# Patient Record
Sex: Male | Born: 1935
Health system: Southern US, Community
[De-identification: ages and names within clinical notes are randomized; demographics above are authoritative.]

## PROBLEM LIST (undated history)

## (undated) DIAGNOSIS — R001 Bradycardia, unspecified: Secondary | ICD-10-CM

## (undated) DIAGNOSIS — Z720 Tobacco use: Secondary | ICD-10-CM

## (undated) DIAGNOSIS — E785 Hyperlipidemia, unspecified: Secondary | ICD-10-CM

## (undated) DIAGNOSIS — N289 Disorder of kidney and ureter, unspecified: Secondary | ICD-10-CM

## (undated) DIAGNOSIS — I2510 Atherosclerotic heart disease of native coronary artery without angina pectoris: Secondary | ICD-10-CM

## (undated) DIAGNOSIS — I4819 Other persistent atrial fibrillation: Secondary | ICD-10-CM

## (undated) DIAGNOSIS — I447 Left bundle-branch block, unspecified: Secondary | ICD-10-CM

## (undated) DIAGNOSIS — I219 Acute myocardial infarction, unspecified: Secondary | ICD-10-CM

## (undated) DIAGNOSIS — I255 Ischemic cardiomyopathy: Secondary | ICD-10-CM

## (undated) DIAGNOSIS — I10 Essential (primary) hypertension: Secondary | ICD-10-CM

## (undated) DIAGNOSIS — M1990 Unspecified osteoarthritis, unspecified site: Secondary | ICD-10-CM

## (undated) DIAGNOSIS — R0600 Dyspnea, unspecified: Secondary | ICD-10-CM

## (undated) DIAGNOSIS — N183 Chronic kidney disease, stage 3 unspecified: Secondary | ICD-10-CM

## (undated) DIAGNOSIS — I5020 Unspecified systolic (congestive) heart failure: Secondary | ICD-10-CM

## (undated) HISTORY — DX: Hyperlipidemia, unspecified: E78.5

## (undated) HISTORY — PX: JOINT REPLACEMENT: SHX530

## (undated) HISTORY — DX: Disorder of kidney and ureter, unspecified: N28.9

## (undated) HISTORY — PX: TOTAL HIP ARTHROPLASTY: SHX124

## (undated) HISTORY — DX: Ischemic cardiomyopathy: I25.5

## (undated) HISTORY — DX: Unspecified systolic (congestive) heart failure: I50.20

## (undated) HISTORY — DX: Atherosclerotic heart disease of native coronary artery without angina pectoris: I25.10

## (undated) HISTORY — DX: Essential (primary) hypertension: I10

## (undated) HISTORY — DX: Chronic kidney disease, stage 3 unspecified: N18.30

## (undated) HISTORY — DX: Tobacco use: Z72.0

## (undated) HISTORY — DX: Other persistent atrial fibrillation: I48.19

## (undated) HISTORY — DX: Left bundle-branch block, unspecified: I44.7

## (undated) HISTORY — PX: TRANSURETHRAL RESECTION OF PROSTATE: SHX73

## (undated) HISTORY — DX: Bradycardia, unspecified: R00.1

---

## 2004-12-26 HISTORY — PX: CARDIAC CATHETERIZATION: SHX172

## 2005-11-19 ENCOUNTER — Inpatient Hospital Stay (HOSPITAL_COMMUNITY): Admission: EM | Admit: 2005-11-19 | Discharge: 2005-11-24 | Payer: Self-pay | Admitting: Emergency Medicine

## 2005-11-21 ENCOUNTER — Ambulatory Visit: Payer: Self-pay | Admitting: Cardiology

## 2005-12-05 ENCOUNTER — Ambulatory Visit: Payer: Self-pay | Admitting: Cardiology

## 2005-12-15 ENCOUNTER — Ambulatory Visit: Payer: Self-pay | Admitting: Cardiology

## 2006-02-09 ENCOUNTER — Ambulatory Visit: Payer: Self-pay | Admitting: Cardiology

## 2006-02-16 ENCOUNTER — Ambulatory Visit: Payer: Self-pay | Admitting: Cardiology

## 2006-04-27 ENCOUNTER — Ambulatory Visit: Payer: Self-pay | Admitting: Cardiology

## 2006-06-06 ENCOUNTER — Ambulatory Visit: Payer: Self-pay | Admitting: Cardiology

## 2006-06-06 ENCOUNTER — Ambulatory Visit: Payer: Self-pay

## 2006-11-28 ENCOUNTER — Ambulatory Visit: Payer: Self-pay | Admitting: Cardiology

## 2006-12-26 HISTORY — PX: CARDIAC CATHETERIZATION: SHX172

## 2007-03-28 ENCOUNTER — Other Ambulatory Visit: Payer: Self-pay

## 2007-03-28 ENCOUNTER — Ambulatory Visit: Payer: Self-pay | Admitting: Unknown Physician Specialty

## 2007-04-03 ENCOUNTER — Inpatient Hospital Stay: Payer: Self-pay | Admitting: Unknown Physician Specialty

## 2007-04-03 ENCOUNTER — Ambulatory Visit: Payer: Self-pay | Admitting: Cardiology

## 2007-04-04 ENCOUNTER — Other Ambulatory Visit: Payer: Self-pay

## 2007-04-05 ENCOUNTER — Other Ambulatory Visit: Payer: Self-pay

## 2007-04-08 ENCOUNTER — Other Ambulatory Visit: Payer: Self-pay

## 2007-04-10 ENCOUNTER — Encounter: Payer: Self-pay | Admitting: Internal Medicine

## 2007-04-23 ENCOUNTER — Ambulatory Visit: Payer: Self-pay | Admitting: Cardiology

## 2007-04-26 ENCOUNTER — Encounter: Payer: Self-pay | Admitting: Internal Medicine

## 2007-11-28 ENCOUNTER — Ambulatory Visit: Payer: Self-pay | Admitting: Unknown Physician Specialty

## 2007-11-28 ENCOUNTER — Other Ambulatory Visit: Payer: Self-pay

## 2007-12-06 ENCOUNTER — Inpatient Hospital Stay: Payer: Self-pay | Admitting: Unknown Physician Specialty

## 2007-12-12 ENCOUNTER — Ambulatory Visit: Payer: Self-pay | Admitting: Cardiovascular Disease

## 2007-12-12 ENCOUNTER — Ambulatory Visit: Payer: Self-pay | Admitting: Cardiology

## 2007-12-12 ENCOUNTER — Inpatient Hospital Stay (HOSPITAL_COMMUNITY): Admission: EM | Admit: 2007-12-12 | Discharge: 2007-12-16 | Payer: Self-pay | Admitting: Cardiovascular Disease

## 2007-12-12 ENCOUNTER — Encounter: Payer: Self-pay | Admitting: Cardiovascular Disease

## 2008-01-02 ENCOUNTER — Ambulatory Visit: Payer: Self-pay | Admitting: Cardiology

## 2008-01-28 ENCOUNTER — Ambulatory Visit: Payer: Self-pay | Admitting: Cardiology

## 2008-02-29 ENCOUNTER — Encounter: Payer: Self-pay | Admitting: Cardiology

## 2008-05-14 ENCOUNTER — Ambulatory Visit: Payer: Self-pay

## 2008-05-22 ENCOUNTER — Ambulatory Visit: Payer: Self-pay | Admitting: Cardiology

## 2008-06-12 ENCOUNTER — Ambulatory Visit: Payer: Self-pay | Admitting: Cardiology

## 2008-06-12 LAB — CONVERTED CEMR LAB
ALT: 11 units/L (ref 0–53)
AST: 13 units/L (ref 0–37)
Albumin: 4 g/dL (ref 3.5–5.2)
Alkaline Phosphatase: 55 units/L (ref 39–117)
BUN: 20 mg/dL (ref 6–23)
CO2: 22 meq/L (ref 19–32)
Calcium: 8.7 mg/dL (ref 8.4–10.5)
Chloride: 106 meq/L (ref 96–112)
Creatinine, Ser: 1.24 mg/dL (ref 0.40–1.50)
Glucose, Bld: 89 mg/dL (ref 70–99)
Potassium: 4.3 meq/L (ref 3.5–5.3)
Sodium: 141 meq/L (ref 135–145)
Total Bilirubin: 0.7 mg/dL (ref 0.3–1.2)
Total Protein: 6.8 g/dL (ref 6.0–8.3)

## 2008-08-27 ENCOUNTER — Ambulatory Visit: Payer: Self-pay | Admitting: Unknown Physician Specialty

## 2009-03-23 ENCOUNTER — Encounter: Payer: Self-pay | Admitting: Cardiology

## 2009-03-23 ENCOUNTER — Ambulatory Visit: Payer: Self-pay | Admitting: Cardiovascular Disease

## 2009-03-23 LAB — CONVERTED CEMR LAB
ALT: 14 units/L (ref 0–53)
AST: 14 units/L (ref 0–37)
Albumin: 4.1 g/dL (ref 3.5–5.2)
Alkaline Phosphatase: 54 units/L (ref 39–117)
Bilirubin, Direct: 0.1 mg/dL (ref 0.0–0.3)
Cholesterol: 98 mg/dL (ref 0–200)
HDL: 29 mg/dL — ABNORMAL LOW (ref >39)
Indirect Bilirubin: 0.5 mg/dL (ref 0.0–0.9)
LDL Cholesterol: 43 mg/dL (ref 0–99)
Total Bilirubin: 0.6 mg/dL (ref 0.3–1.2)
Total CHOL/HDL Ratio: 3.4
Total Protein: 6.9 g/dL (ref 6.0–8.3)
Triglycerides: 131 mg/dL (ref <150)
VLDL: 26 mg/dL (ref 0–40)

## 2009-04-07 ENCOUNTER — Ambulatory Visit: Payer: Self-pay | Admitting: Cardiology

## 2009-04-16 ENCOUNTER — Encounter: Payer: Self-pay | Admitting: Cardiology

## 2009-04-16 ENCOUNTER — Ambulatory Visit: Payer: Self-pay

## 2009-10-07 ENCOUNTER — Ambulatory Visit: Payer: Self-pay | Admitting: Cardiology

## 2009-10-07 DIAGNOSIS — I252 Old myocardial infarction: Secondary | ICD-10-CM

## 2009-10-07 DIAGNOSIS — I2510 Atherosclerotic heart disease of native coronary artery without angina pectoris: Secondary | ICD-10-CM

## 2009-10-07 DIAGNOSIS — I10 Essential (primary) hypertension: Secondary | ICD-10-CM | POA: Insufficient documentation

## 2009-10-07 DIAGNOSIS — E785 Hyperlipidemia, unspecified: Secondary | ICD-10-CM

## 2009-10-14 ENCOUNTER — Ambulatory Visit: Payer: Self-pay | Admitting: Cardiology

## 2009-10-14 ENCOUNTER — Encounter: Payer: Self-pay | Admitting: Cardiology

## 2009-10-16 LAB — CONVERTED CEMR LAB
ALT: 13 units/L (ref 0–53)
AST: 15 units/L (ref 0–37)
Albumin: 4 g/dL (ref 3.5–5.2)
Alkaline Phosphatase: 52 units/L (ref 39–117)
BUN: 26 mg/dL — ABNORMAL HIGH (ref 6–23)
CO2: 23 meq/L (ref 19–32)
Calcium: 9 mg/dL (ref 8.4–10.5)
Chloride: 106 meq/L (ref 96–112)
Cholesterol: 120 mg/dL (ref 0–200)
Creatinine, Ser: 1.58 mg/dL — ABNORMAL HIGH (ref 0.40–1.50)
Glucose, Bld: 92 mg/dL (ref 70–99)
HDL: 30 mg/dL — ABNORMAL LOW (ref >39)
LDL Cholesterol: 54 mg/dL (ref 0–99)
Potassium: 5 meq/L (ref 3.5–5.3)
Sodium: 141 meq/L (ref 135–145)
Total Bilirubin: 0.5 mg/dL (ref 0.3–1.2)
Total CHOL/HDL Ratio: 4
Total Protein: 6.5 g/dL (ref 6.0–8.3)
Triglycerides: 180 mg/dL — ABNORMAL HIGH (ref <150)
VLDL: 36 mg/dL (ref 0–40)

## 2009-11-10 ENCOUNTER — Encounter: Payer: Self-pay | Admitting: Cardiology

## 2010-06-15 ENCOUNTER — Encounter: Payer: Self-pay | Admitting: Cardiology

## 2010-06-22 ENCOUNTER — Encounter: Payer: Self-pay | Admitting: Cardiology

## 2011-01-25 NOTE — Letter (Signed)
Summary: [facility]   Imported By: Zenovia Jarred 08/09/2010 11:01:09  _____________________________________________________________________  External Attachment:    Type:   Image     Comment:   External Document

## 2011-05-10 NOTE — H&P (Signed)
[name] [name], MONNOT NO.:  [id number]   MEDICAL RECORD NO.:  [id number]          PATIENT TYPE:  INP   LOCATION:  2807                         FACILITY:  [facility]   PHYSICIAN:  Juanda Bond. Burt Knack, MD  DATE OF BIRTH:  1931/10/17   DATE OF ADMISSION:  12/12/2007  DATE OF DISCHARGE:                              HISTORY & PHYSICAL   PRIMARY CARDIOLOGIST:  Dr. Jenell Milliner.   PRIMARY CARE PHYSICIAN:  [name] Lab, MD.   ORTHOPEDIC SURGEON:  Dr. Jefm Bryant.   HISTORY OF PRESENT ILLNESS:  A 77 year old, Caucasian male with known  history of coronary artery disease, non-ST elevated MI in 2006 with a  Cypher stent to the mid LAD per catheterization in 2006 who presents to  [name] with acute anterolateral MI.  The patient began to  have left-sided chest pain while getting into the bathtub this a.m.  The  patient of note has had recent right hip replacement approximately 7  days ago and is unable to shower and was getting in and out of the  bathtub causing some discomfort in his chest.  The patient had been off  of Plavix and aspirin and been taking subcu Lovenox postoperatively.  He  had not taken his Lovenox this morning.  The patient admits to having  some twinges in the left side of his chest a couple of days ago that  were transient with no associated symptoms.  This morning, the left-  sided chest pain became very severe with radiation to the left arm with  diaphoresis and mild shortness of breath.  EMS was called.  EKG revealed  ST elevation in leads V2 through V5 with reciprocal changes in 1, 2, and  3.  The patient was given sublingual nitroglycerin, 325 mg of baby  aspirin, and morphine by EMS and brought emergently to [name] at [facility].  The patient was seen and examined by  myself and Dr. Sherren Mocha in the Cath [name].   REVIEW OF SYSTEMS:  Positive for diaphoresis, chest pain, shortness of  breath, and chronic right hip  pain.   PAST MEDICAL HISTORY:  1. Coronary artery disease.      a.     Stent of the mid LAD with Cypher stent to the mid section in       November 2006.      b.     Cardiac catheterization at that time revealed a 40% ostial       LAD, 95% mid LAD and second diagonal, 70% second diagonal branch,       40% posterolateral vessels of the circumflex, and 40% right       coronary artery mid and distal vessels.  2. Hypertension.  3. Hyperlipidemia.  4. Mild renal insufficiency with baseline creatinine of 1.5.  5. Asymptomatic sinus bradycardia.  6. BPH.   PAST SURGICAL HISTORY:  TURP and status post right hip replacement.   SOCIAL HISTORY:  He lives in [place] with his wife.  He is retired  from [employer].  He is married.  He stopped smoking  40 years ago.  No ETOH.   CURRENT MEDICATIONS:  1. Aspirin 325 once a day.  2. Plavix 75 mg once a day (currently on hold).  3. Norvasc 5 mg daily.  4. Toprol XL 25 mg daily.  5. Vytorin 10/20 once a day.  6. Lovenox subcutaneously once a day.   ALLERGIES:  No known drug allergies.   PHYSICAL EXAMINATION:  VITAL SIGNS:  Blood pressure 140/74, pulse 89,  respirations 12.  The patient weighed 190 pounds, height 5 feet 7.  GENERAL:  He is awake, alert, oriented, and complaining of severe chest  pain.  HEENT:  Head is normocephalic atraumatic.  Eyes PERRLA.  Mucous  membranes are now pink and moist.  Tongue is midline.  NECK:  Supple with no JVD.  No carotid bruits appreciated at present.  CARDIOVASCULAR:  Tachycardic with regular rhythm and rate without  murmurs, rubs, or gallops at present.  LUNGS:  Have some bibasilar crackles without wheezes or rhonchi.  ABDOMEN:  Soft and nontender.  There is a ventral hernia noted.  He has  2+ bowel sounds.  EXTREMITIES:  Without clubbing, cyanosis, or edema.  There is fresh  right hip incision noted status post right hip replacement.  NEURO:  Cranial nerves II-XII are grossly intact.   Labs are pending.   EKG revealing ST elevation V1 through V5 with  reciprocal changes in 1, 2, and 3.   IMPRESSION:  1. ST elevated myocardial infarction anterolateral.  2. Status post right hip replacement 1 week ago.  3. Known history of coronary artery disease with stent to the left      anterior descending (mid) with Cypher stent in November 2006 with      circumflex and right coronary artery disease nonobstructively at      that time.  4. History of hypertension.  5. Mild renal insufficiency with baseline creatinine of 1.5.  6. Asymptomatic sinus bradycardia.   PLAN:  The patient is being seen emergently at [name] for emergent cardiac catheterization and PCI to  correct culprit lesion occlusion.  More recommendations per Dr. Burt Knack.  Please see his thorough cardiac catheterization note for more details.      [name] [name]. [name], NP      Juanda Bond. Burt Knack, MD  Electronically Signed    KML/MEDQ  D:  12/12/2007  T:  12/12/2007  Job:  [id number]   cc:   [name] Kail, MD  [name] Lab, MD

## 2011-05-10 NOTE — Assessment & Plan Note (Signed)
[facility] OFFICE NOTE   NAME:[name], [name] PHIBBS                      MRN:          [id number]  DATE:01/28/2008                            DOB:          1931-03-12    [name] [name] returns today for close follow-up.  He had an acute anterior  wall infarct as outlined on my note January 02, 2008, December 06, 2007.  He had a bare metal stent placed secondary to stent thrombosis from a  previous drug-eluting stent.   He has done extremely well.  He is not having any symptoms of angina or  ischemia.   His biggest problem is orthopedic issues.  His hip surgery has healed up  well but he is now having significant left knee pain.   He remains on plastic Plavix and aspirin, will stay on those for a year.  He is also on Carvedilol 6.25 b.i.d., Norvasc 5 mg a day, Vytorin 10/20.   He is going to see Dr. Caryl Comes soon for blood work.  He will need a follow-  up CBC secondary to his postoperative anemia.  He is on iron  replacement.   Blood pressure is 124/70 his pulse 58 and regular.  Weight is 186.  HEENT:  Unchanged.  LUNGS:  Clear.  HEART:  Reveals a nondisplaced PMI.  Normal S1-S2.  No gallop.  ABDOMEN:  Soft, good bowel sounds.  EXTREMITIES:  No edema.  Pulses are present.  NEURO:  Exam is intact.   ASSESSMENT/PLAN:  [name] [name] is doing well.  He will need a follow-up  CBC with Dr. Caryl Comes and he can finish out his iron therapy.  He will stay  on Plavix and aspirin for a year as outlined above.  We will obtain  adenosine Myoview to follow-up on his stent and post MI in April.  I  will see him back shortly after that.     Thomas C. Verl Blalock, MD, [facility]  Electronically Signed    TCW/MedQ  DD: 01/28/2008  DT: 01/28/2008  Job #: [id number]   cc:   Dr. Ramonita Lab

## 2011-05-10 NOTE — Assessment & Plan Note (Signed)
[facility] OFFICE NOTE   NAME:[name] [name], [name] [name]                      MRN:          [id number]  DATE:04/07/2009                            DOB:          11/03/1931    [name] comes in today.  He was supposed to be here in December, but  he is a bit late.   He seems somewhat lackadaisical today, has actually gained about 14  pounds.  He is not depressed.  He has been getting out and playing some  golf.   He complains of some funny feelings in his left upper arm at rest.  He  has not had any exertional angina or ischemic equivalents.  He denies  orthopnea, PND, or peripheral edema.  He is not having any major dyspnea  on exertion.   He is over a year out from his acute anterior wall infarct at the time  of hip surgery when his Plavix was discontinued.  A bare-metal stent  placed at that time.  Followup stress Myoview showed an EF of 38% with  no ischemia, but a fairly large anterior apical scar.  His EF had been  about 40-45%, but on the stress Myoview was 38%.   MEDICATIONS:  1. Plavix 75 mg per day.  2. Norvasc 7.5 mg per day.  3. Carvedilol 6.25 b.i.d.  4. Aspirin 325 mg daily.  5. Lisinopril 10 mg per day.  6. Simvastatin 40 mg at bedtime.  7. Nitroglycerin, which he has not refilled in over a year.   PHYSICAL EXAMINATION:  VITAL SIGNS:  Blood pressure is 114/60, his pulse  57 and regular.  His electrocardiogram shows sinus brady with a left  anterior fascicular block and sort of a nonspecific block in general.  There has been no significant change.  His STs have more or less  normalized in the anterior septal leads except for some biphasic Ts.  SKIN:  Warm and dry.  GENERAL:  He is alert and oriented x3.  HEENT:  Ruddy complexion, otherwise normal.  NECK:  Supple.  There is no JVD.  Carotid upstrokes were equal  bilaterally without bruits.  Thyroid is not enlarged.  Trachea is  midline.  LUNGS:   Clear to auscultation and percussion.  HEART:  Poorly-appreciated PMI.  Soft S1 and S2.  No gallop.  ABDOMEN:  Soft, good bowel sounds.  No midline bruit.  No hepatomegaly.  EXTREMITIES:  No cyanosis, clubbing, or edema.  Pulses are intact.  NEURO:  Intact.  MUSCULOSKELETAL:  Chronic arthritic changes.   [name] is stable, though I am concerned about his weight gain as  well as his lackadaisical attitude about his health care.  The fact that  he has not had his nitroglycerin renewed and the fact that he is late  for showing back up for his annual visit is disturbing.  I do not think  his discomfort is angina or ischemic equivalent.   PLAN:  1. We renewed his Carvedilol today at 6.25 b.i.d.  2. We renewed his nitroglycerin.  3. A 2-D echocardiogram to  follow up on his LV function and also      evaluate for any mitral valve disease.   Assuming he is stable, I will see him back in 6 months.     Thomas C. Verl Blalock, MD, [facility]  Electronically Signed    TCW/MedQ  DD: 04/07/2009  DT: 04/08/2009  Job #: [id number]   cc:   Ramonita Lab, III, MD

## 2011-05-10 NOTE — Assessment & Plan Note (Signed)
[facility] OFFICE NOTE   NAME:[name], [name] BUKHARI                      MRN:          [id number]  DATE:05/22/2008                            DOB:          02-Nov-1931    [name] [name] returns today for followup.   He is now 5 months out from his acute anterior wall infarct, status post  bare-metal stent.  His previous notes have outlined this is in the  setting of discontinuation of Plavix for hip surgery.  He had a previous  drug-eluting stent in that vessel.   I did a stress Myoview, May 14, 2008.  His ejection fraction was 38%  with anterior apical and inferior apical hypokinesia.  He had a fixed  defect in that area as well as a small inferior wall scar.  There was no  ischemia.  His previous ejection fraction was about 40% to 45%.  This  was probably consistent.   He is having no symptoms of congestive heart failure at present.  He  specifically denies any orthopnea, PND or peripheral edema.  His weight  is up and he is trying to lose.  He is up from 186 to 193.   His blood pressure is excellent today at 128/75.  His pulse is 50 and  regular.  He denies any syncope or presyncope.  NECK:  Shows no JVD.  LUNGS:  Clear.  HEART:  Reveals a regular rate and rhythm, no gallop.  ABDOMINAL EXAM:  Slightly protuberant, good bowel sounds.  There is no  organomegaly.  Bowel sounds are present.  EXTREMITIES:  Reveal no cyanosis, clubbing, or edema.  Pulses are  present.  NEURO EXAM:  Intact.  SKIN:  Unremarkable.  MUSCULOSKELETAL:  Chronic arthritic changes.   I have had a nice chat with [name] [name] today.  We will add lisinopril  10 mg a day for reducing progressive left ventricular systolic  dysfunction and congestive heart failure.  He will remain on his  carvedilol 6.25 b.i.d.  In addition, he will continue aspirin and  Plavix.  I will see him back in December 2009.  We can make a final  decision about continuing  Plavix at that time, but both of [place] are  leaning towards continuing to do so with his past history.     Thomas C. Verl Blalock, MD, [facility]  Electronically Signed    TCW/MedQ  DD: 05/22/2008  DT: 05/22/2008  Job #: [id number]   cc:   Tama High, III, MD

## 2011-05-10 NOTE — Assessment & Plan Note (Signed)
[facility] OFFICE NOTE   NAME:[name] [name]                      MRN:          [id number]  DATE:01/02/2008                            DOB:          1931/07/31    [name] [name] returns today after being discharged from the hospital on  December 16, 2007, status post hip replacement, acute anterior  myocardial infarction.  Of note, his Plavix had been stopped  appropriately for his hip replacement on December 06, 2007.  It had been  almost 2 years since his previous stent.   His catheterization showed a stent thrombosis in the LAD, nonobstructive  disease in the circumflex in the right coronary artery.  He had a bare-  metal stent placed.  His EF was about 45% with anterior apical wall  motion abnormalities.  There was a question on echo of a possible apical  thrombus.   We planned to put him on Coumadin, but he had guaiac positive stools.  Guaiac cards have been returned since discharge, and they are negative.  He is on iron replacement, and a proton pump inhibitor.   We also were not too enthralled with the idea of having him on Coumadin  with Plavix and aspirin.   He required transfusion in the hospital.  Since discharge, he has done  well, except for some sharp pain in his left back yesterday.  He did  some unusual movement replacing a computer modem, and probably pulled a  muscle.  It is better today.  He denies any GU symptomatology.  He does  have a history of kidney stones, however.   CURRENT MEDICATIONS:  1. Carvedilol 6.25 mg p.o. b.i.d.  2. Protonix 40 mg a day.  3. Aspirin 325 mg a day.  4. Iron 325 mg a day.  5. Colace 100 mg 1 or 2 a day.  6. Plavix 75 mg a day.  7. Norvasc 5 mg a day.  8. Vytorin 10/20 daily.  9. Nitroglycerin p.r.n.   He looks remarkably well today.  Skin color is good.  He is alert and oriented x3.  His blood pressure is 116/68.  His pulse is 66.  He is in sinus  rhythm.  EKG:  Shows evolutionary changes of an anterior apical infarct.  His weight is 191, down 4.  HEENT:  Unchanged.  Carotid upstrokes were equal bilaterally without bruits.  There was no  JVD.  Thyroid is not enlarged.  Trachea is midline.  LUNGS:  Clear to auscultation.  HEART:  Reveals a nondisplaced, but poorly appreciated PMI.  There is no  S3 gallop.  S2 splits physiologically.  ABDOMINAL EXAM:  Protuberant with good bowel sounds.  There is no  tenderness.  Catheterization sites are healed.  EXTREMITIES:  Reveal 1+ on the right, trace on the left.  Pulses are  intact.  NEUROLOGIC EXAM:  Intact.   ASSESSMENT:  [name] [name] is doing well.  I have made no changes in his  medical program.  We will plan on seeing him back in 4 weeks.  He has  been advised  to stay on the Plavix and the aspirin for a year together,  then aspirin thereafter.  In addition, he needs to be on the iron for a  couple of months.  He has been educated on the warning signs of melena  as opposed to just dark stools from iron.     [name] C. Verl Blalock, MD, [facility]  Electronically Signed    TCW/MedQ  DD: 01/02/2008  DT: 01/02/2008  Job #: [id number]   cc:   [name] [name], M.D.

## 2011-05-10 NOTE — Discharge Summary (Signed)
[name] [name], [name] [name]               ACCOUNT NO.:  [id number]   MEDICAL RECORD NO.:  [id number]          PATIENT TYPE:  INP   LOCATION:  [id number]                         FACILITY:  [place]   PHYSICIAN:  Marijo Conception. Wall, MD, FACCDATE OF BIRTH:  1931-04-20   DATE OF ADMISSION:  12/12/2007  DATE OF DISCHARGE:  12/16/2007                               DISCHARGE SUMMARY   PROCEDURES:  1. Cardiac catheterization.  2. Coronary arteriogram.  3. Left ventriculogram.  4. Percutaneous transluminal coronary angioplasty intravascular      ultrasound and stenting of the left anterior descending.  5. 2-D echocardiogram.   TIME AT DISCHARGE:  48 minutes.   HOSPITAL COURSE:  [name] [name] is a 77 year old male with a history of  coronary artery disease.  He was last seen by cardiology in April 2008  and was doing well.  He had a hip replacement on December 06, 2007.  He  was partial weightbearing and was getting into the bathtub on the day of  admission when he developed chest pain.  He had been off Plavix and  aspirin, and on subcutaneous Lovenox, but had not taken his Lovenox that  day.  A left-sided chest pain became severe, and EMS was called.  He had  ST elevation in leads V3 through V5, and he was brought urgently to the  hospital and taken to the cath lab.   The cardiac catheterization showed an LAD with stent thrombosis and mild  nonobstructive disease in the circumflex and RCA.  He had intravascular  ultrasound as well as percutaneous intervention to the LAD with a bare-  metal stent, reducing the stenosis to 0.  His EF was 40% to 45% by cath.  An echocardiogram was ordered to further assess this.   The echocardiogram showed an EF of 40% to 45% with periapical and septal  wall motion abnormalities.  There was also a possible apical thrombus.  His PAS was 53.   Initially, the plan was for heparin and Coumadin, but [name] [name] had  guaiac-positive stool and his hemoglobin was remaining very low.   At  discharge, he was 9.6 with a hematocrit of 28.  After the heart  catheterization, when his hemoglobin initially dropped, he was  transfused 1 unit.  His hemoglobin at this time is holding steady at  approximately 9.5.  She was discharged with stool guaiac cards, and is  to use these, and turn them into his family physician.  Of note, because  of hyperglycemia, a hemoglobin A1c was checked, but was within normal  limits.  The hyperglycemia resolved.   A lipid profile was checked and showed a total cholesterol of 100,  triglycerides 97, HDL 22, LDL 59.  Initially, he was placed on Lipitor  80 mg a day, but it was felt that he could continue on the Vytorin 10/20  q. day, and discuss with Dr. Verl Blalock as an outpatient if therapy needs to  be changed to try and increase his HDL.  Additionally, he had an iron  level drawn which was low at 39.  Ferritin was within normal limits  at  120 and folate was within normal limits at 9.90.  He was started on oral  iron, and is to follow up as an outpatient.   [name] [name] was seen by cardiac rehab and is to have home PT and OT as  well as a home health RN.  He will follow up cardiac rehab once his  activity level improves somewhat.   On December 16, 2007, [name] [name] had been seen by cardiac rehab and was  having no chest pain or shortness of breath with ambulation.  His beta  blocker had been changed from metoprolol to Coreg, and he was initially  to be started on an ACE inhibitor, but his systolic blood pressure at  times ran less than 100.  It was decided not to add an ACE inhibitor at  this time, but follow him up as an outpatient, and considered it at that  time.  On December 16, 2007, [name] [name] was evaluated by Dr. Verl Blalock and  considered stable for discharge.   DISCHARGE INSTRUCTIONS:  His activity level is to be increased slowly.  He is to stick to a low-sodium, heart-healthy diet.  He is to call our  office for any problems with the incision  of the cath site.  He is to  follow up with Dr. Verl Blalock after the first of the year, and with Dr. Caryl Comes  as well as Dr. Jefm Bryant.   DISCHARGE MEDICATIONS:  1. Carvedilol 6.25 mg b.i.d.  2. Protonix 40 mg a day.  3. Aspirin 325 mg q. day.  4. Iron 325 mg q. day.  5. Colace 100 mg 1 or 2 tabs daily.  6. Plavix 75 mg q. day.  7. Norvasc 5 mg daily.  8. Vytorin 10/22 daily.  9. Nitroglycerin sublingual p.r.n.   He is to use and turn in his stool guaiac cards.  He is to hold Toprol  XL or metoprolol.      Rosaria Ferries, PA-C      Marijo Conception. Verl Blalock, MD, [facility]  Electronically Signed    RB/MEDQ  D:  12/16/2007  T:  12/17/2007  Job:  [id number]   cc:   Dr. Klein/[facility]  Percell [place]

## 2011-05-10 NOTE — Cardiovascular Report (Signed)
NAMETREVEYON, REPLOGLE NO.:  [id number]   MEDICAL RECORD NO.:  [id number]          PATIENT TYPE:  INP   LOCATION:  2903                         FACILITY:  [facility]   PHYSICIAN:  Juanda Bond. Burt Knack, MD  DATE OF BIRTH:  03/16/1931   DATE OF PROCEDURE:  12/12/2007  DATE OF DISCHARGE:                            CARDIAC CATHETERIZATION   PROCEDURE:  Left heart catheterization, selective coronary angiography,  left ventricular angiography, PTCA, IVUS and stenting of the LAD.   INDICATIONS:  Mr. Gell is a 77 year old gentleman who has coronary  artery disease.  He had a severe LAD stenosis treated with a Cypher drug-  eluting stent back in 2006.  He has done well in the interim.  He has  been on aspirin and Plavix since that time.  He underwent a right total  hip replacement approximately 6 days ago.  He has been off of aspirin  and Plavix for his hip surgery.  On the morning of the procedure, he  developed severe substernal chest pain.  EMS was called and his EKG  showed anterior ST elevation. A code STEMI was called and he was brought  directly to cardiac cath lab.  Upon arrival, he had ongoing severe chest  pain with marked injury current across his anterior leads. Emergency  cardiac catheterization was performed.  The risks and indications of the  procedure were explained to the patient.  Written consent was not  obtained as this was done as an emergency procedure.  The left groin was  prepped, draped, anesthetized with 1% lidocaine. Using the modified  Seldinger technique, a 6-French sheath was placed in the left femoral  artery. A JR-4 catheter was initially advanced into the central aorta,  but I was unable to cannulate the right coronary artery. The artery was  visualized well enough to see that it was patent.  A 6-French XB 3.5 cm  LAD guide catheter was inserted into the left coronary artery.  This  demonstrated total occlusion of the LAD just after the first  septal  perforator.  Heparin and Integrilin were used for anticoagulation.  A  cougar guidewire was passed beyond the area of occlusion into the distal  LAD and the LAD was dilated with a 2.5 x 20-mm Maverick balloon up to 10  atmospheres on two inflations.  This restored flow in the vessel. There  was a large residual thrombus burden throughout the stented segment and  also just proximal to the stented segment in the LAD.  At that point, I  decided to use intravascular ultrasound to evaluate the stent and make  sure that it was well opposed throughout.  The stent appeared to be  opposed to vessel wall throughout but there was significant thrombus  throughout the stented segment as well as proximal to this.  The stent  appeared to be approximately 3 mm in diameter.  I elected to dilate the  stent with a 3.0 x 20-mm Quantum Maverick which was taken to 20  atmospheres.  Following dilatation with the Quantum Maverick, there was  a residual filling defect just proximal  to the previously placed stent.  There is a large diagonal from this that arises from the stented segment  that has tight stenosis at the ostium but there was TIMI III flow  present.  I repeated an IVUS that confirmed significant thrombus  proximal to the stent.  I elected to treat this with a 4 x 12-mm  Liberte' stent.  This was deployed at 12 atmospheres.  The distal end of  the stent was placed just proximal to the diagonal branch and overlapped  the previously placed stent.  Following stenting, there was an excellent  angiographic result.  There was no residual thrombus seen by  angiography.  The diagonal remained patent.  A final IVUS run  demonstrated a widely patent well apposed stent with no significant  residual thrombus. Final angiography then demonstrated TIMI III flow in  the LAD and an excellent angiographic result.  The diagonal branch did  have a tight ostial narrowing but had TIMI III flow and I elected not to   dilate that area.  The patient tolerated the procedure well and was  nearly chest pain free at the conclusion.  At that point, an AR-1  catheter was selectively engaged into the right coronary artery and  right coronary angiography was performed. A pigtail catheter was placed  in the left ventricle and left ventriculography was performed. At the  completion of procedure, the patient was transferred to the holding  area.  He will be admitted to the CCU and observed carefully and treated  with post MI therapy.  He tolerated the procedure well and had no  immediate complications.   FINDINGS:  Aortic pressure 127/63 with a mean of 92, left ventricular  pressure 125/32.   CORONARY ANGIOGRAPHY:  The left main stem has mild stenosis with  approximate 30% ostial stenosis.  The left main bifurcates into the LAD  and left circumflex.   The LAD has mild disease in its ostium.  The LAD is totally occluded  just beyond the first diagonal branch at the proximal aspect of the  stent.  There is no antegrade flow in the LAD.  There is no collateral  filling of the mid and distal LAD.   The left circumflex is patent. It is a large vessel that courses down  and supplies two large left posterolateral branches.  There are only two  small OM branches.  There is no significant angiographic stenosis in the  left circumflex.   The right coronary artery has a heavy rim of calcium at its ostium in  the right coronary cusp.  There is mild nonobstructive plaque in the mid  portion of the right coronary artery.  At the junction of the mid and  distal vessel, there is a 40% stenosis distally. The artery bifurcates  into a PDA branch and posterolateral branch.  There is no significant  stenosis of those branch vessels.   Left ventricular function was assessed by ventriculography.  The apical  portion of the LAD was poorly visualized. The basal portions of the LAD  were hyperdynamic.  The anteroapex appeared  hypokinetic.  The LVEF is  estimated at 40%.   ASSESSMENT:  1. Anterior myocardial infarction secondary to very late stent      thrombosis.  2. Mild nonobstructive left circumflex and RCA stenosis.  3. Successful PCI of the LAD with PTCA and bare metal stenting under      IVUS guidance.  4. Moderate LV dysfunction.   PLAN:  Mr. Benzon will require aspirin and Plavix indefinitely.  He  will be monitored closely in the CCU.  His initial hemoglobin was 8.5 at  the beginning of the procedure.  Will have to watch his hemoglobin  closely and make a decision whether he will need to be transfused.  His  anemia is likely related to his hip surgery last week.  I will check a 2-  D echo to reassess LV function.      Juanda Bond. Burt Knack, MD  Electronically Signed     MDC/MEDQ  D:  12/13/2007  T:  12/13/2007  Job:  [id number]   cc:   Thomas C. Verl Blalock, MD, [facility]  Dr. Jefm Bryant, orthopedist

## 2011-05-13 NOTE — Cardiovascular Report (Signed)
NAMEPINCHUS, DISCHLER NO.:  [id number]   MEDICAL RECORD NO.:  [id number]          PATIENT TYPE:  INP   LOCATION:  2907                         FACILITY:  [facility]   PHYSICIAN:  Eustace Quail, M.D. [facility] DATE OF BIRTH:  [birth year]   DATE OF PROCEDURE:  11/21/2005  DATE OF DISCHARGE:                              CARDIAC CATHETERIZATION   HISTORY OF PRESENT ILLNESS:  [name] [name] is 77 years old and has no prior  history of known heart disease.  He was admitted to the hospital for  prolonged chest pain and had positive enzymes consistent with non-ST-  elevation infarction.  He also had ECG changes and anterolateral wall  ischemia.  He is scheduled for evaluation with angiography today and had  been started on heparin and Integrilin drip and also been loaded with  Plavix.   DESCRIPTION OF PROCEDURE:  The patient procedure was performed of the right  femoral artery using an arterial sheath and 6-French preformed coronary  catheters.  A front wall arterial puncture was performed and Omnipaque  contrast was used.  After completion of the diagnostic study, we made a  decision to proceed with intervention on the lesion in the left anterior  descending artery.   The patient was on an Integrilin drip and was given additional heparin  following an ACT greater than 200 seconds.  We used a Q4 6-French guiding  catheter with side holes and a Prowater wire.  We crossed the lesion in the  mid LAD with a wire without difficulty.  We predilated with 2.5 x 15 mm  Maverick balloon performing two inflations up to 8 atmospheres for 30  seconds.  This resulted in chest pain and transient TIMI-2 flow.  We gave  intracoronary verapamil and nitroglycerin.  His pain continued although he  remained otherwise stable.  We then deployed a 2.75 x 23 mm Cypher stent in  the mid LAD crossing the moderate size diagonal branch.  We deployed this at  one inflation of 15 atmospheres for 30 seconds.  This  reestablished TIMI-3  flow both in the distal LAD and the diagonal branch which was crossed by the  stent.  We then post dilated with a 3.0 x 20 mm Quantum Maverick performing  two inflations up to 16 atmospheres for 30 seconds.  Final diagnostic  studies then performed through the guiding catheter.   The patient had chest pain after the initial balloon inflation which  persisted until the end of the procedure and was graded about 6 on a scale  of 10.  We saw no microscopic embolization although we did see transient  TIMI-2 flow.  The final flow was TIMI-3 flow in the both the LAD and the  diagonal branch.   RESULTS:  LEFT MAIN CORONARY ARTERY:  The left main coronary artery is free  of significant disease.   LEFT ANTERIOR DESCENDING ARTERY:  The left anterior descending artery gave  rise to two septal perforators and two diagonal branches.  There was 40%  ostial stenosis in the LAD.  There was 95% stenosis in the mid LAD  just  after the second diagonal branch.  There was 70% stenosis in the second  diagonal branch.   CIRCUMFLEX:  The circumflex artery gave rise to a first marginal branch and  two posterolateral branches.  There was 40% narrowing in one of the two  posterolateral branches.   RIGHT CORONARY:  The right coronary artery was a moderate size vessel and  gave rise to a conus branch, right ventricular branch, posterior descending  branch and three posterolateral branches.  There were some irregularities in  the coronary artery and there was 40% narrowing in the mid to distal  vessels.   LEFT VENTRICULOGRAM:  The left ventriculogram performed in the RAO  projection showed good wall motion with no areas of hypokinesis.  The  estimated ejection fraction was 55%.   Following stenting of the lesion in the mid LAD, the stenosis improved from  95% to less than 10%.  Flow was TIMI-3 before and after intervention. The  ostium of the diagonal branch was pinched from 20% to about  80% but the flow  remained TIMI-3 throughout and we elected not to intervene on this lesion.   The aortic pressure was 134/55, mean of 74, left ventricular pressure was  134/23.   CONCLUSION:  1.  Coronary artery disease with 40% ostial and 95% mid stenosis in the left      anterior descending artery with 70% stenosis in the second diagonal      branch, 40% narrowing in the first posterolateral branch of the      circumflex artery, 40% narrowing in the mid to distal right coronary      artery and normal LV function.  2.  Successful PCI of the lesion in the mid left anterior descending artery      using a Cypher drug-eluting stent with improvement in narrowing from 95%      to less than 10%.   DISPOSITION:  The patient returned to his room for further observation.  The  patient remained stable.  Will plan discharge tomorrow.  The fact that he  had chest pain after initial balloon inflation and transient TIMI-2 flow  suggests microscopic distal embolization and it is possible that his CK's  may bump.           ______________________________  Eustace Quail, M.D. [facility]     BB/MEDQ  D:  11/21/2005  T:  11/21/2005  Job:  [id number]   cc:   Dr. Caryl Comes at  [facility]. Wall, M.D.  [address] [place]  [place] [address]

## 2011-05-13 NOTE — Cardiovascular Report (Signed)
[name] [name], [name] [name] NO.:  [id number]   MEDICAL RECORD NO.:  [id number]          PATIENT TYPE:  INP   LOCATION:  2907                         FACILITY:  [facility]   PHYSICIAN:  Satira Sark, M.D. LHCDATE OF BIRTH:  Dec 15, 1931   DATE OF PROCEDURE:  DATE OF DISCHARGE:  11/24/2005                              CARDIAC CATHETERIZATION   PRIMARY CARDIOLOGIST:  Dr. Jenell Milliner.   INDICATIONS:  [name] [name] is a 77 year old male who presented to the  hospital back on November 19, 2005 with evidence of a non-ST elevation  myocardial infarction.  He ultimately underwent cardiac catheterization  revealing a 95% mid left anterior descending stenosis which was treated with  a drug-eluting stent by Dr. Olevia Perches on November 21, 2005.  He has done well  in general, although has had some recurrent episodes of chest and left arm  discomfort since that time.  He is referred now for relook angiography to  assess the stent patency and stability of residual anatomy.  Risks and  benefits were explained to the patient and informed consent was obtained  prior to proceeding.  Of note, he does have renal insufficiency with  creatinine of 1.4.   PROCEDURES PERFORMED:  Selective coronary angiography.   ACCESS AND EQUIPMENT:  The area about the left femoral artery was  anesthetized with 1% lidocaine. A 6-French sheath was placed in the left  femoral artery via modified Seldinger technique.  A Wholey wire was used  initially to advance catheters, however ultimately all exchanges were made  over a J-wire.  Standard preformed 6-French JL-5 and JR-4 catheters were  used for selective coronary angiography.  A total of 40 mL of contrast was  used.  The patient tolerated the procedure well without immediate  complications.   HEMODYNAMICS:  Aorta 126/68 mmHg.   ANGIOGRAPHIC FINDINGS:  1.  The left main coronary artery is free of significant flow-limiting      coronary atherosclerosis and gives  rise to left anterior descending and      circumflex coronary arteries.  2.  The left anterior descending is medium in caliber and provides three      diagonal branches, the second of which is the largest and bifurcates.  A      stent is visualized in the mid portion of the left anterior descending      crossing the ostium of the second diagonal branch and this stent is      widely patent.  The diagonal branch itself is diseased at approximately      50-60% the ostium, as well as 60% in the mid vessel segment.  This does      not look to be significantly different compared to previous angiography.  3.  The circumflex coronary artery is a medium caliber vessel with four      obtuse marginal branches. The second obtuse marginal branch is fairly      small and has TIMI2 flow with perhaps ostial disease, although this does      not look to be significantly different compared to the most recent  angiography films, and this is a very small vessel.  The third obtuse      marginal branch has a 40% stenosis which is stable.  4.  The right coronary artery is a medium caliber vessel with two right      ventricular marginal branches and posterior descending branch.  There is      20% proximal stenosis and 40% distal stenosis.   DIAGNOSIS:  Patent mid left anterior descending stent with otherwise stable  coronary anatomy compared to the most recent angiography films.   DISCUSSION:  I reviewed the results with the patient and his available  family.  The plan at this point will be to observe overnight, hydrate, and  follow-up BMET in the morning anticipating discharge soon.  He would  otherwise be treated medically for his residual coronary artery disease, and  the possibility of a long-acting nitrate could be considered.           ______________________________  Satira Sark, M.D. [facility]     SGM/MEDQ  D:  11/23/2005  T:  11/24/2005  Job:  [id number]   cc:   Thomas C. Wall, M.D.  [address] [place]  [place] [address]

## 2011-05-13 NOTE — H&P (Signed)
NAMEUTSAV, KAEHLER               ACCOUNT NO.:  [id number]   MEDICAL RECORD NO.:  [id number]          PATIENT TYPE:  INP   LOCATION:  1826                         FACILITY:  [facility]   PHYSICIAN:  Marijo Conception. Wall, M.D.   DATE OF BIRTH:  January 26, 1931   DATE OF ADMISSION:  11/19/2005  DATE OF DISCHARGE:                                HISTORY & PHYSICAL   CHIEF COMPLAINT:  Chest discomfort with arm aching while playing golf today.   HISTORY OF PRESENT ILLNESS:  [name] is a pleasant 77 year old  white male with no previous cardiac history.  He had the onset of chest  discomfort with neck and left arm discomfort while playing golf today around  11 o'clock a.m.  He came home, took a couple of aspirin but had no relief.  He had his family bring him to the [facility] via private  vehicle.  He arrived here around 12:47.   His initial blood pressure was 171/90.  His pulse was 76 and regular.  Respirations were listed as 12.  He was afebrile.  O2 saturation was 97%.  EKG shows a new intraventricular conduction delay with left axis deviation,  ST segment depression in the lateral leads and some ST segment minor  nondiagnostic ST elevation inferiorly.   He was treated with IV nitroglycerin and aspirin.  His symptoms continued to  hurt in his left arm.   Dr. Lajean Saver in the emergency department called me around 3 o'clock.  At  that time, his EKG was unchanged.  His blood pressure, however, was still  high at 167/80-90, and his heart rate was in the 90s, sinus rhythm.  We have  given him intravenous metoprolol 5 mg x 2 with his blood pressure and heart  rate normalizing.  His symptoms totally resolved.  He says he feels back to  normal.   He also has been given intravenous heparin 5000 IV with a drip per pharmacy.  IV Integrilin is being given at this present time.  He continues on IV  nitroglycerin.   His point of care markers x2, one at 1404, one at 1550, are  negative.  EKG  again was repeated at 1606 without any further changes from admission EKG.  Previous EKG compared to was in 2000.   ALLERGIES:  He has no known drug allergies.   MEDICATIONS:  1.  He takes Norvasc 5 mg a day.  2.  Diclofenac 75 mg b.i.d.   FAMILY HISTORY:  He has a family history of coronary disease, his father  having a myocardial infarction at age 37.  He has several uncles who have  had MIs in their 17s as well.   PAST SURGICAL HISTORY:  He has had a TURP.  He has BPH.   SOCIAL HISTORY:  He lives in [place] with his wife.  She is here today.  He quit smoking at age 71.  He smoked for about 15 years.  He does not drink  alcohol.  He enjoys playing golf.  He is retired from [employer].   FAMILY HISTORY:  As above.   REVIEW OF SYSTEMS:  No history of bleeding diathesis, no melena,  hematochezia or hemoptysis.   PHYSICAL EXAMINATION:  GENERAL:  He is currently symptom free.  SKIN:  His skin is warm and dry.  He is in no acute distress.  VITAL SIGNS:  Blood pressure is 120/80.  His heart rate has come down to 72  and normal sinus rhythm.  His respiratory rate is 12.  He is afebrile.  His  saturations are 99% on O2.  HEENT:  Sclerae clear.  He is normocephalic, atraumatic.  Extraocular  movements are intact.  PERRLA.  NECK:  Supple.  There are good carotid upstrokes.  There are no bruits  noted.  Thyroid is not enlarged.  Trachea is midline.  Lungs reveals  crackles in both bases.  CARDIOVASCULAR:  Exam is essentially normal with normal S1 and S2 without  gallop.  There is no murmur.  ABDOMEN:  Exam is soft.  Good bowel sounds.  No midline bruit.  There is no  hepatomegaly.  There is no tenderness.  EXTREMITIES:  No cyanosis, clubbing or edema.  Pulses were brisk.  NEUROLOGICAL:  Exam is intact.   LABORATORY DATA:  Chest x-ray shows no acute cardiopulmonary disease.   Laboratory data shows a creatinine of 1.6, BUN of 24, hemoglobin of 17.  Platelets are  169,000.  Point care of marker is negative x2.   ASSESSMENT:  Non-ST segment elevation myocardial infarction versus an acute  coronary syndrome with unstable angina, point of care markers being negative  x2 is encouraging.  However, I am concerned that he has had an infarct.  He  is now back to baseline and is symptom free.   PLAN:  1.  Continue current medications with beta blockade to a resting heart rate      of 60, aspirin, IV nitroglycerin, IV heparin, Integrilin.  2.  Check serial enzymes.  3.  TSH.  4.  Hydrate.  We are checking BUN and creatinine tomorrow.  5.  Cardiac catheterization urgently if symptoms recur and/or Monday if      symptoms continue to be stabilized.   I discussed this with the wife and the patient and they understand and agree  to proceed.      Thomas C. Wall, M.D.  Electronically Signed     TCW/MEDQ  D:  11/19/2005  T:  11/19/2005  Job:  [id number]   cc:   Deboraha Sprang, M.D.  [address] [place]  [place] [address]

## 2011-05-13 NOTE — Assessment & Plan Note (Signed)
[facility] OFFICE NOTE   DEADRICK, VERDUGO                        MRN:          [id number]  DATE:11/28/2006                            DOB:          [birth year]    Mr. Whisler returns today for management of the following issues:  1. Coronary artery disease. Non ST segment elevation infarct, status      post stenting of the mid LAD November 21, 2005, with a Cipher      stent. Has residual 60% ostial and mid diagonal disease. He has      normal left ventricular function. Stress Myoview June 06, 2006: EF      52%, with a defect in the inferior wall with no ischemia. He is      having no angina.  2. History of hypertension, currently well-controlled by Dr. Caryl Comes.  3. Hyperlipidemia. He continues to have a lot of myalgias and      arthralgias. It was not better off of Vytorin. He is only taking      Vytorin unfortunately p.r.n. We reviewed this extensively today and      have encouraged him to take it daily. We can always try pravastatin      or Crestor in the future if he continues to have problems.  4. Remote tobacco.  5. Mild renal insufficiency.  6. Asymptomatic sinus brady.   MEDICATIONS:  Are aspirin 325 a day, Plavix 75 mg a day, Norvasc 5 mg a  day, Toprol XL 25 mg a day and Vytorin 10/20 on occasion.   PHYSICAL EXAMINATION:  His blood pressure is 128/70. His pulse is 64 and  regular. His weight is 204.  HEENT: He has a ruddy complexion. Skin is warm and dry.  PERRLA. Extra-  ocular movements intact. Sclerae are clear. Facial symmetry is normal.  Dentition is satisfactory. Carotid upstrokes are equal bilaterally  without bruits. There is no JVD.  Thyroid is not enlarged. Trachea is  midline.  LUNGS:  Are clear.  HEART: Reveals soft S1, S2.  ABDOMEN: Is protuberant with good bowel sounds.  EXTREMITIES: Reveals no cyanosis, clubbing or edema. Pulses are intact.   Electrocardiogram demonstrates sinus  bradycardia with left axis  deviation and a right bundle branch pattern. The right bundle is new.   ASSESSMENT/PLAN:  I have had a long talk with Mr. Dasari today. I spent  most of our time talking about the need to take a statin. I do not think  his Vytorin is causing his problems and have asked him to take it on a  daily basis. If he cannot take it or decides to not take it again, he  promises to let me know. We can always try pravastatin or Crestor.   He will continue his other medications including Plavix. I will plan on  seeing back again June/2008.     Thomas C. Verl Blalock, MD, [facility]  Electronically Signed    TCW/MedQ  DD: 11/28/2006  DT: 11/28/2006  Job #: [id number]   cc:   Ramonita Lab, MD

## 2011-05-13 NOTE — Assessment & Plan Note (Signed)
[facility] OFFICE NOTE   ZAEDYN, KOCOUREK                        MRN:          [id number]  DATE:04/23/2007                            DOB:          [birth year]    Mr. Ma returns today after being discharged from [facility] for hip replacement by Dr. Jefm Bryant.  He had severe  degenerative arthritis to the left hip.  He underwent surgery on April 03, 2007.   He had some tingling in his arms apparently after surgery.  Cardiac  enzymes were ordered and showed a peak troponin of 1.1, but at the same  time his CPK increased to 737 with an MB of 10.7.   His EKG and other parameters stayed stable.   A 2D echocardiogram on April 10 showed normal left ventricular function  with no wall motion abnormalities.  He did have moderate concentric LVH.  The rest of his study was normal.  He has done well since surgery.  He  has in-home rehab.  He has lost about 9 pounds.   He is on:  1. Plavix 75 mg a day.  2. Norvasc 5 mg a day.  3. Toprol XL 25 mg a day.  4. Vytorin 10/20 daily.  5. Vitamin C 500 mg a day.  6. Aspirin 81 mg a day.   EXAMINATION:  His blood pressure 126/72, his pulse is 66 and regular.  He is in no acute distress.  SKIN:  Warm and dry.  HEENT:  Normocephalic/atraumatic, PERRLA, extraocular movements intact,  sclerae are clear, facial symmetry is normal.  Carotids are equal  bilaterally without bruit, there is no JVD, thyroid is not enlarged,  trachea is midline.  LUNGS:  Clear.  HEART:  Reveals a nondisplaced PMI with normal S1, S2; no gallop.  ABDOMINAL:  Slightly protuberant, good bowel sounds.  EXTREMITIES:  Reveal no edema, pulses are present.  NEURO:  Exam is intact.   Electrocardiogram shows sinus rhythm with an occasional PVC.  He has  nonspecific intraventricular conduction delay with left axis deviation.  This is unchanged from his previous ECG.   I think Mr.  [name] is doing well.  I do not think he had a cardiac  event in the hospital.  I will see him back in 6 months.  He will be due  lipids and LFTs in June.     Thomas C. Verl Blalock, MD, [facility]  Electronically Signed    TCW/MedQ  DD: 04/23/2007  DT: 04/23/2007  Job #: [id number]   cc:   Leanor Kail, MD  Ramonita Lab, MD

## 2011-05-13 NOTE — Discharge Summary (Signed)
NAMESOFIAN, [name] NO.:  [id number]   MEDICAL RECORD NO.:  [id number]          PATIENT TYPE:  INP   LOCATION:  2907                         FACILITY:  [facility]   PHYSICIAN:  Marijo Conception. Wall, M.D.   DATE OF BIRTH:  06-26-1931   DATE OF ADMISSION:  11/19/2005  DATE OF DISCHARGE:  11/24/2005                                 DISCHARGE SUMMARY   PRIMARY DIAGNOSIS:  Non-ST segment elevation myocardial infarction.   SECONDARY DIAGNOSES:  1.  Hyperlipidemia.  2.  Hypertension.  3.  Renal insufficiency with BUN of 24 and creatinine of 1.6 at admission,      18 and 1.5 at discharge.  4.  Pulmonary venous hypertension on chest x-ray.  5.  Family history of coronary artery disease.  6.  History of benign prostatic hypertrophy status post Transurethral      resection of the prostate.  7.  Osteoarthritis.   ALLERGIES:  NO KNOWN DRUG ALLERGIES.   PRIMARY CARE PHYSICIAN:  Deboraha Sprang, M.D., [facility]   PRIMARY CARDIOLOGIST:  New and will be Dr. Verl Blalock.   PROCEDURES:  1.  Cardiac catheterization.  2.  Coronary arteriogram.  3.  Left ventriculogram.  4.  PTCA and CYPHER stent to the LAD.  5.  Relook catheterization showing a patent stent and otherwise stable      anatomy.   HOSPITAL COURSE:  [name] is a 77 year old male with no known history of  coronary artery disease.  He had onset of left chest pain that radiated up  into his neck and down his left arm while he was playing golf.  He took baby  aspirin x4 with no change.  His sister brought him to the hospital and at  that time he was complaining of 7 to 8/10 chest pain as well as slight  diaphoresis, dizziness and shortness of breath.  He was started on IV  nitroglycerin which helped his pain and he was admitted for further  evaluation and treatment.   He was continued on IV nitroglycerin and started on heparin and Integrilin.  His cardiac enzymes elevated indicating a non-ST segment elevation MI.  Cardiac catheterization was performed to further define his anatomy.   The cardiac catheterization showed a 95% mid LAD treated with PTCA and a  CYPHER stent reducing the stenosis to less than 10%.  He had a small  diagonal vessel with a 70 to 80% stenosis and approximately 40% disease in  the proximal LAD, OM and RCA.  His EF was 55%.   Post procedure, he still had some left arm aching and numbness that was some  better with medication.  His EKG had no new changes and his cardiac enzymes  showed only minimal elevation post procedure.   He was seen by cardiac rehab but continued to have arm pain and it was felt  that he needed a relook catheterization to make sure that his stent was  patent.   A relook catheterization was performed on November 23, 2005, and showed a  patent stent with otherwise stable anatomy.  There was TIMI-2  flow in an OM  vessel that had been noted previously but this was a small vessel and not  amenable to percutaneous intervention.  Medical therapy was planned.   On November 24, 2005, he was evaluated by Dr. Verl Blalock.  He had a slight rash on  his back that was felt secondary to plastic and heat.  He was continued on  Norvasc which he had been taking prior to admission.  He is also to continue  taking aspirin, Toprol and Plavix.  The Diclofenac is on hold.  He was  started on Vytorin for dyslipidemia with a total cholesterol of 165,  triglycerides 232, HDL 23, LDL 96.  He will need follow-up lipid and liver  profile in six to 12 weeks.  Mr. Geyman was evaluated by Dr. Verl Blalock and  considered stable for discharge on November 24, 2005, with outpatient follow-  up arranged.   DISCHARGE INSTRUCTIONS:  1.  He is to keep the cath site clean and dry.  2.  He is to stick to a low fat diet.  3.  He is to follow up with Dr. Verl Blalock on December 05, 2005, at 11 a.m. and      with Dr. Caryl Comes at the [facility] as needed.   DISCHARGE MEDICATIONS:  1.  Aspirin 325 mg a day.   2.  Plavix 75 mg daily.  3.  Norvasc 5 mg daily.  4.  Toprol XL 25 mg daily.  5.  Sublingual nitroglycerin p.r.n.  6.  Vytorin 10/40 q.p.m.  7.  Benadryl 25 mg q.6h. p.r.n.  8.  He is not to take Diclofenac for now.      Rosaria Ferries, P.A. [place]. Wall, M.D.  Electronically Signed    RB/MEDQ  D:  11/24/2005  T:  11/25/2005  Job:  [id number]   cc:   Deboraha Sprang, M.D.  [address] [place]  [place] [address]

## 2011-06-03 ENCOUNTER — Encounter: Payer: Self-pay | Admitting: Cardiology

## 2011-09-30 LAB — CBC
HCT: 24.9 — ABNORMAL LOW
HCT: 27.7 — ABNORMAL LOW
HCT: 27.7 — ABNORMAL LOW
HCT: 28.6 — ABNORMAL LOW
Hemoglobin: 10 — ABNORMAL LOW
Hemoglobin: 8.5 — ABNORMAL LOW
Hemoglobin: 9.6 — ABNORMAL LOW
Hemoglobin: 9.6 — ABNORMAL LOW
MCHC: 34.3
MCHC: 34.7
MCHC: 34.9
MCV: 95.4
MCV: 95.6
MCV: 98.3
Platelets: 277
Platelets: 292
Platelets: 299
Platelets: 302
Platelets: 315
RBC: 2.53 — ABNORMAL LOW
RBC: 2.91 — ABNORMAL LOW
RBC: 3 — ABNORMAL LOW
RDW: 14.4
RDW: 14.9
RDW: 15.1
RDW: 15.2
RDW: 15.6 — ABNORMAL HIGH
WBC: 10.3
WBC: 8.4
WBC: 9.2

## 2011-09-30 LAB — COMPREHENSIVE METABOLIC PANEL
ALT: 57 — ABNORMAL HIGH
ALT: 62 — ABNORMAL HIGH
AST: 201 — ABNORMAL HIGH
AST: 266 — ABNORMAL HIGH
Albumin: 2.4 — ABNORMAL LOW
Albumin: 2.5 — ABNORMAL LOW
Alkaline Phosphatase: 41
Alkaline Phosphatase: 42
BUN: 15
BUN: 16
CO2: 26
CO2: 28
Calcium: 8.1 — ABNORMAL LOW
Calcium: 8.2 — ABNORMAL LOW
Chloride: 101
Chloride: 102
Creatinine, Ser: 1.24
Creatinine, Ser: 1.29
GFR calc Af Amer: >60
GFR calc Af Amer: >60
GFR calc non Af Amer: 54 — ABNORMAL LOW
GFR calc non Af Amer: 57 — ABNORMAL LOW
Glucose, Bld: 134 — ABNORMAL HIGH
Glucose, Bld: 135 — ABNORMAL HIGH
Potassium: 4.4
Potassium: 4.5
Sodium: 134 — ABNORMAL LOW
Sodium: 136
Total Bilirubin: 1
Total Bilirubin: 1
Total Protein: 5.3 — ABNORMAL LOW
Total Protein: 5.4 — ABNORMAL LOW

## 2011-09-30 LAB — BASIC METABOLIC PANEL
BUN: 16
BUN: 18
CO2: 28
Calcium: 8.2 — ABNORMAL LOW
Calcium: 8.5
Chloride: 98
Creatinine, Ser: 1.35
GFR calc Af Amer: >60
GFR calc non Af Amer: 52 — ABNORMAL LOW
GFR calc non Af Amer: 54 — ABNORMAL LOW
Glucose, Bld: 104 — ABNORMAL HIGH
Glucose, Bld: 110 — ABNORMAL HIGH
Potassium: 4.1
Potassium: 4.2
Sodium: 133 — ABNORMAL LOW

## 2011-09-30 LAB — IRON: Iron: 39 — ABNORMAL LOW

## 2011-09-30 LAB — CROSSMATCH
ABO/RH(D): A POS
Antibody Screen: NEGATIVE

## 2011-09-30 LAB — I-STAT EC8
BUN: 23
Bicarbonate: 22.3
Chloride: 102
Glucose, Bld: 192 — ABNORMAL HIGH
HCT: 25 — ABNORMAL LOW
Hemoglobin: 8.5 — ABNORMAL LOW
Operator id: 284701
Potassium: 4.1
Sodium: 133 — ABNORMAL LOW
TCO2: 23
pCO2 arterial: 25.6 — ABNORMAL LOW
pH, Arterial: 7.548 — ABNORMAL HIGH

## 2011-09-30 LAB — CARDIAC PANEL(CRET KIN+CKTOT+MB+TROPI)
CK, MB: 139.1 — ABNORMAL HIGH
CK, MB: 276.8 — ABNORMAL HIGH
CK, MB: 71.9 — ABNORMAL HIGH
Relative Index: 11 — ABNORMAL HIGH
Relative Index: 11.6 — ABNORMAL HIGH
Relative Index: 9.3 — ABNORMAL HIGH
Total CK: 1266 — ABNORMAL HIGH
Total CK: 2378 — ABNORMAL HIGH
Total CK: 769 — ABNORMAL HIGH
Troponin I: 75.53
Troponin I: >100
Troponin I: >100

## 2011-09-30 LAB — APTT: aPTT: 32

## 2011-09-30 LAB — LIPID PANEL
Cholesterol: 100
HDL: 22 — ABNORMAL LOW
LDL Cholesterol: 59
Total CHOL/HDL Ratio: 4.5
Triglycerides: 97
VLDL: 19

## 2011-09-30 LAB — ABO/RH: ABO/RH(D): A POS

## 2011-09-30 LAB — HEMOGLOBIN A1C
Hgb A1c MFr Bld: 5.2
Mean Plasma Glucose: 108

## 2011-09-30 LAB — PROTIME-INR
INR: 1.2
Prothrombin Time: 15

## 2011-09-30 LAB — TSH: TSH: 1.757

## 2011-09-30 LAB — POCT I-STAT CREATININE
Creatinine, Ser: 1.2
Operator id: 284701

## 2011-09-30 LAB — FERRITIN: Ferritin: 120 (ref 22–322)

## 2011-10-20 ENCOUNTER — Emergency Department (HOSPITAL_COMMUNITY): Payer: Medicare Other

## 2011-10-20 ENCOUNTER — Inpatient Hospital Stay (HOSPITAL_COMMUNITY)
Admission: EM | Admit: 2011-10-20 | Discharge: 2011-10-22 | DRG: 247 | Disposition: A | Discharge status: Home / Self Care | Payer: Medicare Other | Attending: Internal Medicine | Admitting: Internal Medicine

## 2011-10-20 DIAGNOSIS — Z23 Encounter for immunization: Secondary | ICD-10-CM

## 2011-10-20 DIAGNOSIS — Z87891 Personal history of nicotine dependence: Secondary | ICD-10-CM

## 2011-10-20 DIAGNOSIS — N183 Chronic kidney disease, stage 3 unspecified: Secondary | ICD-10-CM | POA: Diagnosis present

## 2011-10-20 DIAGNOSIS — I200 Unstable angina: Secondary | ICD-10-CM

## 2011-10-20 DIAGNOSIS — E669 Obesity, unspecified: Secondary | ICD-10-CM | POA: Diagnosis present

## 2011-10-20 DIAGNOSIS — Z7982 Long term (current) use of aspirin: Secondary | ICD-10-CM

## 2011-10-20 DIAGNOSIS — Z9861 Coronary angioplasty status: Secondary | ICD-10-CM

## 2011-10-20 DIAGNOSIS — I129 Hypertensive chronic kidney disease with stage 1 through stage 4 chronic kidney disease, or unspecified chronic kidney disease: Secondary | ICD-10-CM | POA: Diagnosis present

## 2011-10-20 DIAGNOSIS — Z7902 Long term (current) use of antithrombotics/antiplatelets: Secondary | ICD-10-CM

## 2011-10-20 DIAGNOSIS — E785 Hyperlipidemia, unspecified: Secondary | ICD-10-CM | POA: Diagnosis present

## 2011-10-20 DIAGNOSIS — I2510 Atherosclerotic heart disease of native coronary artery without angina pectoris: Principal | ICD-10-CM | POA: Diagnosis present

## 2011-10-20 DIAGNOSIS — I2589 Other forms of chronic ischemic heart disease: Secondary | ICD-10-CM | POA: Diagnosis present

## 2011-10-20 DIAGNOSIS — D696 Thrombocytopenia, unspecified: Secondary | ICD-10-CM | POA: Diagnosis present

## 2011-10-20 DIAGNOSIS — Z96649 Presence of unspecified artificial hip joint: Secondary | ICD-10-CM

## 2011-10-20 DIAGNOSIS — Z79899 Other long term (current) drug therapy: Secondary | ICD-10-CM

## 2011-10-20 LAB — CBC
HCT: 46.5 % (ref 39.0–52.0)
Hemoglobin: 16.1 g/dL (ref 13.0–17.0)
RBC: 4.78 MIL/uL (ref 4.22–5.81)
WBC: 6 10*3/uL (ref 4.0–10.5)

## 2011-10-20 LAB — TROPONIN I: Troponin I: <0.3 ng/mL (ref <0.30)

## 2011-10-20 LAB — PROTIME-INR: Prothrombin Time: 16.1 seconds — ABNORMAL HIGH (ref 11.6–15.2)

## 2011-10-20 LAB — BASIC METABOLIC PANEL
BUN: 28 mg/dL — ABNORMAL HIGH (ref 6–23)
Calcium: 9.5 mg/dL (ref 8.4–10.5)
Creatinine, Ser: 1.59 mg/dL — ABNORMAL HIGH (ref 0.50–1.35)
GFR calc Af Amer: 46 mL/min — ABNORMAL LOW (ref >90)
GFR calc non Af Amer: 40 mL/min — ABNORMAL LOW (ref >90)
Glucose, Bld: 105 mg/dL — ABNORMAL HIGH (ref 70–99)

## 2011-10-20 LAB — DIFFERENTIAL
Basophils Absolute: 0.1 10*3/uL (ref 0.0–0.1)
Lymphocytes Relative: 35 % (ref 12–46)
Monocytes Absolute: 0.5 10*3/uL (ref 0.1–1.0)
Neutro Abs: 3.1 10*3/uL (ref 1.7–7.7)

## 2011-10-20 LAB — PRO B NATRIURETIC PEPTIDE: Pro B Natriuretic peptide (BNP): 315.2 pg/mL (ref 0–450)

## 2011-10-20 LAB — CK TOTAL AND CKMB (NOT AT ARMC)
CK, MB: 3.1 ng/mL (ref 0.3–4.0)
Relative Index: INVALID (ref 0.0–2.5)
Total CK: 78 U/L (ref 7–232)

## 2011-10-20 LAB — HEMOGLOBIN A1C: Mean Plasma Glucose: 120 mg/dL — ABNORMAL HIGH (ref <117)

## 2011-10-21 DIAGNOSIS — I2510 Atherosclerotic heart disease of native coronary artery without angina pectoris: Secondary | ICD-10-CM

## 2011-10-21 DIAGNOSIS — R072 Precordial pain: Secondary | ICD-10-CM

## 2011-10-21 HISTORY — PX: CARDIAC CATHETERIZATION: SHX172

## 2011-10-21 HISTORY — DX: Atherosclerotic heart disease of native coronary artery without angina pectoris: I25.10

## 2011-10-21 LAB — POCT I-STAT 3, ART BLOOD GAS (G3+)
Acid-base deficit: 5 mmol/L — ABNORMAL HIGH (ref 0.0–2.0)
Bicarbonate: 19.9 mEq/L — ABNORMAL LOW (ref 20.0–24.0)
pH, Arterial: 7.34 — ABNORMAL LOW (ref 7.350–7.450)
pO2, Arterial: 108 mmHg — ABNORMAL HIGH (ref 80.0–100.0)

## 2011-10-21 LAB — CBC
HCT: 43.2 % (ref 39.0–52.0)
Hemoglobin: 14.8 g/dL (ref 13.0–17.0)
MCH: 33.2 pg (ref 26.0–34.0)
MCV: 96.9 fL (ref 78.0–100.0)
RBC: 4.46 MIL/uL (ref 4.22–5.81)
WBC: 6.6 10*3/uL (ref 4.0–10.5)

## 2011-10-21 LAB — COMPREHENSIVE METABOLIC PANEL
ALT: 17 U/L (ref 0–53)
AST: 17 U/L (ref 0–37)
Albumin: 3.6 g/dL (ref 3.5–5.2)
CO2: 22 mEq/L (ref 19–32)
Chloride: 108 mEq/L (ref 96–112)
GFR calc non Af Amer: 45 mL/min — ABNORMAL LOW (ref >90)
Potassium: 4.6 mEq/L (ref 3.5–5.1)
Sodium: 140 mEq/L (ref 135–145)
Total Bilirubin: 0.5 mg/dL (ref 0.3–1.2)

## 2011-10-21 LAB — LIPID PANEL
LDL Cholesterol: 95 mg/dL (ref 0–99)
Triglycerides: 172 mg/dL — ABNORMAL HIGH (ref <150)
VLDL: 34 mg/dL (ref 0–40)

## 2011-10-21 LAB — POCT ACTIVATED CLOTTING TIME
Activated Clotting Time: 171 seconds
Activated Clotting Time: 523 seconds

## 2011-10-21 LAB — POCT I-STAT 3, VENOUS BLOOD GAS (G3P V)
Acid-base deficit: 6 mmol/L — ABNORMAL HIGH (ref 0.0–2.0)
Bicarbonate: 20.4 mEq/L (ref 20.0–24.0)
pCO2, Ven: 43 mmHg — ABNORMAL LOW (ref 45.0–50.0)
pO2, Ven: 40 mmHg (ref 30.0–45.0)

## 2011-10-21 LAB — CARDIAC PANEL(CRET KIN+CKTOT+MB+TROPI)
CK, MB: 3.1 ng/mL (ref 0.3–4.0)
Relative Index: INVALID (ref 0.0–2.5)
Relative Index: INVALID (ref 0.0–2.5)
Troponin I: <0.3 ng/mL (ref <0.30)
Troponin I: <0.3 ng/mL (ref <0.30)

## 2011-10-21 LAB — DIFFERENTIAL
Eosinophils Absolute: 0.4 10*3/uL (ref 0.0–0.7)
Lymphocytes Relative: 33 % (ref 12–46)
Lymphs Abs: 2.2 10*3/uL (ref 0.7–4.0)
Monocytes Relative: 11 % (ref 3–12)
Neutro Abs: 3.3 10*3/uL (ref 1.7–7.7)
Neutrophils Relative %: 51 % (ref 43–77)

## 2011-10-22 DIAGNOSIS — R079 Chest pain, unspecified: Secondary | ICD-10-CM

## 2011-10-22 LAB — BASIC METABOLIC PANEL
BUN: 22 mg/dL (ref 6–23)
CO2: 23 mEq/L (ref 19–32)
Chloride: 107 mEq/L (ref 96–112)
Glucose, Bld: 110 mg/dL — ABNORMAL HIGH (ref 70–99)
Potassium: 4.4 mEq/L (ref 3.5–5.1)
Sodium: 138 mEq/L (ref 135–145)

## 2011-10-22 LAB — CBC
HCT: 42.8 % (ref 39.0–52.0)
Hemoglobin: 14.3 g/dL (ref 13.0–17.0)
RBC: 4.41 MIL/uL (ref 4.22–5.81)

## 2011-10-24 NOTE — Cardiovascular Report (Signed)
[name] [name], [name] [name] NO.:  [id number]  MEDICAL RECORD NO.:  [id number]  LOCATION:  [facility]                         FACILITY:  [facility]  PHYSICIAN:  Lauree Chandler, MDDATE OF BIRTH:  03/04/1931  DATE OF PROCEDURE:  10/21/2011 DATE OF DISCHARGE:                           CARDIAC CATHETERIZATION   PRIMARY CARDIOLOGIST:  Marcello Moores C. Wall, MD, [facility]  PROCEDURES PERFORMED: 1. Right heart catheterization. 2. Left heart catheterization. 3. Selective coronary angiography. 4. Percutaneous transluminal coronary angioplasty with placement of a     drug-eluting stent in the second obtuse marginal branch of the     circumflex artery. 5. Placement of an Angio-Seal femoral artery closure device.  OPERATOR:  Lauree Chandler, MD  INDICATION:  This is a 77 year old Caucasian male with a history of coronary artery disease, status post prior stenting of the mid-LAD, ischemic cardiomyopathy, hypertension, hyperlipidemia, and chronic kidney disease who was admitted to the hospital with complaints of shortness of breath with minimal exertion and weakness.  Diagnostic catheterizations was planned for today.  The patient's cardiac enzymes were negative overnight.  DETAILS OF PROCEDURE:  The patient was brought to the Main Cardiac Catheterization Laboratory after signing informed consent for the procedure.  The right groin was prepped and draped in a sterile fashion. Lidocaine 1% was used for local anesthesia.  A 5-French sheath was inserted into the right femoral artery without difficulty.  A 7-French sheath was inserted into the right femoral vein without difficulty.  A he balloon-tipped catheter was used to perform a right heart catheterization.  Standard diagnostic catheters were initially used to perform the diagnostic catheterization.  A JL4 catheter was used to perform selective angiography of the left coronary system.  I was unable to engage the right coronary artery  with a JR4 guiding catheter. Ultimately, we used the [facility] right catheter to selectively engage this vessel.  A pigtail catheter was used to perform a left ventricular angiogram.  At this point, we elected to proceed intervention of the severe stenosis in the obtuse marginal branch of the circumflex artery. The sheath was upsized to a 6-French sheath.  The patient was given a bolus of Angiomax and the drip was started.  The patient had been taking Plavix on a daily basis.  When the ACT was greater than 200, we selectively engaged the left main artery with an XB 3.0 guiding catheter.  A Cougar intracoronary wire was passed down the circumflex artery into the obtuse marginal branch.  A 2.5 x 10-mm balloon was used to predilate the lesion.  A 2.5 x 16-mm Promus Element drug-eluting stent was carefully positioned in the proximal portion of the second obtuse marginal branch of the circumflex artery and deployed without difficulty.  A 2.5 x 12-mm noncompliant balloon was inflated once inside the stented segment.  There was excellent angiographic result.  The stenosis was taken from 95% to 0%.  There was excellent flow down the vessel.  There were no immediate complications.  The sheath was removed from the artery in the Cath Lab and an Angio-Seal was placed.  There was good hemostasis achieved.  The patient has taken to the recovery area.  HEMODYNAMIC FINDINGS:  Central aortic  pressure 141/60.  Left ventricular pressure 141/15/26.  Right atrial pressure 10.  Right ventricular pressure 40/8/16.  Pulmonary artery pressure 32/16 with a mean of 24. Pulmonary capillary wedge pressure 22.  Cardiac output 4.5 liters/minute.  Cardiac index 2.2 liters/minute per meter squared. Central aortic saturation 98%.  Pulmonary artery saturation 68%.  ANGIOGRAPHIC FINDINGS: 1. The left main coronary artery had no evidence of disease. 2. The left anterior descending was a large vessel that coursed to the      apex.  The proximal vessel had a 30% stenosis.  There were 2     overlapping stents in the mid-vessel that were both patent with no     restenosis.  The distal vessel had mild plaque disease.  There was     a moderate-sized diagonal branch that was jailed by the stents.     This appears unchanged from the catheterization 4 years ago.  There     was 95% ostial stenosis in this diagonal branch.  There was good     flow down the branch. 3. The circumflex artery gives off a very small-caliber first obtuse     marginal branch and a moderate-sized second obtuse marginal branch.     There was a 95% stenosis in the proximal portion of the first     obtuse marginal branch. 4. The right coronary is a large dominant vessel with mild plaque in     the proximal mid and distal segments. Left ventricular angiogram shows moderate left ventricular systolic dysfunction with hypokinesis of the anterolateral wall, apex and inferoapical wall.  Ejection fraction was 30-35%.  IMPRESSION: 1. Double-vessel coronary artery disease. 2. Moderate left ventricular systolic dysfunction. 3. Percutaneous transluminal coronary angioplasty with placement of a     drug-eluting stent in the second obtuse marginal branch of the     circumflex artery.  RECOMMENDATIONS:  We will continue aspirin, Plavix, a beta-blocker, and a statin.  We will restart the patient's ACE inhibitor tomorrow.  He may need some diuresis following his cath.     Lauree Chandler, MD     CM/MEDQ  D:  10/21/2011  T:  10/21/2011  Job:  [id number]  cc:   Thomas C. Verl Blalock, MD, [facility]  Electronically Signed by Lauree Chandler MD on 10/24/2011 08:30:49 AM

## 2011-10-30 NOTE — H&P (Signed)
NAMEMUSE, BRINCEFIELD NO.:  [id number]  MEDICAL RECORD NO.:  [id number]  LOCATION:  2038                         FACILITY:  [facility]  PHYSICIAN:  Champ Mungo. Lovena Le, MD    DATE OF BIRTH:  15-Nov-1931  DATE OF ADMISSION:  10/20/2011 DATE OF DISCHARGE:                             HISTORY & PHYSICAL   PRIMARY CARDIOLOGIST:  Marcello Moores C. Verl Blalock, MD, [facility]  PRIMARY CARE PROVIDER:  Ramonita Lab, MD.  PATIENT PROFILE:  A 77 year old male with prior history of CAD status post LAD stenting who presents with a several week history of progressive dyspnea and chest discomfort.  PROBLEMS: 1. Unstable angina/CAD.     a.     Status post non ST elevation MI in November 2006,      catheterization revealing significant LAD stenosis that was      treated with a 2.75 x 23 mm Cypher drug-eluting stent.     b.     December 12, 2007, anterior ST elevation MI in the setting      of having been off Plavix and aspirin for hip surgery.      Catheterization at that time showed left main 30%, LAD 100% mid      with in-stent restenosis.  Left circumflex normal.  Right coronary      artery was calcified ostially with 40% distal stenosis.  EF was      40%.  The LAD was initially balloon angioplasty and subsequently      stented with a 4.0 x 12 mm Liberte bare metal stent proximal to      prior stent. 2. Stage III chronic kidney disease. 3. Ischemic cardiomyopathy.     a.     A 2D echocardiogram April 16, 2009, EF 35-40% with      anteroseptal anterior apical akinesis. 4. Hypertension. 5. Hyperlipidemia. 6. Obesity. 7. BPH status post TURP. 8. Status post right hip replacement in 2008. 9. Remote tobacco abuse. 10.History of asymptomatic sinus bradycardia.  ALLERGIES:  No known drug allergies.  HISTORY OF PRESENT ILLNESS:  A 77 year old male with the above problem list who was in his usual state of health until several weeks ago when he began to experience progressive exertional dyspnea.  Over  the past week or more, the exertional dyspnea has been associated with a numb type discomfort and sensation in his upper chest and bilateral arms. Symptoms generally resolved with rest and few minutes.  Last night, the patient was watching TV and had sudden onset of dyspnea with numb sensation in his chest and arms.  He is quite uncomfortable and took some aspirin and nitroglycerin with eventual relief in about 30 minutes. He notes that his prior angina was a more severe type of chest discomfort.  He slept well last night and called [place] office this morning and when he could not be seen, he decided to come into the ED for evaluation.  Currently he is pain free and his ECG is nonacute.  His first troponin is negative.  HOME MEDICATIONS: 1. Plavix 75 mg daily. 2. Norvasc 5 mg daily. 3. Aspirin 325 mg daily. 4. Lisinopril 10 mg daily. 5. Carvedilol 6.25 mg b.i.d.  FAMILY HISTORY:  Mother died at about 19 with Parkinson's.  Father died at 14 with emphysema and coronary disease.  He has a brother and 3sisters whose health is unknown.  SOCIAL HISTORY:  The patient lives in [place] with his wife.  He is retired from [employer].  He previously smoked and quit in 1969.  Denies alcohol or drug use.  He plays golf regularly but does not walk the course and instead uses a cart.  Does not pay attention to any sort of diet.  REVIEW OF SYSTEMS:  Notable for exertional dyspnea and chest discomfort. He has had mild lower extremity edema, occasional exertional palpitations, and presyncope.  He has history of BPH status post TURP. No nocturia or urinary hesitancy.  He is a full code.  Otherwise all systems reviewed and negative.  PHYSICAL EXAMINATION:  VITAL SIGNS:  Temperature 97.9, heart rate 61, respirations 24, blood pressure 126/70, pulse ox 97% on room air. GENERAL:  Pleasant white male in no acute distress.  Awake and alert x3. He has normal affect.  HEENT:  Normal.  Nares grossly  intact.  Nonfocal. SKIN:  Warm and dry with multiple plaques upon his back and upper chest. NECK:  Supple without bruits or JVD. LUNGS:  Respirations are unlabored and clear to auscultation. CARDIAC:  Regular S1, S2.  No S3, S4, or murmurs. ABDOMEN:  Round, soft, nontender, nondistended.  Bowel sounds present x4. EXTREMITIES:  Warm, dry, and pink.  No clubbing, cyanosis, or edema. Distal pulses are 1+ and equal bilaterally.  He has normal Allen's test. No femoral bruits noted.  Chest x-ray shows cardiac size upper limit of normal.  No acute cardiopulmonary abnormality.  Chronic T12-L1 fracture.  EKG shows sinus bradycardia rate of 57.  He has got IVCD with left axis deviation, poor R-wave progression suggestive of prior inferior MI.  No acute ST-T changes. Hemoglobin 16.1 hematocrit 46.5, WBC 6.0, platelets 134, sodium 140, potassium 4.8, chloride 108, CO2 23, BUN 20, creatinine 1.59, glucose 105, troponin 0.01, calcium 9.5.  ASSESSMENT/PLAN: 1. Unstable angina/coronary artery disease.  The patient presents with     2-3 week history of progressive dyspnea, now associated with chest     discomfort and also resting symptoms relieved with nitrates and     aspirin.  Plan to admit and cycle enzymes.  We will add IV heparin     and gently hydrate in preparation for catheterization tomorrow.     The patient's renal function is mildly elevated.  We will hold his     ACE inhibitor tonight and re-evaluate in the morning. 2. Hypertension.  Pressure is currently elevated in the ED.  Resume     his home medications and watch and adjust. 3. Ischemic cardiomyopathy.  The patient's last documented ejection     fraction was 35-40%.  This was in 2010 and therefore we will repeat     2D echocardiogram to re-evaluate left ventricular function as he is     not a great candidate for left ventriculography given his renal     insufficiency.  We will continue his beta-blocker.  For the time     being, we  are going to hold his ACE inhibitor just to get him     through cath.  We will plan to resume post     catheterization. 4. Hyperlipidemia.  It is not clear if the patient is on a statin at     home.  At one point, he  was on simvastatin though he is not     currently on his list.  We will resume and check lipids and LFTs.     Murray Hodgkins, ANP   ______________________________ Champ Mungo. Lovena Le, MD    CB/MEDQ  D:  10/20/2011  T:  10/20/2011  Job:  [id number]  Electronically Signed by Murray Hodgkins ANP on 10/29/2011 01:58:47 PM Electronically Signed by Cristopher Peru MD on 10/29/2011 11:58:16 PM

## 2011-11-05 NOTE — Discharge Summary (Signed)
NAMEANDREJ, CROFTS NO.:  [id number]  MEDICAL RECORD NO.:  [id number]  LOCATION:  2508                         FACILITY:  [facility]  PHYSICIAN:  Carlena Bjornstad, MD, FACCDATE OF BIRTH:  11-26-1931  DATE OF ADMISSION:  10/20/2011 DATE OF DISCHARGE:  10/22/2011                              DISCHARGE SUMMARY   PRIMARY CARDIOLOGIST:  Dr. Ida Rogue (previously followed by Dr. Verl Blalock in [place]).  REASON FOR ADMISSION:  Dyspnea on exertion and chest pain.  DISCHARGE DIAGNOSES: 1. Unstable angina pectoris.     a.     Status post drug-eluting stent placement to the obtuse      marginal this admission. 2. Coronary artery disease.     a.     Status post non-ST-elevation myocardial infarction, November      2006 - treated with Cypher drug-eluting stent to the left anterior      descending.     b.     Anterior ST-elevation myocardial infarction, December 2008 -      secondary to stent thrombosis in the setting of coming off Plavix,      treated with balloon angioplasty and bare metal stenting. 3. Ischemic cardiomyopathy with an ejection fraction of 45% by     echocardiogram this admission. 4. Chronic kidney disease, stage III. 5. Hypertension. 6. Hyperlipidemia. 7. Benign prostatic hypertrophy. 8. History of asymptomatic sinus bradycardia. 9. Obesity. 10.History of right hip replacement, 2008. 11.Thrombocytopenia.  ALLERGIES:  No known drug allergies.  PROCEDURES PERFORMED THIS ADMISSION: Cardiac catheterizations, October 21, 2011 by Dr. Angelena Form:  Proximal LAD 30%, mid LAD stent patent, moderate-sized ostial diagonal  95% (jailed by previous stent in LAD) - unchanged for 4 years; obtuse marginal 95%; mild plaque in the RCA; LVEF 30-35%.  Percutaneous coronary intervention description by Dr. Lauree Chandler, October 21, 2011: Promus Element drug-eluting stent to the obtuse marginal.  ADMISSION HISTORY:  Mr. Knipper is a 77 year old male  patient, previously followed by Dr. Verl Blalock in [place] who presented to the emergency room with complaints of exertional dyspnea and a strange chest discomfort described as numbness.  His symptoms progressed to occurring at rest.  Initial cardiac markers and ECG were unremarkable.  He was admitted for further evaluation and treatment.  HOSPITAL COURSE:  The patient was ruled out for myocardial infarction by enzymes.  He was hydrated and his ACE inhibitor was held in preparation for cardiac catheterization given his chronic kidney disease.  He underwent cardiac catheterization, October 21, 2011.  As noted above, he had a jailed diagonal that was stable and a critical 95% lesion, obtuse marginal.  Obtuse marginal was treated with a Promus Element drug- eluting stent.  His EF was 30-35%.  Followup echocardiogram prior to discharge demonstrated apical hypokinesis and an EF in the 45% range, mild LVH, trivial AI, mild RAE.  On the morning of discharge, he was evaluated by Dr. Ron Parker.  He was felt stable enough for discharge to home.His creatinine was stable.  His ACE inhibitor was restarted at discharge.  LABORATORY DATA:  Hemoglobin 14.3, platelet count 126,000.  At discharge, sodium 138, potassium 4.4, BUN 22, creatinine 1.43, ALT is 17.  Hemoglobin A1c 5.8.  Cardiac markers negative x3.  Total cholesterol 156, triglycerides 172, HDL 27, LDL 95.  Chest x-ray upon admission, no acute abnormality.  DISCHARGE MEDICATIONS: 1. Nitroglycerin p.r.n. chest pain. 2. Crestor 10 mg daily. 3. Amlodipine 5 mg daily. 4. Aspirin 325 mg daily. 5. Carvedilol 6.25 mg twice daily. 6. Lisinopril 10 mg daily. 7. Plavix 75 mg daily.  ALLERGIES:  No known drug allergies.  ACTIVITIES:  Increase activity slowly.  He may shower.  No lifting or sexual activity for 1 week.  No driving for 2 days.  DIET:  Low-fat, low-sodium diet.  WOUND CARE:  He should call our office in [facility] for any  swelling, bleeding, bruising, or fever.  FOLLOWUP: 1. He will now follow up with Dr. Rockey Situ in [address].  He will need     a 2 week followup and the office will contact with an appointment. 2. The patient should undergo basic metabolic panel and CBC at the     time of follow up in 2 weeks with Dr. Rockey Situ, to follow up on     thrombocytopenia and his chronic kidney disease     postcatheterization.  TIME SPENT:  Total physician PA time greater than 30 minutes since discharge.     Richardson Dopp, PA-C   ______________________________ Carlena Bjornstad, MD, [facility]    SW/MEDQ  D:  10/22/2011  T:  10/22/2011  Job:  [id number]  Electronically Signed by Richardson Dopp PA-C on 10/29/2011 [id number] PM Electronically Signed by Dola Argyle MD [place] on 11/05/2011 06:32:46 AM

## 2011-11-07 ENCOUNTER — Encounter: Payer: Self-pay | Admitting: Cardiovascular Disease

## 2011-11-08 ENCOUNTER — Encounter: Payer: Self-pay | Admitting: Cardiovascular Disease

## 2011-11-08 ENCOUNTER — Ambulatory Visit (INDEPENDENT_AMBULATORY_CARE_PROVIDER_SITE_OTHER): Payer: Medicare Other | Admitting: Cardiovascular Disease

## 2011-11-08 VITALS — BP 144/62 | HR 57 | Ht 66.0 in | Wt 211.8 lb

## 2011-11-08 DIAGNOSIS — I10 Essential (primary) hypertension: Secondary | ICD-10-CM

## 2011-11-08 DIAGNOSIS — E785 Hyperlipidemia, unspecified: Secondary | ICD-10-CM

## 2011-11-08 DIAGNOSIS — I2510 Atherosclerotic heart disease of native coronary artery without angina pectoris: Secondary | ICD-10-CM

## 2011-11-08 NOTE — Assessment & Plan Note (Signed)
Recent stent placement to his OM 2. No subsequent chest pain. We'll continue aspirin and Plavix indefinitely. We'll decrease the aspirin dose to 81 mg x2 after 2-3 months.

## 2011-11-08 NOTE — Assessment & Plan Note (Signed)
We have stressed to him the importance of medication compliance in taking his statin daily. He has not started Crestor. He was not taking simvastatin daily. We'll keep him on simvastatin as his numbers previously looked excellent. We have suggested he take this every day and no matter what time of the day.

## 2011-11-08 NOTE — Assessment & Plan Note (Signed)
Blood pressure is well controlled on today's visit. No changes made to the medications. 

## 2011-11-08 NOTE — Progress Notes (Signed)
Patient ID: [name] [name], male    DOB: 17-Nov-1931, 77 y.o.   MRN: [id number]  HPI Comments: [name] [name] is a pleasant 77 year old gentleman with a history of coronary artery disease, prior stenting to his mid LAD, ischemic heart mildly, hypertension, hyperlipidemia, chronic kidney disease who was admitted to the hospital October 21, 2011 with chest pain, shortness of breath, weakness appeared cardiac catheterization was performed with PCI of the OM 2. Notes also indicate chronic kidney disease, stage III, asymptomatic bradycardia, obesity, right hip replacement 2008, thrombocytopenia, anterior ST elevation MI December 2008 secondary to stent thrombosis in the setting of stopping Plavix treated with bare-metal stenting. Prior DES stent to the LAD in 2006  Since the cardiac catheterization 09/2011, he has felt well. He has started golfing. his shortness of breath has improved. Overall he has no complaints.   Details of the cardiac catheterization showed left main shows no disease, LAD has 30% proximal disease, 2 overlapping stents in the mid region, 95% ostial diagonal disease jailed by the stents, 95% proximal OM 2 disease, mild plaque in the RCA, hypokinesis of the anterolateral wall, apical region and inferoapical wall with ejection fraction 30-35%.  EKG shows normal sinus rhythm with rate 57 beats per minute with interventricular conduction delay, T-wave abnormality in V4 through V6, one and aVL  Echocardiogram October 20, 2011 shows ejection fraction 45%, apical hypokinesis, mild LVH, mildly dilated left atrium   Outpatient Encounter Prescriptions as of 11/08/2011  Medication Sig Dispense Refill  . amLODipine (NORVASC) 5 MG tablet Take 5 mg by mouth daily.        Marland Kitchen aspirin 325 MG tablet Take 325 mg by mouth daily.        . carvedilol (COREG) 6.25 MG tablet Take 6.25 mg by mouth 2 (two) times daily with a meal.        . clopidogrel (PLAVIX) 75 MG tablet Take 75 mg by mouth daily.        Marland Kitchen  lisinopril (PRINIVIL,ZESTRIL) 10 MG tablet Take 10 mg by mouth daily.        . simvastatin (ZOCOR) 40 MG tablet Take 1 tablet (40 mg total) by mouth every evening.  90 tablet  4     Review of Systems  Constitutional: Negative.   HENT: Negative.   Eyes: Negative.   Respiratory: Negative.   Cardiovascular: Negative.   Gastrointestinal: Negative.   Musculoskeletal: Negative.   Skin: Negative.   Neurological: Negative.   Hematological: Negative.   Psychiatric/Behavioral: Negative.   All other systems reviewed and are negative.    BP 144/62  Pulse 57  Ht 5\' 6"  (1.676 m)  Wt 211 lb 12 oz (96.049 kg)  BMI 34.18 kg/m2  Physical Exam  Nursing note and vitals reviewed. Constitutional: He is oriented to person, place, and time. He appears well-developed and well-nourished.  HENT:  Head: Normocephalic.  Nose: Nose normal.  Mouth/Throat: Oropharynx is clear and moist.  Eyes: Conjunctivae are normal. Pupils are equal, round, and reactive to light.  Neck: Normal range of motion. Neck supple. No JVD present.  Cardiovascular: Normal rate, regular rhythm, S1 normal, S2 normal, normal heart sounds and intact distal pulses.  Exam reveals no gallop and no friction rub.   No murmur heard. Pulmonary/Chest: Effort normal and breath sounds normal. No respiratory distress. He has no wheezes. He has no rales. He exhibits no tenderness.  Abdominal: Soft. Bowel sounds are normal. He exhibits no distension. There is no tenderness.  Musculoskeletal: Normal range  of motion. He exhibits no edema and no tenderness.  Lymphadenopathy:    He has no cervical adenopathy.  Neurological: He is alert and oriented to person, place, and time. Coordination normal.  Skin: Skin is warm and dry. No rash noted. No erythema.  Psychiatric: He has a normal mood and affect. His behavior is normal. Judgment and thought content normal.           Assessment and Plan

## 2011-11-08 NOTE — Patient Instructions (Addendum)
You are doing well.  Please restart simvastatin 40 mg daily Take daily (even if you need to take in the Am).  In three months, decrease aspirin to 81 mg x 2 (instead of 325 mg)  Please call us if you have new issues that need to be addressed before your next appt.  The office will contact you for a follow up Appt. In 12 months

## 2012-12-31 ENCOUNTER — Ambulatory Visit (INDEPENDENT_AMBULATORY_CARE_PROVIDER_SITE_OTHER): Payer: Medicare Other | Admitting: Cardiovascular Disease

## 2012-12-31 ENCOUNTER — Encounter: Payer: Self-pay | Admitting: Cardiovascular Disease

## 2012-12-31 VITALS — BP 130/72 | HR 57 | Ht 66.0 in | Wt 213.5 lb

## 2012-12-31 DIAGNOSIS — E785 Hyperlipidemia, unspecified: Secondary | ICD-10-CM

## 2012-12-31 DIAGNOSIS — R079 Chest pain, unspecified: Secondary | ICD-10-CM

## 2012-12-31 DIAGNOSIS — I10 Essential (primary) hypertension: Secondary | ICD-10-CM

## 2012-12-31 DIAGNOSIS — R0602 Shortness of breath: Secondary | ICD-10-CM

## 2012-12-31 NOTE — Assessment & Plan Note (Addendum)
We have recommended we push for goal total cholesterol less than 150, LDL less than 70

## 2012-12-31 NOTE — Assessment & Plan Note (Signed)
Blood pressure is well controlled on today's visit. No changes made to the medications. 

## 2012-12-31 NOTE — Patient Instructions (Addendum)
You are doing well. No medication changes were made.  We will schedule you for a cardiac cath based on lab results Return to office for lab results Friday 01/04/13  Please call us if you have new issues that need to be addressed before your next appt.  Your physician wants you to follow-up in: 6 months.  You will receive a reminder letter in the mail two months in advance. If you don't receive a letter, please call our office to schedule the follow-up appointment.

## 2012-12-31 NOTE — Progress Notes (Signed)
Patient ID: [name] [name], male    DOB: 08-May-1931, 79 y.o.   MRN: [id number]  HPI Comments: [name] [name] is a pleasant 79 year old gentleman with a history of coronary artery disease, prior stenting to his mid LAD, hypertension, hyperlipidemia, chronic kidney disease who was admitted to the hospital October 21, 2011 with chest pain, shortness of breath, weakness ,cardiac cardiac catheterization was performed with PCI of the OM 2.  Notes also indicate chronic kidney disease, stage III, asymptomatic bradycardia, obesity, right hip replacement 2008, thrombocytopenia, anterior ST elevation MI December 2008 secondary to stent thrombosis in the setting of stopping Plavix treated with bare-metal stenting. Prior DES stent to the LAD in 2006  He presents today and reports that he does not feel well. He and his wife report that he has increasing shortness of breath and chest tightness on the left. He was given nitroglycerin recently by Dr. Ramonita Lab. He also has other twinges in his chest and he is concerned about his previous stents. He denies any significant lower, edema Most recent creatinine 1.7.  Prior cardiac catheterization October 2012 showed left main shows no disease, LAD has 30% proximal disease, 2 overlapping stents in the mid region, 95% ostial diagonal disease jailed by the stents, 95% proximal OM 2 disease, mild plaque in the RCA, hypokinesis of the anterolateral wall, apical region and inferoapical wall with ejection fraction 30-35%.  EKG shows normal sinus rhythm with rate 57 beats per minute with interventricular conduction delay, T-wave abnormality in V4 through V6, one and aVL, interventricular conduction delay, left axis deviation  Echocardiogram October 20, 2011 shows ejection fraction 45%, apical hypokinesis, mild LVH, mildly dilated left atrium   Outpatient Encounter Prescriptions as of 12/31/2012  Medication Sig Dispense Refill  . amLODipine (NORVASC) 5 MG tablet Take 5 mg by  mouth daily.        Marland Kitchen aspirin 325 MG tablet Take 325 mg by mouth daily.        . carvedilol (COREG) 6.25 MG tablet Take 6.25 mg by mouth 2 (two) times daily with a meal.        . clopidogrel (PLAVIX) 75 MG tablet Take 75 mg by mouth daily.        Marland Kitchen lisinopril (PRINIVIL,ZESTRIL) 20 MG tablet Take 20 mg by mouth daily.      . simvastatin (ZOCOR) 40 MG tablet Take 1 tablet (40 mg total) by mouth every evening.  90 tablet  4  . [DISCONTINUED] lisinopril (PRINIVIL,ZESTRIL) 10 MG tablet Take 10 mg by mouth daily.          Review of Systems  Constitutional: Negative.   HENT: Negative.   Eyes: Negative.   Respiratory: Positive for shortness of breath.   Cardiovascular: Positive for chest pain.  Gastrointestinal: Negative.   Musculoskeletal: Negative.   Skin: Negative.   Neurological: Negative.   Hematological: Negative.   Psychiatric/Behavioral: Negative.   All other systems reviewed and are negative.    BP 130/72  Pulse 57  Ht 5\' 6"  (1.676 m)  Wt 213 lb 8 oz (96.843 kg)  BMI 34.46 kg/m2  Physical Exam  Nursing note and vitals reviewed. Constitutional: He is oriented to person, place, and time. He appears well-developed and well-nourished.  HENT:  Head: Normocephalic.  Nose: Nose normal.  Mouth/Throat: Oropharynx is clear and moist.  Eyes: Conjunctivae normal are normal. Pupils are equal, round, and reactive to light.  Neck: Normal range of motion. Neck supple. No JVD present.  Cardiovascular: Normal rate, regular  rhythm, S1 normal, S2 normal, normal heart sounds and intact distal pulses.  Exam reveals no gallop and no friction rub.   No murmur heard. Pulmonary/Chest: Effort normal and breath sounds normal. No respiratory distress. He has no wheezes. He has no rales. He exhibits no tenderness.  Abdominal: Soft. Bowel sounds are normal. He exhibits no distension. There is no tenderness.  Musculoskeletal: Normal range of motion. He exhibits no edema and no tenderness.    Lymphadenopathy:    He has no cervical adenopathy.  Neurological: He is alert and oriented to person, place, and time. Coordination normal.  Skin: Skin is warm and dry. No rash noted. No erythema.  Psychiatric: He has a normal mood and affect. His behavior is normal. Judgment and thought content normal.           Assessment and Plan

## 2012-12-31 NOTE — Assessment & Plan Note (Addendum)
Chest pain is concerning for angina. Recent creatinine 1.7. We did discuss the various treatment options. As his BUN is also elevated at 28, we have suggested he forced fluids for the next several days and we will recheck his creatinine once hydrated. If we can achieve creatinine 1.6 or better, we could consider cardiac catheterization which is what he would like. If creatinine continues to be elevated, we might recommend pharmacologic Myoview. We will try to decide this by the end of this week or early next week once lab work returns. He has not had to take nitroglycerin yet for chest pain.

## 2013-01-04 ENCOUNTER — Ambulatory Visit (INDEPENDENT_AMBULATORY_CARE_PROVIDER_SITE_OTHER): Payer: Medicare Other

## 2013-01-04 DIAGNOSIS — R0602 Shortness of breath: Secondary | ICD-10-CM

## 2013-01-04 DIAGNOSIS — R079 Chest pain, unspecified: Secondary | ICD-10-CM

## 2013-01-05 LAB — BASIC METABOLIC PANEL
BUN/Creatinine Ratio: 15 (ref 10–22)
BUN: 25 mg/dL (ref 8–27)
CO2: 22 mmol/L (ref 19–28)
Creatinine, Ser: 1.71 mg/dL — ABNORMAL HIGH (ref 0.76–1.27)
GFR calc Af Amer: 43 mL/min/{1.73_m2} — ABNORMAL LOW (ref >59)
GFR calc non Af Amer: 37 mL/min/{1.73_m2} — ABNORMAL LOW (ref >59)
Sodium: 139 mmol/L (ref 134–144)

## 2013-01-07 ENCOUNTER — Other Ambulatory Visit: Payer: Self-pay

## 2013-01-07 DIAGNOSIS — R079 Chest pain, unspecified: Secondary | ICD-10-CM

## 2013-01-10 ENCOUNTER — Ambulatory Visit: Payer: Self-pay | Admitting: Cardiovascular Disease

## 2013-01-10 DIAGNOSIS — R079 Chest pain, unspecified: Secondary | ICD-10-CM

## 2013-07-11 ENCOUNTER — Encounter: Payer: Self-pay | Admitting: *Deleted

## 2013-07-15 ENCOUNTER — Ambulatory Visit (INDEPENDENT_AMBULATORY_CARE_PROVIDER_SITE_OTHER): Payer: Medicare Other | Admitting: Cardiovascular Disease

## 2013-07-15 ENCOUNTER — Encounter: Payer: Self-pay | Admitting: Cardiovascular Disease

## 2013-07-15 VITALS — BP 130/68 | HR 47 | Ht 66.0 in | Wt 210.8 lb

## 2013-07-15 DIAGNOSIS — R0602 Shortness of breath: Secondary | ICD-10-CM

## 2013-07-15 DIAGNOSIS — I10 Essential (primary) hypertension: Secondary | ICD-10-CM

## 2013-07-15 DIAGNOSIS — I2510 Atherosclerotic heart disease of native coronary artery without angina pectoris: Secondary | ICD-10-CM

## 2013-07-15 DIAGNOSIS — E785 Hyperlipidemia, unspecified: Secondary | ICD-10-CM

## 2013-07-15 NOTE — Assessment & Plan Note (Signed)
Currently with no symptoms of angina. No further workup at this time. Continue current medication regimen. Negative stress test January 2014.

## 2013-07-15 NOTE — Assessment & Plan Note (Signed)
Cholesterol is at goal on the current lipid regimen. No changes to the medications were made.  

## 2013-07-15 NOTE — Progress Notes (Signed)
Patient ID: [name] [name], male    DOB: [birth year], 79 y.o.   MRN: [id number]  HPI Comments: [name] [name] is a pleasant 79 year old gentleman with a history of coronary artery disease, prior stenting to his mid LAD, hypertension, hyperlipidemia, chronic kidney disease who was admitted to the hospital October 21, 2011 with chest pain, shortness of breath, weakness ,cardiac cardiac catheterization was performed with PCI of the OM 2.  Notes also indicate chronic kidney disease, stage III, asymptomatic bradycardia, obesity, right hip replacement 2008, thrombocytopenia, anterior ST elevation MI December 2008 secondary to stent thrombosis in the setting of stopping Plavix treated with bare-metal stenting. Prior DES stent to the LAD in 2006  On his last clinic visit 6 months ago, he had chest discomfort and shortness of breath. Stress test was performed that showed no significant ischemia. Study was dated 01/10/2013. Old scar and the mid to distal anterior wall, apical region.   Since then he reports he is been doing well. He is active with no complaints. He does have some occasional fatigue. Otherwise he is active, does occasional exercise.  He has not been needing any nitroglycerin Most recent creatinine 1.9.  Prior cardiac catheterization October 2012 showed left main shows no disease, LAD has 30% proximal disease, 2 overlapping stents in the mid region, 95% ostial diagonal disease jailed by the stents, 95% proximal OM 2 disease, mild plaque in the RCA, hypokinesis of the anterolateral wall, apical region and inferoapical wall with ejection fraction 30-35%.  EKG shows normal sinus rhythm with rate 47 beats per minute with left bundle branch block   Lab work from June 2014 shows total cholesterol 94, LDL 42, HDL 28  Echocardiogram October 20, 2011 shows ejection fraction 45%, apical hypokinesis, mild LVH, mildly dilated left atrium   Outpatient Encounter Prescriptions as of 07/15/2013  Medication  Sig Dispense Refill  . amLODipine (NORVASC) 5 MG tablet Take 5 mg by mouth daily.        Marland Kitchen aspirin 325 MG tablet Take 325 mg by mouth daily.        . carvedilol (COREG) 6.25 MG tablet Take 6.25 mg by mouth 2 (two) times daily with a meal.        . clopidogrel (PLAVIX) 75 MG tablet Take 75 mg by mouth daily.        Marland Kitchen lisinopril (PRINIVIL,ZESTRIL) 10 MG tablet Take 20 mg by mouth daily.      . simvastatin (ZOCOR) 40 MG tablet Take 1 tablet (40 mg total) by mouth every evening.  90 tablet  4  . [DISCONTINUED] lisinopril (PRINIVIL,ZESTRIL) 20 MG tablet Take 20 mg by mouth daily.       No facility-administered encounter medications on file as of 07/15/2013.    Review of Systems  Constitutional: Negative.   HENT: Negative.   Eyes: Negative.   Gastrointestinal: Negative.   Musculoskeletal: Negative.   Skin: Negative.   Neurological: Negative.   Psychiatric/Behavioral: Negative.   All other systems reviewed and are negative.    BP 130/68  Pulse 47  Ht 5\' 6"  (1.676 m)  Wt 210 lb 12 oz (95.596 kg)  BMI 34.03 kg/m2  Physical Exam  Nursing note and vitals reviewed. Constitutional: He is oriented to person, place, and time. He appears well-developed and well-nourished.  HENT:  Head: Normocephalic.  Nose: Nose normal.  Mouth/Throat: Oropharynx is clear and moist.  Eyes: Conjunctivae are normal. Pupils are equal, round, and reactive to light.  Neck: Normal range of motion. Neck  supple. No JVD present.  Cardiovascular: Normal rate, regular rhythm, S1 normal, S2 normal, normal heart sounds and intact distal pulses.  Exam reveals no gallop and no friction rub.   No murmur heard. Pulmonary/Chest: Effort normal and breath sounds normal. No respiratory distress. He has no wheezes. He has no rales. He exhibits no tenderness.  Abdominal: Soft. Bowel sounds are normal. He exhibits no distension. There is no tenderness.  Musculoskeletal: Normal range of motion. He exhibits no edema and no  tenderness.  Lymphadenopathy:    He has no cervical adenopathy.  Neurological: He is alert and oriented to person, place, and time. Coordination normal.  Skin: Skin is warm and dry. No rash noted. No erythema.  Psychiatric: He has a normal mood and affect. His behavior is normal. Judgment and thought content normal.      Assessment and Plan

## 2013-07-15 NOTE — Assessment & Plan Note (Signed)
Blood pressure is well controlled on today's visit. No changes made to the medications. 

## 2013-07-15 NOTE — Patient Instructions (Addendum)
You are doing well. Please decrease the coreg/carvedilolo to 3.125 mg twice a day (cut the pill in half) Monitor your heart rate If it continues to run low, call the office  Please call us if you have new issues that need to be addressed before your next appt.  Your physician wants you to follow-up in: 6 months.  You will receive a reminder letter in the mail two months in advance. If you don't receive a letter, please call our office to schedule the follow-up appointment.

## 2013-07-16 ENCOUNTER — Encounter: Payer: Self-pay | Admitting: *Deleted

## 2013-07-25 ENCOUNTER — Other Ambulatory Visit: Payer: Self-pay

## 2013-07-25 DIAGNOSIS — R079 Chest pain, unspecified: Secondary | ICD-10-CM

## 2014-01-13 ENCOUNTER — Ambulatory Visit: Payer: Medicare Other | Admitting: Cardiovascular Disease

## 2014-01-30 ENCOUNTER — Encounter: Payer: Self-pay | Admitting: Cardiovascular Disease

## 2014-01-30 ENCOUNTER — Ambulatory Visit (INDEPENDENT_AMBULATORY_CARE_PROVIDER_SITE_OTHER): Payer: Medicare Other | Admitting: Cardiovascular Disease

## 2014-01-30 VITALS — BP 130/66 | HR 48 | Ht 66.0 in | Wt 213.2 lb

## 2014-01-30 DIAGNOSIS — R0602 Shortness of breath: Secondary | ICD-10-CM

## 2014-01-30 DIAGNOSIS — R079 Chest pain, unspecified: Secondary | ICD-10-CM

## 2014-01-30 DIAGNOSIS — E785 Hyperlipidemia, unspecified: Secondary | ICD-10-CM

## 2014-01-30 DIAGNOSIS — I2510 Atherosclerotic heart disease of native coronary artery without angina pectoris: Secondary | ICD-10-CM

## 2014-01-30 DIAGNOSIS — I10 Essential (primary) hypertension: Secondary | ICD-10-CM

## 2014-01-30 MED ORDER — LISINOPRIL 20 MG PO TABS: 20.0000 mg | ORAL_TABLET | Freq: Every day | ORAL | Status: DC

## 2014-01-30 MED ORDER — CARVEDILOL 6.25 MG PO TABS: 6.2500 mg | ORAL_TABLET | Freq: Two times a day (BID) | ORAL | Status: DC

## 2014-01-30 NOTE — Assessment & Plan Note (Signed)
Cholesterol is at goal on the current lipid regimen. No changes to the medications were made.  

## 2014-01-30 NOTE — Assessment & Plan Note (Signed)
Blood pressure is well controlled on today's visit. No changes made to the medications. 

## 2014-01-30 NOTE — Assessment & Plan Note (Signed)
No further episodes of chest pain. No further workup needed

## 2014-01-30 NOTE — Assessment & Plan Note (Signed)
Currently with no symptoms of angina. No further workup at this time. Continue current medication regimen. 

## 2014-01-30 NOTE — Progress Notes (Signed)
Patient ID: [name] [name], male    DOB: Mar 16, 1931, 80 y.o.   MRN: [id number]  HPI Comments: [name] [name] is a pleasant 80 year old gentleman with a history of coronary artery disease, prior stenting to his mid LAD, hypertension, hyperlipidemia, chronic kidney disease who was admitted to the hospital October 21, 2011 with chest pain, shortness of breath, weakness ,cardiac cardiac catheterization was performed with PCI of the OM 2.   chronic kidney disease, stage III, asymptomatic bradycardia, obesity, right hip replacement 2008, thrombocytopenia, anterior ST elevation MI December 2008 secondary to stent thrombosis in the setting of stopping Plavix treated with bare-metal stenting. Prior DES stent to the LAD in 2006  Followup today, he reports that he is doing well. He denies any significant chest pain or shortness of breath. Prior stress test 01/10/2013 that showed Old scar and the mid to distal anterior wall, apical region.  Today he reports that he is active with no complaints. He does have some occasional fatigue. No regular exercise apart from golf He has not been needing any nitroglycerin Previous creatinine 1.9.  Prior cardiac catheterization October 2012 showed left main shows no disease, LAD has 30% proximal disease, 2 overlapping stents in the mid region, 95% ostial diagonal disease jailed by the stents, 95% proximal OM 2 disease, mild plaque in the RCA, hypokinesis of the anterolateral wall, apical region and inferoapical wall with ejection fraction 30-35%.  EKG shows normal sinus rhythm with rate 48 beats per minute with ST and T wave abnormality who V3 to V6, inferior leads consistent with ischemia  Lab work from June 2014 shows total cholesterol 94, LDL 42, HDL 28 Lab work December 2014 shows total cholesterol 120  Echocardiogram October 20, 2011 shows ejection fraction 45%, apical hypokinesis, mild LVH, mildly dilated left atrium   Outpatient Encounter Prescriptions as of  78/04/2014  Medication Sig  . amLODipine (NORVASC) 5 MG tablet Take 5 mg by mouth daily.    Marland Kitchen aspirin 325 MG tablet Take 325 mg by mouth daily.    . carvedilol (COREG) 6.25 MG tablet Take 0.5 mg by mouth 2 (two) times daily with a meal.   . clopidogrel (PLAVIX) 75 MG tablet Take 75 mg by mouth daily.    Marland Kitchen lisinopril (PRINIVIL,ZESTRIL) 10 MG tablet Take 20 mg by mouth daily.  . simvastatin (ZOCOR) 40 MG tablet Take 1 tablet (40 mg total) by mouth every evening.    Review of Systems  Constitutional: Negative.   HENT: Negative.   Eyes: Negative.   Respiratory: Negative.   Cardiovascular: Negative.   Gastrointestinal: Negative.   Endocrine: Negative.   Musculoskeletal: Negative.   Skin: Negative.   Allergic/Immunologic: Negative.   Neurological: Negative.   Hematological: Negative.   Psychiatric/Behavioral: Negative.   All other systems reviewed and are negative.    BP 130/66  Pulse 48  Ht 5\' 6"  (1.676 m)  Wt 213 lb 4 oz (96.73 kg)  BMI 34.44 kg/m2  Physical Exam  Nursing note and vitals reviewed. Constitutional: He is oriented to person, place, and time. He appears well-developed and well-nourished.  HENT:  Head: Normocephalic.  Nose: Nose normal.  Mouth/Throat: Oropharynx is clear and moist.  Eyes: Conjunctivae are normal. Pupils are equal, round, and reactive to light.  Neck: Normal range of motion. Neck supple. No JVD present.  Cardiovascular: Normal rate, regular rhythm, S1 normal, S2 normal, normal heart sounds and intact distal pulses.  Exam reveals no gallop and no friction rub.   No murmur heard.  Pulmonary/Chest: Effort normal and breath sounds normal. No respiratory distress. He has no wheezes. He has no rales. He exhibits no tenderness.  Abdominal: Soft. Bowel sounds are normal. He exhibits no distension. There is no tenderness.  Musculoskeletal: Normal range of motion. He exhibits no edema and no tenderness.  Lymphadenopathy:    He has no cervical adenopathy.   Neurological: He is alert and oriented to person, place, and time. Coordination normal.  Skin: Skin is warm and dry. No rash noted. No erythema.  Psychiatric: He has a normal mood and affect. His behavior is normal. Judgment and thought content normal.      Assessment and Plan

## 2014-01-30 NOTE — Patient Instructions (Signed)
You are doing well. No medication changes were made.  Please call us if you have new issues that need to be addressed before your next appt.  Your physician wants you to follow-up in: 6 months.  You will receive a reminder letter in the mail two months in advance. If you don't receive a letter, please call our office to schedule the follow-up appointment.   

## 2015-03-09 ENCOUNTER — Telehealth: Payer: Self-pay | Admitting: Cardiovascular Disease

## 2015-03-09 NOTE — Telephone Encounter (Signed)
Spoke w/ pt.  He states that he is "just getting older and having some SOB".  She states that he just called for an appt, that he did not need a call back unless there was a sooner appt.  Asked him to call back if he has further questions or concerns before his appt.

## 2015-03-09 NOTE — Telephone Encounter (Signed)
Patient called to schedule appt. OD 12 m fu. Patient complains of some SOB that has been happening for a while.  Pt c/o Shortness Of Breath: STAT if SOB developed within the last 24 hours or pt is noticeably SOB on the phone  1. Are you currently SOB (can you hear that pt is SOB on the phone)? no  2. How long have you been experiencing SOB? "oh, I don't know a while"  3. Are you SOB when sitting or when up moving around? Moving around gets better when resting  4. Are you currently experiencing any other symptoms? no

## 2015-03-25 ENCOUNTER — Encounter: Payer: Self-pay | Admitting: Cardiovascular Disease

## 2015-03-25 ENCOUNTER — Ambulatory Visit (INDEPENDENT_AMBULATORY_CARE_PROVIDER_SITE_OTHER): Payer: PPO | Admitting: Cardiovascular Disease

## 2015-03-25 VITALS — BP 135/70 | HR 55 | Ht 66.0 in | Wt 214.5 lb

## 2015-03-25 DIAGNOSIS — I25119 Atherosclerotic heart disease of native coronary artery with unspecified angina pectoris: Secondary | ICD-10-CM | POA: Diagnosis not present

## 2015-03-25 DIAGNOSIS — E785 Hyperlipidemia, unspecified: Secondary | ICD-10-CM

## 2015-03-25 DIAGNOSIS — R0602 Shortness of breath: Secondary | ICD-10-CM

## 2015-03-25 DIAGNOSIS — I10 Essential (primary) hypertension: Secondary | ICD-10-CM | POA: Diagnosis not present

## 2015-03-25 NOTE — Patient Instructions (Addendum)
You are doing well.  Please decrease the aspirin to 81 mg daily Stay on plavix  Please call us if you have new issues that need to be addressed before your next appt.  Your physician wants you to follow-up in: 6 months.  You will receive a reminder letter in the mail two months in advance. If you don't receive a letter, please call our office to schedule the follow-up appointment.

## 2015-03-25 NOTE — Progress Notes (Signed)
Patient ID: LAURICE KIMMONS, male    DOB: 05-23-1931, 81 y.o.   MRN: [id number]  HPI Comments: Mr. Kiser is a pleasant 81 year old gentleman with a history of coronary artery disease, prior stenting to his mid LAD, hypertension, hyperlipidemia, chronic kidney disease who was admitted to the hospital October 21, 2011 with chest pain, shortness of breath, weakness ,cardiac cardiac catheterization was performed with PCI of the OM 2. He presents today for follow-up of his coronary artery disease  In general he reports that he feels well. He does have chronic baseline shortness of breath. He tries to stay active. Recently working in his garden cutting up some branches. Significant bruising on his arms. He is taking Coreg 3.125 mill grams twice a day. Previously noted to have bradycardia. Denies any lightheadedness or dizziness, no chest pain symptoms Blood pressure sometimes elevated, decreases down after recheck. Same issue when seen by primary care recently  EKG on today's visit shows normal sinus rhythm with rate 55 bpm, left bundle branch block, no significant changes  Other past medical history  chronic kidney disease, stage III, asymptomatic bradycardia, obesity, right hip replacement 2008, thrombocytopenia, anterior ST elevation MI December 2008 secondary to stent thrombosis in the setting of stopping Plavix treated with bare-metal stenting. Prior DES stent to the LAD in 2006  Prior stress test 01/10/2013 that showed Old scar and the mid to distal anterior wall, apical region.  Previous creatinine 1.9.  Prior cardiac catheterization October 2012 showed left main shows no disease, LAD has 30% proximal disease, 2 overlapping stents in the mid region, 95% ostial diagonal disease jailed by the stents, 95% proximal OM 2 disease, mild plaque in the RCA, hypokinesis of the anterolateral wall, apical region and inferoapical wall with ejection fraction 30-35%.  EKG shows normal sinus rhythm with  rate 48 beats per minute with ST and T wave abnormality who V3 to V6, inferior leads consistent with ischemia  Lab work from June 2014 shows total cholesterol 94, LDL 42, HDL 28 Lab work December 2014 shows total cholesterol 120  Echocardiogram October 20, 2011 shows ejection fraction 45%, apical hypokinesis, mild LVH, mildly dilated left atrium   No Known Allergies  Outpatient Encounter Prescriptions as of 03/25/2015  Medication Sig  . amLODipine (NORVASC) 5 MG tablet Take 5 mg by mouth daily.    Marland Kitchen aspirin 81 MG tablet Take 1 tablet (81 mg total) by mouth daily.  . carvedilol (COREG) 6.25 MG tablet Take 1 tablet (6.25 mg total) by mouth 2 (two) times daily with a meal.  . clopidogrel (PLAVIX) 75 MG tablet Take 75 mg by mouth daily.    Marland Kitchen lisinopril (PRINIVIL,ZESTRIL) 20 MG tablet Take 1 tablet (20 mg total) by mouth daily.  . simvastatin (ZOCOR) 40 MG tablet Take 1 tablet (40 mg total) by mouth every evening.  . [DISCONTINUED] aspirin 325 MG tablet Take 325 mg by mouth daily.      Past Medical History  Diagnosis Date  . Hypertension   . Hyperlipidemia   . Tobacco user     Remote  . Renal insufficiency, mild   . Sinus bradycardia     Assymptomatic  . CAD (coronary artery disease) 10/21/2011    s/p stent placement    Past Surgical History  Procedure Laterality Date  . Transurethral resection of prostate    . Total hip arthroplasty      Right  . Cardiac catheterization  10/21/2011    s/p stent placement  . Cardiac catheterization  2008    stent placement  . Cardiac catheterization  2006    stent placement    Social History  reports that he quit smoking about 46 years ago. His smoking use included Cigarettes. He has a 20 pack-year smoking history. He does not have any smokeless tobacco history on file. He reports that he does not drink alcohol or use illicit drugs.  Family History Family history is unknown by patient.  Review of Systems  Constitutional: Negative.    Respiratory: Negative.   Cardiovascular: Negative.   Gastrointestinal: Negative.   Musculoskeletal: Negative.   Skin: Negative.   Neurological: Negative.   Hematological: Negative.   Psychiatric/Behavioral: Negative.   All other systems reviewed and are negative.   BP 135/70 mmHg  Pulse 55  Ht 5\' 6"  (1.676 m)  Wt 214 lb 8 oz (97.297 kg)  BMI 34.64 kg/m2  Physical Exam  Constitutional: He is oriented to person, place, and time. He appears well-developed and well-nourished.  HENT:  Head: Normocephalic.  Nose: Nose normal.  Mouth/Throat: Oropharynx is clear and moist.  Eyes: Conjunctivae are normal. Pupils are equal, round, and reactive to light.  Neck: Normal range of motion. Neck supple. No JVD present.  Cardiovascular: Normal rate, regular rhythm, S1 normal, S2 normal, normal heart sounds and intact distal pulses.  Exam reveals no gallop and no friction rub.   No murmur heard. Pulmonary/Chest: Effort normal and breath sounds normal. No respiratory distress. He has no wheezes. He has no rales. He exhibits no tenderness.  Abdominal: Soft. Bowel sounds are normal. He exhibits no distension. There is no tenderness.  Musculoskeletal: Normal range of motion. He exhibits no edema or tenderness.  Lymphadenopathy:    He has no cervical adenopathy.  Neurological: He is alert and oriented to person, place, and time. Coordination normal.  Skin: Skin is warm and dry. No rash noted. No erythema.  Psychiatric: He has a normal mood and affect. His behavior is normal. Judgment and thought content normal.      Assessment and Plan   Nursing note and vitals reviewed.

## 2015-03-25 NOTE — Assessment & Plan Note (Signed)
Blood pressure is well controlled on today's visit. No changes made to the medications. 

## 2015-03-25 NOTE — Assessment & Plan Note (Signed)
Cholesterol is at goal on the current lipid regimen. No changes to the medications were made.  

## 2015-03-25 NOTE — Assessment & Plan Note (Signed)
Chronic shortness of breath, prior smoking history but stopped 40 years ago. Some component of deconditioning, obesity. We have recommended if symptoms get worse that he call our office for stress testing to rule out ischemia Last stress test Jerry 2014

## 2015-03-25 NOTE — Assessment & Plan Note (Signed)
Currently with no symptoms of angina. No further workup at this time. Continue current medication regimen. 

## 2015-08-03 ENCOUNTER — Encounter (HOSPITAL_COMMUNITY): Payer: Self-pay | Admitting: *Deleted

## 2015-08-03 ENCOUNTER — Observation Stay (HOSPITAL_COMMUNITY): Payer: PPO

## 2015-08-03 ENCOUNTER — Emergency Department (HOSPITAL_COMMUNITY): Payer: PPO

## 2015-08-03 ENCOUNTER — Observation Stay (HOSPITAL_COMMUNITY)
Admission: EM | Admit: 2015-08-03 | Discharge: 2015-08-04 | Disposition: A | Discharge status: Home / Self Care | Payer: PPO | Attending: Family Medicine | Admitting: Family Medicine

## 2015-08-03 DIAGNOSIS — Z7982 Long term (current) use of aspirin: Secondary | ICD-10-CM | POA: Insufficient documentation

## 2015-08-03 DIAGNOSIS — I77811 Abdominal aortic ectasia: Secondary | ICD-10-CM | POA: Insufficient documentation

## 2015-08-03 DIAGNOSIS — Z79899 Other long term (current) drug therapy: Secondary | ICD-10-CM | POA: Diagnosis not present

## 2015-08-03 DIAGNOSIS — R079 Chest pain, unspecified: Secondary | ICD-10-CM | POA: Diagnosis not present

## 2015-08-03 DIAGNOSIS — I129 Hypertensive chronic kidney disease with stage 1 through stage 4 chronic kidney disease, or unspecified chronic kidney disease: Secondary | ICD-10-CM | POA: Diagnosis not present

## 2015-08-03 DIAGNOSIS — R1031 Right lower quadrant pain: Secondary | ICD-10-CM | POA: Diagnosis not present

## 2015-08-03 DIAGNOSIS — I10 Essential (primary) hypertension: Secondary | ICD-10-CM | POA: Diagnosis not present

## 2015-08-03 DIAGNOSIS — D696 Thrombocytopenia, unspecified: Secondary | ICD-10-CM | POA: Diagnosis not present

## 2015-08-03 DIAGNOSIS — Z87891 Personal history of nicotine dependence: Secondary | ICD-10-CM | POA: Diagnosis not present

## 2015-08-03 DIAGNOSIS — R1032 Left lower quadrant pain: Secondary | ICD-10-CM | POA: Insufficient documentation

## 2015-08-03 DIAGNOSIS — E785 Hyperlipidemia, unspecified: Secondary | ICD-10-CM | POA: Diagnosis not present

## 2015-08-03 DIAGNOSIS — I2510 Atherosclerotic heart disease of native coronary artery without angina pectoris: Secondary | ICD-10-CM | POA: Diagnosis present

## 2015-08-03 DIAGNOSIS — R0602 Shortness of breath: Secondary | ICD-10-CM | POA: Insufficient documentation

## 2015-08-03 DIAGNOSIS — N183 Chronic kidney disease, stage 3 (moderate): Secondary | ICD-10-CM | POA: Diagnosis not present

## 2015-08-03 DIAGNOSIS — I252 Old myocardial infarction: Secondary | ICD-10-CM | POA: Diagnosis not present

## 2015-08-03 DIAGNOSIS — Z7902 Long term (current) use of antithrombotics/antiplatelets: Secondary | ICD-10-CM | POA: Diagnosis not present

## 2015-08-03 DIAGNOSIS — R0789 Other chest pain: Principal | ICD-10-CM | POA: Insufficient documentation

## 2015-08-03 DIAGNOSIS — Z955 Presence of coronary angioplasty implant and graft: Secondary | ICD-10-CM | POA: Diagnosis not present

## 2015-08-03 DIAGNOSIS — I25119 Atherosclerotic heart disease of native coronary artery with unspecified angina pectoris: Secondary | ICD-10-CM | POA: Diagnosis not present

## 2015-08-03 DIAGNOSIS — J42 Unspecified chronic bronchitis: Secondary | ICD-10-CM | POA: Diagnosis not present

## 2015-08-03 LAB — COMPREHENSIVE METABOLIC PANEL
ALBUMIN: 3.8 g/dL (ref 3.5–5.0)
ALT: 17 U/L (ref 17–63)
ANION GAP: 12 (ref 5–15)
AST: 20 U/L (ref 15–41)
Alkaline Phosphatase: 42 U/L (ref 38–126)
BILIRUBIN TOTAL: 0.8 mg/dL (ref 0.3–1.2)
BUN: 33 mg/dL — ABNORMAL HIGH (ref 6–20)
CALCIUM: 8.7 mg/dL — AB (ref 8.9–10.3)
CHLORIDE: 106 mmol/L (ref 101–111)
CO2: 19 mmol/L — ABNORMAL LOW (ref 22–32)
Creatinine, Ser: 1.82 mg/dL — ABNORMAL HIGH (ref 0.61–1.24)
GFR calc Af Amer: 38 mL/min — ABNORMAL LOW (ref >60)
GFR calc non Af Amer: 33 mL/min — ABNORMAL LOW (ref >60)
Glucose, Bld: 137 mg/dL — ABNORMAL HIGH (ref 65–99)
Potassium: 5.1 mmol/L (ref 3.5–5.1)
Sodium: 137 mmol/L (ref 135–145)
TOTAL PROTEIN: 6.7 g/dL (ref 6.5–8.1)

## 2015-08-03 LAB — CBC WITH DIFFERENTIAL/PLATELET
Basophils Absolute: 0 10*3/uL (ref 0.0–0.1)
Basophils Relative: 0 % (ref 0–1)
Eosinophils Absolute: 0.5 10*3/uL (ref 0.0–0.7)
Eosinophils Relative: 7 % — ABNORMAL HIGH (ref 0–5)
HCT: 46 % (ref 39.0–52.0)
Hemoglobin: 15.6 g/dL (ref 13.0–17.0)
LYMPHS PCT: 20 % (ref 12–46)
Lymphs Abs: 1.5 10*3/uL (ref 0.7–4.0)
MCH: 33.9 pg (ref 26.0–34.0)
MCHC: 33.9 g/dL (ref 30.0–36.0)
MCV: 100 fL (ref 78.0–100.0)
Monocytes Absolute: 0.3 10*3/uL (ref 0.1–1.0)
Monocytes Relative: 5 % (ref 3–12)
Neutro Abs: 5.1 10*3/uL (ref 1.7–7.7)
Neutrophils Relative %: 68 % (ref 43–77)
Platelets: 124 10*3/uL — ABNORMAL LOW (ref 150–400)
RBC: 4.6 MIL/uL (ref 4.22–5.81)
RDW: 13.6 % (ref 11.5–15.5)
WBC: 7.5 10*3/uL (ref 4.0–10.5)

## 2015-08-03 LAB — CREATININE, SERUM
Creatinine, Ser: 1.75 mg/dL — ABNORMAL HIGH (ref 0.61–1.24)
GFR calc Af Amer: 40 mL/min — ABNORMAL LOW (ref >60)
GFR calc non Af Amer: 35 mL/min — ABNORMAL LOW (ref >60)

## 2015-08-03 LAB — CBC
HCT: 45.8 % (ref 39.0–52.0)
HEMOGLOBIN: 15.6 g/dL (ref 13.0–17.0)
MCH: 33.5 pg (ref 26.0–34.0)
MCHC: 34.1 g/dL (ref 30.0–36.0)
MCV: 98.5 fL (ref 78.0–100.0)
Platelets: 140 10*3/uL — ABNORMAL LOW (ref 150–400)
RBC: 4.65 MIL/uL (ref 4.22–5.81)
RDW: 13.6 % (ref 11.5–15.5)
WBC: 8 10*3/uL (ref 4.0–10.5)

## 2015-08-03 LAB — URINALYSIS, ROUTINE W REFLEX MICROSCOPIC
Bilirubin Urine: NEGATIVE
Glucose, UA: NEGATIVE mg/dL
Hgb urine dipstick: NEGATIVE
Ketones, ur: NEGATIVE mg/dL
Leukocytes, UA: NEGATIVE
Nitrite: NEGATIVE
Protein, ur: NEGATIVE mg/dL
SPECIFIC GRAVITY, URINE: 1.018 (ref 1.005–1.030)
UROBILINOGEN UA: 0.2 mg/dL (ref 0.0–1.0)
pH: 5.5 (ref 5.0–8.0)

## 2015-08-03 LAB — I-STAT CG4 LACTIC ACID, ED: Lactic Acid, Venous: 0.85 mmol/L (ref 0.5–2.0)

## 2015-08-03 LAB — I-STAT TROPONIN, ED: Troponin i, poc: 0.02 ng/mL (ref 0.00–0.08)

## 2015-08-03 LAB — LIPASE, BLOOD: LIPASE: 41 U/L (ref 22–51)

## 2015-08-03 LAB — TROPONIN I: Troponin I: 0.03 ng/mL (ref <0.031)

## 2015-08-03 MED ORDER — SIMVASTATIN 40 MG PO TABS
40.0000 mg | ORAL_TABLET | Freq: Every evening | ORAL | Status: DC
  Administered 2015-08-03: 40 mg via ORAL
  Filled 2015-08-03 (×2): qty 1

## 2015-08-03 MED ORDER — GI COCKTAIL ~~LOC~~
30.0000 mL | Freq: Four times a day (QID) | ORAL | Status: DC | PRN
  Administered 2015-08-04: 30 mL via ORAL
  Filled 2015-08-03 (×2): qty 30

## 2015-08-03 MED ORDER — ACETAMINOPHEN 325 MG PO TABS: 650.0000 mg | ORAL_TABLET | ORAL | Status: DC | PRN

## 2015-08-03 MED ORDER — AMLODIPINE BESYLATE 5 MG PO TABS
5.0000 mg | ORAL_TABLET | Freq: Every day | ORAL | Status: DC
  Administered 2015-08-03 – 2015-08-04 (×2): 5 mg via ORAL
  Filled 2015-08-03 (×2): qty 1

## 2015-08-03 MED ORDER — CLOPIDOGREL BISULFATE 75 MG PO TABS
75.0000 mg | ORAL_TABLET | Freq: Every day | ORAL | Status: DC
  Administered 2015-08-03 – 2015-08-04 (×2): 75 mg via ORAL
  Filled 2015-08-03 (×2): qty 1

## 2015-08-03 MED ORDER — LISINOPRIL 20 MG PO TABS
20.0000 mg | ORAL_TABLET | Freq: Every day | ORAL | Status: DC
  Administered 2015-08-03 – 2015-08-04 (×2): 20 mg via ORAL
  Filled 2015-08-03 (×2): qty 1

## 2015-08-03 MED ORDER — DM-GUAIFENESIN ER 30-600 MG PO TB12
1.0000 | ORAL_TABLET | Freq: Two times a day (BID) | ORAL | Status: DC
  Administered 2015-08-03 – 2015-08-04 (×2): 1 via ORAL
  Filled 2015-08-03 (×3): qty 1

## 2015-08-03 MED ORDER — NITROGLYCERIN 0.4 MG SL SUBL: 0.4000 mg | SUBLINGUAL_TABLET | SUBLINGUAL | Status: DC | PRN

## 2015-08-03 MED ORDER — CARVEDILOL 6.25 MG PO TABS
6.2500 mg | ORAL_TABLET | Freq: Two times a day (BID) | ORAL | Status: DC
  Administered 2015-08-03 – 2015-08-04 (×3): 6.25 mg via ORAL
  Filled 2015-08-03 (×4): qty 1

## 2015-08-03 MED ORDER — HEPARIN SODIUM (PORCINE) 5000 UNIT/ML IJ SOLN
5000.0000 [IU] | Freq: Three times a day (TID) | INTRAMUSCULAR | Status: DC
Start: 2015-08-03 — End: 2015-08-04
  Administered 2015-08-03 – 2015-08-04 (×3): 5000 [IU] via SUBCUTANEOUS
  Filled 2015-08-03 (×5): qty 1

## 2015-08-03 MED ORDER — ONDANSETRON HCL 4 MG/2ML IJ SOLN: 4.0000 mg | Freq: Four times a day (QID) | INTRAMUSCULAR | Status: DC | PRN

## 2015-08-03 MED ORDER — ASPIRIN 325 MG PO TABS
325.0000 mg | ORAL_TABLET | Freq: Every day | ORAL | Status: DC
Start: 2015-08-03 — End: 2015-08-04
  Administered 2015-08-03: 325 mg via ORAL
  Filled 2015-08-03 (×2): qty 1

## 2015-08-03 MED ORDER — PANTOPRAZOLE SODIUM 20 MG PO TBEC
20.0000 mg | DELAYED_RELEASE_TABLET | Freq: Every day | ORAL | Status: DC
  Administered 2015-08-03 – 2015-08-04 (×2): 20 mg via ORAL
  Filled 2015-08-03 (×3): qty 1

## 2015-08-03 MED ORDER — GI COCKTAIL ~~LOC~~
30.0000 mL | Freq: Once | ORAL | Status: AC
  Administered 2015-08-03: 30 mL via ORAL
  Filled 2015-08-03: qty 30

## 2015-08-03 NOTE — ED Notes (Signed)
Up all night with chest and abdominal discomfort. Abdomen feels "weird." Nausea, dizziness and SOB associated. 2 Prior MIs which concerned him. No left arm pain.  Currently being treated with Amoxicillin for bronchitis, has taken x 3 days.

## 2015-08-03 NOTE — H&P (Signed)
[facility] Admission History and Physical Service Pager: [phone]  Patient name: [name] [name] Medical record number: [id number] Date of birth: 01-10-1931 Age: 81 y.o. Gender: male  Primary Care Provider: [name], MD Consultants: Cardiology Code Status: FULL  Chief Complaint: Chest and abdominal pain  Assessment and Plan: [name] [name] is a 81 y.o. male presenting with chest and abdominal pain. PMH is significant for HTN, HLD, h/o MI, CAD s/p stent, and CKD3.   #Chest Pain: H/o MI x2 and CAD s/p stent x2. Chest pain does not appear anginal at this time but cannot r/o. Chest pain possible due to peritoneal irritation from Gi tract. None reproducible on exam r/o MSK. HEART score 6. Patient follows with cardiology (Dr. Rockey Situ) as outpatient and  per their note last stress test in 2014. Trop neg in ED. EKG unchanged from baseline no new ST changes.  -Admit to telemetry under Dr. Mingo Amber -cycle troponin -repeat EKG in AM -Chest x-ray ordered -cardiology consulted for possible repeat stress test -nitroglycerin and tylenol prn for chest pain -continue home medications for cardio protection including: BB, ACEi, ASA, Plavix  #Abdominal Pain: Most likely acute gastritis. Possible source could be chronic ASA use vs H. Pylori. CT abdomen performed in ED showing thickened gastric antrum/pykoris. Appears to be inflammatory process. Of note, aortic aneurysm was identified and needs monitoring. - monitor symptoms - gi cocktail for prn use - started PPI - consider testing for H. Pylori  #Dyspnea: Chronic SOB at baseline. Prior smoking history but stopped over 40 years ago. Mostly with exertion. Some component of obesity also playing role.  Last echo October 2012 shows ejection fraction 45%, apical hypokinesis, mild LVH, mildly dilated left atrium. Crackles and edema appreciated on physical exam. -obtain chest x-ray -consider repeating echo -cardiology  consulted -fluid restrict -may need dose of Lasix  #HTN: elevated on admission but patient has not taken his BP medications.  -continue home medications -monitor BPs  #HLD: At goal -continue statin  #CKD3 with thrombocytopenia: - avoid nephrotoxic agents - monitor labs  FEN/GI: HH diet/no IVF Prophylaxis: Subq Hep  Disposition: Admit to [facility] for observation.   History of Present Illness: [name] [name] is a 81 y.o. male presenting with chest pain and abdominal pain 1 day. Patient states that the last 2-3 weeks he has had congestion with chronic cough and clear sputum. He states starting yesterday he started having abdominal and chest pain. With his history of 2 MIs patient felt the need to come into be evaluated for his chest pain. He states that it does not feel like his other heart attacks. He denies any radiating symptoms to arm or neck. Chest pain is located on the left side. He describes it as nagging and not sharp. The chest pain is constant. He states that he took 3 aspirin and 2 nitros within the last day that did not help relieve his chest pain. He describes his abdominal pain as just feeling full of gas. Denies any constipation, n/v, or diarrhea. Denies any fevers, chills, or blood in stool. Of note, patient also endorses shortness of breath. Shortness of breath is a chronic issue that occurs on exertion.  Patient follows with Dr. Caryl Comes in [facility] in [place].   Review Of Systems: Per HPI. Otherwise 12 point review of systems was performed and was unremarkable.  Patient Active Problem List   Diagnosis Date Noted  . Shortness of breath 03/25/2015  . Chest pain 12/31/2012  .  Hyperlipidemia 10/07/2009  . HYPERTENSION, BENIGN 10/07/2009  . OLD MYOCARDIAL INFARCTION 10/07/2009  . CAD, NATIVE VESSEL 10/07/2009   Past Medical History: Past Medical History  Diagnosis Date  . Hypertension   . Hyperlipidemia   . Tobacco user     Remote  . Renal insufficiency,  mild   . Sinus bradycardia     Assymptomatic  . CAD (coronary artery disease) 10/21/2011    s/p stent placement   Past Surgical History: Past Surgical History  Procedure Laterality Date  . Transurethral resection of prostate    . Total hip arthroplasty      Right  . Cardiac catheterization  10/21/2011    s/p stent placement  . Cardiac catheterization  2008    stent placement  . Cardiac catheterization  2006    stent placement   Social History: History  Substance Use Topics  . Smoking status: Former Smoker -- 1.00 packs/day for 20 years    Types: Cigarettes    Quit date: 12/06/1968  . Smokeless tobacco: Not on file  . Alcohol Use: No   Additional social history: Wife and patient Please also refer to relevant sections of EMR.  Family History: Family History  Problem Relation Age of Onset  . Family history unknown: Yes   Allergies and Medications: No Known Allergies No current facility-administered medications on file prior to encounter.   Current Outpatient Prescriptions on File Prior to Encounter  Medication Sig Dispense Refill  . amLODipine (NORVASC) 5 MG tablet Take 5 mg by mouth daily.      . carvedilol (COREG) 6.25 MG tablet Take 1 tablet (6.25 mg total) by mouth 2 (two) times daily with a meal. 180 tablet 3  . clopidogrel (PLAVIX) 75 MG tablet Take 75 mg by mouth daily.      [name] [name] Kitchen lisinopril (PRINIVIL,ZESTRIL) 20 MG tablet Take 1 tablet (20 mg total) by mouth daily. 90 tablet 3  . simvastatin (ZOCOR) 40 MG tablet Take 1 tablet (40 mg total) by mouth every evening. 90 tablet 4    Objective: BP 166/76 mmHg  Pulse 65  Temp(Src) 98.6 F (37 C) (Oral)  Resp 19  SpO2 96% Exam: General: alert, obese, well-developed, NAD, cooperative HEENT: NCAT, no injection and anicteric. EOMI. MMM. No JVD. Lungs: normal respiratory effort, bibasilar crackles, no wheezing.  Heart: RRR,distant heart sounds. No murmur appreciated Abdomen: Bowel sounds decreased; abdomen soft and  nontender. No distention.  Pulses: Diminished pulses Extremities: 2+ pitting edema in BLE Neurologic: No focal deficits, +5 strength globally, sensation grossly intact, gait normal, A&Ox3.  Skin: Intact. Warm and dry. Seborrheic keratosis diffusely. Psych: Mood and affect are normal; no evidence of anxiety or depression.   Labs and Imaging: Results for orders placed or performed during the hospital encounter of 08/03/15 (from the past 24 hour(s))  Comprehensive metabolic panel     Status: Abnormal   Collection Time: 08/03/15  7:35 AM  Result Value Ref Range   Sodium 137 135 - 145 mmol/L   Potassium 5.1 3.5 - 5.1 mmol/L   Chloride 106 101 - 111 mmol/L   CO2 19 (L) 22 - 32 mmol/L   Glucose, Bld 137 (H) 65 - 99 mg/dL   BUN 33 (H) 6 - 20 mg/dL   Creatinine, Ser 1.82 (H) 0.61 - 1.24 mg/dL   Calcium 8.7 (L) 8.9 - 10.3 mg/dL   Total Protein 6.7 6.5 - 8.1 g/dL   Albumin 3.8 3.5 - 5.0 g/dL   AST 20 15 - 41 U/L  ALT 17 17 - 63 U/L   Alkaline Phosphatase 42 38 - 126 U/L   Total Bilirubin 0.8 0.3 - 1.2 mg/dL   GFR calc non Af Amer 33 (L) >60 mL/min   GFR calc Af Amer 38 (L) >60 mL/min   Anion gap 12 5 - 15  CBC with Differential     Status: Abnormal   Collection Time: 08/03/15  7:35 AM  Result Value Ref Range   WBC 7.5 4.0 - 10.5 K/uL   RBC 4.60 4.22 - 5.81 MIL/uL   Hemoglobin 15.6 13.0 - 17.0 g/dL   HCT 46.0 39.0 - 52.0 %   MCV 100.0 78.0 - 100.0 fL   MCH 33.9 26.0 - 34.0 pg   MCHC 33.9 30.0 - 36.0 g/dL   RDW 13.6 11.5 - 15.5 %   Platelets 124 (L) 150 - 400 K/uL   Neutrophils Relative % 68 43 - 77 %   Neutro Abs 5.1 1.7 - 7.7 K/uL   Lymphocytes Relative 20 12 - 46 %   Lymphs Abs 1.5 0.7 - 4.0 K/uL   Monocytes Relative 5 3 - 12 %   Monocytes Absolute 0.3 0.1 - 1.0 K/uL   Eosinophils Relative 7 (H) 0 - 5 %   Eosinophils Absolute 0.5 0.0 - 0.7 K/uL   Basophils Relative 0 0 - 1 %   Basophils Absolute 0.0 0.0 - 0.1 K/uL  Lipase, blood     Status: None   Collection Time: 08/03/15   7:35 AM  Result Value Ref Range   Lipase 41 22 - 51 U/L  I-stat troponin, ED     Status: None   Collection Time: 08/03/15  7:43 AM  Result Value Ref Range   Troponin i, poc 0.02 0.00 - 0.08 ng/mL   Comment 3          I-Stat CG4 Lactic Acid, ED     Status: None   Collection Time: 08/03/15  7:46 AM  Result Value Ref Range   Lactic Acid, Venous 0.85 0.5 - 2.0 mmol/L  Urinalysis, Routine w reflex microscopic (not at [facility])     Status: None   Collection Time: 08/03/15  9:25 AM  Result Value Ref Range   Color, Urine YELLOW YELLOW   APPearance CLEAR CLEAR   Specific Gravity, Urine 1.018 1.005 - 1.030   pH 5.5 5.0 - 8.0   Glucose, UA NEGATIVE NEGATIVE mg/dL   Hgb urine dipstick NEGATIVE NEGATIVE   Bilirubin Urine NEGATIVE NEGATIVE   Ketones, ur NEGATIVE NEGATIVE mg/dL   Protein, ur NEGATIVE NEGATIVE mg/dL   Urobilinogen, UA 0.2 0.0 - 1.0 mg/dL   Nitrite NEGATIVE NEGATIVE   Leukocytes, UA NEGATIVE NEGATIVE   Ct Abdomen Pelvis Wo Contrast  08/03/2015   IMPRESSION: Gastric antrum/ pyloric circumferentially thickened more so than typically seen based on the lack of distention and therefore underlying mass or inflammatory process is a possibility.  Small hiatal hernia.  Heart size top-normal minimally enlarged. Coronary artery calcifications.  Ectatic abdominal aorta at risk for aneurysm development. Recommend followup by ultrasound in 5 years. This recommendation follows ACR consensus guidelines: White Paper of the ACR Incidental Findings Committee II on Vascular Findings. J Am Coll Radiol 2013; 10:789-794.  Bilateral renal lesions some of which are cysts. Partially calcified/ hyperdense renal lesions are incompletely assessed on noncontrast imaging.  Hyperplasia of the adrenal glands with minimal nodularity.  Remote L1 anterior wedge compression fracture with 85% loss height anteriorly. Fusion T12-L1 vertebra. Mild retropulsion of the  posterior superior aspect of the L1 vertebral body with mild  spinal stenosis.   Electronically Signed   By: Genia Del M.D.   On: 08/03/2015 10:36   EKG- Text Interpretation: Normal sinus rhythm Left ventricular hypertrophy with QRS widening and repolarization abnormality Cannot rule out Septal infarct , age undetermined Abnormal ECG Confirmed by Hazle Coca [phone]) on 08/03/2015 7:21:00 AM  Katheren Shams, DO 08/03/2015, 10:52 AM PGY-2, [place] [facility] pager: [phone], text pages welcome

## 2015-08-03 NOTE — ED Provider Notes (Signed)
CSN: [id number]     Arrival date & time 08/03/15  0654 History   First MD Initiated Contact with Patient 08/03/15 [phone]     Chief Complaint  Patient presents with  . Chest Pain  . Abdominal Pain     Patient is a 81 y.o. male presenting with chest pain and abdominal pain. The history is provided by the patient. No language interpreter was used.  Chest Pain Associated symptoms: abdominal pain   Abdominal Pain Associated symptoms: chest pain    [name] [name] is for evaluation of of chest pain and abdominal pain. Yesterday afternoon he developed diffuse abdominal discomfort. He has difficulty characterizing the pain but states it is constant and nonradiating. He has associated left-sided chest pain that is intermittent in nature with no clear alleviating or worsening factors. He does have associated nausea, dizziness, shortness of breath. The shortness of breath has been an ongoing issue. He denies any fevers, vomiting, diarrhea, constipation, dysuria. Currently on amoxicillin for bronchitis. He has a history of MI and takes daily Plavix. Pt took 3, 325 mg Aspirin this morning and two nitroglycerin PTA.  No change in sxs with NTG.    Past Medical History  Diagnosis Date  . Hypertension   . Hyperlipidemia   . Tobacco user     Remote  . Renal insufficiency, mild   . Sinus bradycardia     Assymptomatic  . CAD (coronary artery disease) 10/21/2011    s/p stent placement   Past Surgical History  Procedure Laterality Date  . Transurethral resection of prostate    . Total hip arthroplasty      Right  . Cardiac catheterization  10/21/2011    s/p stent placement  . Cardiac catheterization  2008    stent placement  . Cardiac catheterization  2006    stent placement   Family History  Problem Relation Age of Onset  . Family history unknown: Yes   History  Substance Use Topics  . Smoking status: Former Smoker -- 1.00 packs/day for 20 years    Types: Cigarettes    Quit date: 12/06/1968  .  Smokeless tobacco: Not on file  . Alcohol Use: No    Review of Systems  Cardiovascular: Positive for chest pain.  Gastrointestinal: Positive for abdominal pain.  All other systems reviewed and are negative.     Allergies  Review of patient's allergies indicates no known allergies.  Home Medications   Prior to Admission medications   Medication Sig Start Date End Date Taking? Authorizing Provider  amLODipine (NORVASC) 5 MG tablet Take 5 mg by mouth daily.      Historical Provider, MD  aspirin 81 MG tablet Take 1 tablet (81 mg total) by mouth daily. 03/25/15   Minna Merritts, MD  carvedilol (COREG) 6.25 MG tablet Take 1 tablet (6.25 mg total) by mouth 2 (two) times daily with a meal. 01/30/14   Minna Merritts, MD  clopidogrel (PLAVIX) 75 MG tablet Take 75 mg by mouth daily.      Historical Provider, MD  lisinopril (PRINIVIL,ZESTRIL) 20 MG tablet Take 1 tablet (20 mg total) by mouth daily. 01/30/14   Minna Merritts, MD  simvastatin (ZOCOR) 40 MG tablet Take 1 tablet (40 mg total) by mouth every evening. 11/08/11   Minna Merritts, MD   There were no vitals taken for this visit. Physical Exam  Constitutional: He is oriented to person, place, and time. He appears well-developed and well-nourished.  HENT:  Head: Normocephalic  and atraumatic.  Cardiovascular: Normal rate and regular rhythm.   No murmur heard. Pulmonary/Chest: Effort normal and breath sounds normal. No respiratory distress.  Abdominal: Soft. There is no rebound and no guarding.  Mild diffuse abdominal tenderness  Musculoskeletal: He exhibits no tenderness.  1+ pitting edema bilateral lower extremities  Neurological: He is alert and oriented to person, place, and time.  Skin: Skin is warm and dry.  Psychiatric: He has a normal mood and affect. His behavior is normal.  Nursing note and vitals reviewed.   ED Course  Procedures (including critical care time) Labs Review Labs Reviewed  COMPREHENSIVE METABOLIC  PANEL - Abnormal; Notable for the following:    CO2 19 (*)    Glucose, Bld 137 (*)    BUN 33 (*)    Creatinine, Ser 1.82 (*)    Calcium 8.7 (*)    GFR calc non Af Amer 33 (*)    GFR calc Af Amer 38 (*)    All other components within normal limits  CBC WITH DIFFERENTIAL/PLATELET - Abnormal; Notable for the following:    Platelets 124 (*)    Eosinophils Relative 7 (*)    All other components within normal limits  LIPASE, BLOOD  URINALYSIS, ROUTINE W REFLEX MICROSCOPIC (NOT AT [facility])  I-STAT TROPOININ, ED  I-STAT CG4 LACTIC ACID, ED    Imaging Review Ct Abdomen Pelvis Wo Contrast  08/03/2015   CLINICAL DATA:  81 year old male complaining bilateral lower abdominal pain with nausea. Hypertension, hyperlipidemia, renal insufficiency. Post TURP. Initial encounter.  EXAM: CT ABDOMEN AND PELVIS WITHOUT CONTRAST  TECHNIQUE: Multidetector CT imaging of the abdomen and pelvis was performed following the standard protocol without IV contrast.  COMPARISON:  None.  FINDINGS: Although there is no extra luminal bowel inflammatory process, free fluid or free air, the gastric antrum/ pyloric region appears circumferentially thickened more so than typically seen based on the lack of distention and therefore underlying mass or inflammatory process is a possibility.  Small hiatal hernia.  Scarring lung bases.  Heart size top-normal minimally enlarged. Coronary artery calcifications.  Atherosclerotic type changes of the abdominal aorta with ectasia with maximal transverse dimension 2.7 cm (lower L3 level) where just above this level, the aorta measures 2.5 cm. Mild ectasia left common iliac artery.  Bilateral renal lesions some of which are cysts. Partially calcified/ hyperdense renal lesions are incompletely assessed on noncontrast imaging. No hydronephrosis, renal or ureteral obstructing stone.  Unenhanced imaging of the liver limited by streak artifact from gas within adjacent bowel. No worrisome abnormality  identified. Two tiny low-density structures too small to adequately characterize. No calcified gallstones. No CT evidence of gallbladder inflammation. If this were of concern, ultrasound may then be considered.  No obvious splenic or pancreatic lesion detected on this unenhanced exam.  Hyperplasia of the adrenal glands with minimal nodularity.  No adenopathy.  Streak artifact from bilateral hip replacements limits evaluation of the pelvis.  Remote L1 anterior wedge compression fracture with 85% loss height anteriorly. Fusion T12-L1 vertebra. Mild retropulsion of the posterior superior aspect of the L1 vertebral body with mild spinal stenosis. Facet joint degenerative changes most notable L5-S1.  IMPRESSION: Gastric antrum/ pyloric circumferentially thickened more so than typically seen based on the lack of distention and therefore underlying mass or inflammatory process is a possibility.  Small hiatal hernia.  Heart size top-normal minimally enlarged. Coronary artery calcifications.  Ectatic abdominal aorta at risk for aneurysm development. Recommend followup by ultrasound in 5 years. This recommendation follows  ACR consensus guidelines: White Paper of the ACR Incidental Findings Committee II on Vascular Findings. J Am Coll Radiol 2013; 10:789-794.  Bilateral renal lesions some of which are cysts. Partially calcified/ hyperdense renal lesions are incompletely assessed on noncontrast imaging.  Hyperplasia of the adrenal glands with minimal nodularity.  Remote L1 anterior wedge compression fracture with 85% loss height anteriorly. Fusion T12-L1 vertebra. Mild retropulsion of the posterior superior aspect of the L1 vertebral body with mild spinal stenosis.   Electronically Signed   By: Genia Del M.D.   On: 08/03/2015 10:36     EKG Interpretation   Date/Time:  Monday August 03 2015 07:05:54 EDT Ventricular Rate:  64 PR Interval:  194 QRS Duration: 140 QT Interval:  412 QTC Calculation: 425 R Axis:    -23 Text Interpretation:  Normal sinus rhythm Left ventricular hypertrophy  with QRS widening and repolarization abnormality Cannot rule out Septal  infarct , age undetermined Abnormal ECG Confirmed by Hazle Coca [phone]) on  08/03/2015 7:21:00 AM      MDM   Final diagnoses:  Chest pain, unspecified chest pain type    Patient here for evaluation of abdominal pain and chest pain, has extensive cardiac history. In terms of abdominal pain, suspect gastritis. Terms chest pain discussed with family medicine regarding admission for observation for stress testing.    [name] Reichert, MD 08/03/15 1536

## 2015-08-03 NOTE — Consult Note (Signed)
CARDIOLOGY CONSULT NOTE      Patient ID: [name] [name] MRN: [id number] DOB/AGE: April 13, 1931 81 y.o.  Admit date: 08/03/2015 Referring PhysicianJeffrey Babs Sciara, MD Primary [place] III, MD Primary Cardiologist: Dr. Rockey Situ Reason for Consultation: Chest Pain  HPI: [name] [name] is a 81 yo male with PMHx of coronary artery disease s/p stenting to LAD in 2006 with DES and restenting in 2008 with BMS after DES thrombosed secondary to non-compliance with Plavix, hypertension, hyperlipidemia, and chronic kidney disease who presented to the ED on 08/03/15 with complaint of chest pain and abdominal pain. His abdominal pain he describes as bloating and nausea, but denies vomiting, pain, diarrhea, hematochezia or melena. Patient states chest pain started yesterday and describes the pain as constant, dull, and nagging located on the left side of his chest without. Patient tried to take aspirin and nitroglycerin without relief. Nothing really makes the pain worse. At its worst, pain was a 5/10, but improved to a 1-2/10 after the GI cocktail. He reports compliance with his Plavix and other medications. He has had a URI for the past 1-2 weeks with productive cough of clear sputum which he currently taking amoxicillin for. In the ED, EKG showed wide QRS with sinus rhythm similar to prior EKGs. Troponin negative. Cardiology was consulted for further work up of chest pain.   Prior stress test 01/10/2013 that showed old scar and the mid to distal anterior wall, apical region.   Prior cardiac catheterization October 2012 showed left main shows no disease, LAD has 30% proximal disease, 2 overlapping stents in the mid region, 95% ostial diagonal disease jailed by the stents, 95% proximal OM 2 disease, mild plaque in the RCA, hypokinesis of the anterolateral wall, apical region and inferoapical wall with ejection fraction 30-35%.  Echocardiogram October 20, 2011 showed ejection fraction 45%, apical  hypokinesis, mild LVH, mildly dilated left atrium.  Review of systems complete and found to be negative unless listed above   Past Medical History  Diagnosis Date  . Hypertension   . Hyperlipidemia   . Tobacco user     Remote  . Renal insufficiency, mild   . Sinus bradycardia     Assymptomatic  . CAD (coronary artery disease) 10/21/2011    s/p stent placement    Family History  Problem Relation Age of Onset  . Family history unknown: Yes    History   Social History  . Marital Status: Married    Spouse Name: N/A  . Number of Children: N/A  . Years of Education: N/A   Occupational History  . Retired [employer]   Social History Main Topics  . Smoking status: Former Smoker -- 1.00 packs/day for 20 years    Types: Cigarettes    Quit date: 12/06/1968  . Smokeless tobacco: Not on file  . Alcohol Use: No  . Drug Use: No  . Sexual Activity: Not on file   Other Topics Concern  . Not on file   Social History Narrative   Married   Lives in [place] with his wife    Past Surgical History  Procedure Laterality Date  . Transurethral resection of prostate    . Total hip arthroplasty      Right  . Cardiac catheterization  10/21/2011    s/p stent placement  . Cardiac catheterization  2008    stent placement  . Cardiac catheterization  2006    stent placement    Physical Exam: Filed Vitals:   08/03/15  1230 08/03/15 1300 08/03/15 1330 08/03/15 1400  BP: 155/74 139/61 150/61 142/67  Pulse: 66 68 66 63  Temp:      TempSrc:      Resp: '22 18 22 22  '$ SpO2: 94% 94% 94% 94%   General: Vital signs reviewed.  Patient is well-developed and well-nourished, in no acute distress and cooperative with exam.  Cardiovascular: RRR, S1 normal, S2 normal. Pulmonary/Chest: Clear to auscultation bilaterally, no wheezes, rales, or rhonchi. Abdominal: Soft, non-tender, mildly distended, BS +, no guarding present.  Extremities: Trace pitting edema in lower extremities bilaterally, pulses  symmetric and intact bilaterally.  Neurological: A&O x3 Skin: Warm, dry and intact. No rashes or erythema. Psychiatric: Normal mood and affect. speech and behavior is normal. Cognition and memory are normal.   Labs:   Lab Results  Component Value Date   WBC 7.5 08/03/2015   HGB 15.6 08/03/2015   HCT 46.0 08/03/2015   MCV 100.0 08/03/2015   PLT 124* 08/03/2015     Recent Labs Lab 08/03/15 0735  NA 137  K 5.1  CL 106  CO2 19*  BUN 33*  CREATININE 1.82*  CALCIUM 8.7*  PROT 6.7  BILITOT 0.8  ALKPHOS 42  ALT 17  AST 20  GLUCOSE 137*   Lab Results  Component Value Date   CKTOTAL 94 10/21/2011   CKMB 2.8 10/21/2011   TROPONINI <0.30 10/21/2011    Lab Results  Component Value Date   CHOL 156 10/21/2011   CHOL 120 10/14/2009   CHOL 98 03/23/2009   Lab Results  Component Value Date   HDL 27* 10/21/2011   HDL 30* 10/14/2009   HDL 29* 03/23/2009   Lab Results  Component Value Date   LDLCALC 95 10/21/2011   LDLCALC 54 10/14/2009   LDLCALC 43 03/23/2009   Lab Results  Component Value Date   TRIG 172* 10/21/2011   TRIG 180* 10/14/2009   TRIG 131 03/23/2009   Lab Results  Component Value Date   CHOLHDL 5.8 10/21/2011   CHOLHDL 4.0 Ratio 10/14/2009   CHOLHDL 3.4 Ratio 03/23/2009   EKG: sinus bradycardia with wide QRS similar to prior  ASSESSMENT AND PLAN:  Active Problems:   Chest pain  Atypical Chest Pain: Patient presents with a one day history of left sided chest pain without radiation. He describes the pain as constant, dull, 5/10 at its worst with improvement to 1-2/10 after GI cocktail. Nothing seems to make the pain worse. EKG shows LBBB and appears similar to prior. Troponin negative. He does have significant cardiac history with previous stent placement; however, chest pain sounds more GI in nature versus referred pain from pulmonary pathology. He has chronic DOE at baseline which he states has not worsened in the last 5 months. He denies chest pain  with exertion. We agree with admission to rule out acute coronary syndrome. If patient rules out with negative troponin, we recommend outpatient stress test with Dr. Rockey Situ.  -Telemetry -Trend troponins -Echocardiogram -CXR -Repeat EKG tomorrow am -Continue Plavix 75 mg daily -Continue Coreg 6.25 mg BID and lisinopril 20 mg daily -Continue ASA 81 mg daily as indicated by previous Cardiology note 02/2015; however, it is not clear why patient is still on both Plavix and ASA  HTN: 169/73 on admission with improvement to 138/59. Previously well controlled on amlodipine 5 mg daily, coreg 6.25 mg BID and lisinopril 20 mg daily. -Continue current medications.   HLD: Last lipid panel showed cholesterol 156, TG 172, HDL 27, and  LDL 95. Patient is on simvastatin 40 mg daily. -Continue simvastatin 40 mg daily  Signed: Osa Craver, DO PGY-2 Internal Medicine Resident Pager # [phone] 08/03/2015 2:27 PM   I have examined the patient and reviewed assessment and plan and discussed with patient.  Agree with above as stated.  Check echo given previously decreased EF from anterior MI in 2006/2008.Marland Kitchen Atypical CP- improved after GI cocktail.  R/o for MI given cardiac history.  Different from prior angina. If he ruiles out, would likely discharge with f/u with Dr. Rockey Situ.  If he rules in, plan on cath.  Patriece Archbold S.

## 2015-08-04 ENCOUNTER — Observation Stay (HOSPITAL_BASED_OUTPATIENT_CLINIC_OR_DEPARTMENT_OTHER): Payer: PPO

## 2015-08-04 ENCOUNTER — Other Ambulatory Visit: Payer: Self-pay | Admitting: Physician Assistant

## 2015-08-04 DIAGNOSIS — R079 Chest pain, unspecified: Secondary | ICD-10-CM

## 2015-08-04 DIAGNOSIS — I25119 Atherosclerotic heart disease of native coronary artery with unspecified angina pectoris: Secondary | ICD-10-CM | POA: Diagnosis not present

## 2015-08-04 DIAGNOSIS — R1032 Left lower quadrant pain: Secondary | ICD-10-CM | POA: Diagnosis not present

## 2015-08-04 DIAGNOSIS — E785 Hyperlipidemia, unspecified: Secondary | ICD-10-CM

## 2015-08-04 DIAGNOSIS — I10 Essential (primary) hypertension: Secondary | ICD-10-CM | POA: Diagnosis not present

## 2015-08-04 DIAGNOSIS — R1031 Right lower quadrant pain: Secondary | ICD-10-CM | POA: Diagnosis not present

## 2015-08-04 DIAGNOSIS — R0602 Shortness of breath: Secondary | ICD-10-CM | POA: Diagnosis not present

## 2015-08-04 DIAGNOSIS — R0789 Other chest pain: Secondary | ICD-10-CM | POA: Diagnosis not present

## 2015-08-04 LAB — TROPONIN I
Troponin I: 0.03 ng/mL (ref <0.031)
Troponin I: <0.03 ng/mL (ref <0.031)

## 2015-08-04 MED ORDER — SENNA 8.6 MG PO TABS
1.0000 | ORAL_TABLET | Freq: Every day | ORAL | Status: DC
  Administered 2015-08-04: 8.6 mg via ORAL
  Filled 2015-08-04: qty 1

## 2015-08-04 MED ORDER — POLYETHYLENE GLYCOL 3350 17 G PO PACK
17.0000 g | PACK | Freq: Every day | ORAL | Status: DC
  Administered 2015-08-04: 17 g via ORAL
  Filled 2015-08-04: qty 1

## 2015-08-04 MED ORDER — ASPIRIN EC 81 MG PO TBEC
81.0000 mg | DELAYED_RELEASE_TABLET | Freq: Every day | ORAL | Status: DC
  Administered 2015-08-04: 81 mg via ORAL
  Filled 2015-08-04: qty 1

## 2015-08-04 MED ORDER — ROSUVASTATIN CALCIUM 20 MG PO TABS
20.0000 mg | ORAL_TABLET | Freq: Every day | ORAL | Status: DC
  Administered 2015-08-04: 20 mg via ORAL
  Filled 2015-08-04: qty 1

## 2015-08-04 MED ORDER — ASPIRIN 81 MG PO TBEC: 81.0000 mg | DELAYED_RELEASE_TABLET | Freq: Every day | ORAL | Status: DC

## 2015-08-04 MED ORDER — ROSUVASTATIN CALCIUM 20 MG PO TABS: 20.0000 mg | ORAL_TABLET | Freq: Every day | ORAL | Status: DC

## 2015-08-04 MED ORDER — PANTOPRAZOLE SODIUM 20 MG PO TBEC: 20.0000 mg | DELAYED_RELEASE_TABLET | Freq: Every day | ORAL | Status: DC

## 2015-08-04 NOTE — Progress Notes (Signed)
Patient Name: [name] [name] Date of Encounter: 08/04/2015  Primary Cardiologist: Dr. Rockey Situ   Principal Problem:   Pain in the chest Active Problems:   Hyperlipidemia   HYPERTENSION, BENIGN   CAD, NATIVE VESSEL    SUBJECTIVE  Denies any CP or SOB. CP resolved, states he think the CP came from his abdomen, it feels different from previous MI. He also complain some cough and nasal drainage.   CURRENT MEDS . amLODipine  5 mg Oral Daily  . aspirin  325 mg Oral Daily  . carvedilol  6.25 mg Oral BID WC  . clopidogrel  75 mg Oral Daily  . dextromethorphan-guaiFENesin  1 tablet Oral BID  . heparin  5,000 Units Subcutaneous 3 times per day  . lisinopril  20 mg Oral Daily  . pantoprazole  20 mg Oral Daily  . simvastatin  40 mg Oral QPM    OBJECTIVE  Filed Vitals:   08/03/15 2118 08/03/15 2355 08/04/15 0525 08/04/15 0823  BP: 143/57 139/56 121/63 132/63  Pulse: 59 63 56 61  Temp: 98.5 F (36.9 C) 98 F (36.7 C) 98.1 F (36.7 C) 99.1 F (37.3 C)  TempSrc: Oral Oral Oral Oral  Resp: '18 18 18 16  '$ Height:      Weight:      SpO2: 94% 94% 95% 94%    Intake/Output Summary (Last 24 hours) at 08/04/15 0846 Last data filed at 08/04/15 0224  Gross per 24 hour  Intake    960 ml  Output    300 ml  Net    660 ml   Filed Weights   08/03/15 1645  Weight: 210 lb 6.4 oz (95.437 kg)    PHYSICAL EXAM  General: Pleasant, NAD. Neuro: Alert and oriented X 3. Moves all extremities spontaneously. Psych: Normal affect. HEENT:  Normal  Neck: Supple without bruits or JVD. Lungs:  Resp regular and unlabored, CTA. Heart: RRR no s3, s4, or murmurs. Abdomen: Soft, non-tender, non-distended, BS + x 4.  Extremities: No clubbing, cyanosis or edema. DP/PT/Radials 2+ and equal bilaterally.  Accessory Clinical Findings  CBC  Recent Labs  08/03/15 0735 08/03/15 1659  WBC 7.5 8.0  NEUTROABS 5.1  --   HGB 15.6 15.6  HCT 46.0 45.8  MCV 100.0 98.5  PLT 124* 774*   Basic Metabolic  Panel  Recent Labs  08/03/15 0735 08/03/15 1659  NA 137  --   K 5.1  --   CL 106  --   CO2 19*  --   GLUCOSE 137*  --   BUN 33*  --   CREATININE 1.82* 1.75*  CALCIUM 8.7*  --    Liver Function Tests  Recent Labs  08/03/15 0735  AST 20  ALT 17  ALKPHOS 42  BILITOT 0.8  PROT 6.7  ALBUMIN 3.8    Recent Labs  08/03/15 0735  LIPASE 41   Cardiac Enzymes  Recent Labs  08/03/15 1659 08/03/15 2324 08/04/15 0548  TROPONINI 0.03 0.03 <0.03    TELE NSR with HR 60s    ECG  LBBB  Echocardiogram 04/16/2009  1. Left ventricle: Wall thickness was increased in a pattern of mild  LVH. Systolic function was moderately reduced. The estimated  ejection fraction was in the range of 35% to 40%. Akinesis of the  anteroseptal, anterior, and apical myocardium. 2. Atrial septum: No defect or patent foramen ovale was identified.    Radiology/Studies  Ct Abdomen Pelvis Wo Contrast  08/03/2015   CLINICAL DATA:  81 year old male complaining bilateral lower abdominal pain with nausea. Hypertension, hyperlipidemia, renal insufficiency. Post TURP. Initial encounter.  EXAM: CT ABDOMEN AND PELVIS WITHOUT CONTRAST  TECHNIQUE: Multidetector CT imaging of the abdomen and pelvis was performed following the standard protocol without IV contrast.  COMPARISON:  None.  FINDINGS: Although there is no extra luminal bowel inflammatory process, free fluid or free air, the gastric antrum/ pyloric region appears circumferentially thickened more so than typically seen based on the lack of distention and therefore underlying mass or inflammatory process is a possibility.  Small hiatal hernia.  Scarring lung bases.  Heart size top-normal minimally enlarged. Coronary artery calcifications.  Atherosclerotic type changes of the abdominal aorta with ectasia with maximal transverse dimension 2.7 cm (lower L3 level) where just above this level, the aorta measures 2.5 cm. Mild ectasia left common iliac  artery.  Bilateral renal lesions some of which are cysts. Partially calcified/ hyperdense renal lesions are incompletely assessed on noncontrast imaging. No hydronephrosis, renal or ureteral obstructing stone.  Unenhanced imaging of the liver limited by streak artifact from gas within adjacent bowel. No worrisome abnormality identified. Two tiny low-density structures too small to adequately characterize. No calcified gallstones. No CT evidence of gallbladder inflammation. If this were of concern, ultrasound may then be considered.  No obvious splenic or pancreatic lesion detected on this unenhanced exam.  Hyperplasia of the adrenal glands with minimal nodularity.  No adenopathy.  Streak artifact from bilateral hip replacements limits evaluation of the pelvis.  Remote L1 anterior wedge compression fracture with 85% loss height anteriorly. Fusion T12-L1 vertebra. Mild retropulsion of the posterior superior aspect of the L1 vertebral body with mild spinal stenosis. Facet joint degenerative changes most notable L5-S1.  IMPRESSION: Gastric antrum/ pyloric circumferentially thickened more so than typically seen based on the lack of distention and therefore underlying mass or inflammatory process is a possibility.  Small hiatal hernia.  Heart size top-normal minimally enlarged. Coronary artery calcifications.  Ectatic abdominal aorta at risk for aneurysm development. Recommend followup by ultrasound in 5 years. This recommendation follows ACR consensus guidelines: White Paper of the ACR Incidental Findings Committee II on Vascular Findings. J Am Coll Radiol 2013; 10:789-794.  Bilateral renal lesions some of which are cysts. Partially calcified/ hyperdense renal lesions are incompletely assessed on noncontrast imaging.  Hyperplasia of the adrenal glands with minimal nodularity.  Remote L1 anterior wedge compression fracture with 85% loss height anteriorly. Fusion T12-L1 vertebra. Mild retropulsion of the posterior superior  aspect of the L1 vertebral body with mild spinal stenosis.   Electronically Signed   By: Genia Del M.D.   On: 08/03/2015 10:36   Dg Chest 2 View  08/03/2015   CLINICAL DATA:  81 year old male with dyspnea  EXAM: CHEST  2 VIEW  COMPARISON:  Prior chest x-ray 10/20/2011  FINDINGS: Stable cardiac and mediastinal contours. Atherosclerotic calcifications again noted in the transverse aorta. Slightly increased diffuse interstitial prominence compared to prior imaging. Central airway thickening and peribronchial cuffing. Multilevel degenerative endplate spurring. Stable upper lumbar compression fracture. No new compression fracture. No acute osseous abnormality.  IMPRESSION: 1. Increased diffuse interstitial prominence compared to 10/20/2011. Differential considerations include progression of a pulmonary parenchymal disease such as developing fibrotic change, atypical respiratory or viral infection, and mild interstitial edema. 2. Central airway thickening and peribronchial cuffing are similar compared to prior consistent with chronic bronchitis.   Electronically Signed   By: Jacqulynn Cadet M.D.   On: 08/03/2015 18:39    ASSESSMENT AND PLAN  81 yo male with h/o CAD s/p stent to LAD in 2006 with DES and restenting in 2008 with BMS after DES thrombosed secondary to non-compliance with Plavix, hypertension, hyperlipidemia, and chronic kidney disease who presented to the ED on 08/03/15 with complaint of chest pain and abdominal pain. Last cath 09/2011   1. Atypical chest pain and abdominal pain improved after GI cocktail  - unless definitive sign for ACS, not ideal candidate for invasive eval given renal insufficiency  - serial trop negative. EKG shows old LBBB  - CTA of abdomen: Ectatic abdominal aorta at risk for aneurysm development. Recommend followup by ultrasound in 5 years. Gastric antrum/ pyloric circumferentially thickened more so than typically seen based on the lack of distention and  therefore underlying mass or inflammatory process is a possibility. Small hiatal hernia.  - symptom resolved, likely discharge today if echo is normal without wall motion abnormality. Consider outpatient myoview. Note previous echo in 2010 shows EF 35-40%.    2. CAD s/p multiple PCI  3. CKD stage III: Cr 1.7 4. HTN 5. HLD  Signed, Woodward Ku Pager: [phone]   Patient seen and examined. Agree with assessment and plan. ECHO findings unchanged from 2010, occluded mid LAD stent in 2008 accounting for wall motion abnormality. Reviewed with patient.  OK to dc today and OV f/u with possible myoview   Troy Sine, MD, [facility] 08/04/2015 4:07 PM

## 2015-08-04 NOTE — Progress Notes (Signed)
Family Medicine Teaching Service Daily Progress Note Intern Pager: [phone]  Patient name: [name] [name] Medical record number: [id number] Date of birth: 1931/11/21 Age: 81 y.o. Gender: male  Primary Care Provider: [name], MD Consultants: Cardiology Code Status: FULL   Assessment and Plan: [name] [name] is a 81 y.o. male presenting with chest and abdominal pain. PMH is significant for HTN, HLD, h/o MI, CAD s/p stent, and CKD3.   #Chest Pain: Chest pain possible due to peritoneal irritation from GI tract. HEART score 6. Patient follows with cardiology (Dr. Rockey Situ) as outpatient and per their note last stress test in 2014.Repeat EKG shows sinus bradycardia and LAD unchanged from baseline with some T wave abnormalities Troponins negative x3.  - Per cardiology, continue home meds for cardio protection and obtain echo - Echo pending  - Nitroglycerin and tylenol prn for chest pain  #Abdominal Pain: Most likely acute gastritis. Possible source could be chronic ASA use vs H. Pylori. CT abdomen performed in ED showing thickened gastric antrum/pykoris. Appears to be inflammatory process. Of note, aortic aneurysm was identified and needs monitoring. - Monitor symptoms - GI cocktail for prn use - Cont PPI - Per cardiology, pt can be discharged today if echo shows no wall abnormalities  - Given CT abd findings, outpatient GI follow-up   #Dyspnea: Chronic SOB at baseline. Prior smoking history but stopped over 40 years ago. Mostly with exertion. Some component of obesity also playing role. Last echo October 2012 shows ejection fraction 45%, apical hypokinesis, mild LVH, mildly dilated left atrium.  CXR - central airway thickening and peribronchial cuffing consistent with chronic bronchitis. - Echo pending - Fluid restrict - Consider Lasix if no improvement   #HTN: improved since admission, presumably after patient received his home BP meds - Continue home medications - Monitor  BPs  #HLD: At goal - Continue statin  #CKD3 with thrombocytopenia: Cr. 1.75 on admission (baseline ~1.7) - Avoid nephrotoxic agents - Monitor  FEN/GI: HH diet/no IVF Prophylaxis: subQ heparin  Disposition: home pending medical improvement   Subjective:  Patient reports that his chest pain has resolved, but now he has some abdominal pain. He says it feels like food is "backing up" when he eats, and leads to the pain he experienced yesterday. Otherwise he has no complaints.   Objective: Temp:  [97 F (36.1 C)-99.1 F (37.3 C)] 97 F (36.1 C) (08/09 1415) Pulse Rate:  [56-70] 58 (08/09 1415) Resp:  [16-24] 17 (08/09 1415) BP: (112-154)/(56-94) 152/65 mmHg (08/09 1415) SpO2:  [93 %-97 %] 97 % (08/09 1415) Weight:  [210 lb 6.4 oz (95.437 kg)] 210 lb 6.4 oz (95.437 kg) (08/08 1645) Physical Exam: General: elderly gentleman lying in bed in NAD Cardiovascular: RRR, no murmurs appreciated Respiratory: CTAB, no wheezes Abdomen: distended, non-tender, +BS Neuro: A&Ox3, no focal deficits Psych: appropriate mood and affect   Laboratory:  Recent Labs Lab 08/03/15 0735 08/03/15 1659  WBC 7.5 8.0  HGB 15.6 15.6  HCT 46.0 45.8  PLT 124* 140*    Recent Labs Lab 08/03/15 0735 08/03/15 1659  NA 137  --   K 5.1  --   CL 106  --   CO2 19*  --   BUN 33*  --   CREATININE 1.82* 1.75*  CALCIUM 8.7*  --   PROT 6.7  --   BILITOT 0.8  --   ALKPHOS 42  --   ALT 17  --   AST 20  --   GLUCOSE  137*  --     Imaging/Diagnostic Tests: Ct Abdomen Pelvis Wo Contrast  08/03/2015   CLINICAL DATA:  81 year old male complaining bilateral lower abdominal pain with nausea. Hypertension, hyperlipidemia, renal insufficiency. Post TURP. Initial encounter.  EXAM: CT ABDOMEN AND PELVIS WITHOUT CONTRAST  TECHNIQUE: Multidetector CT imaging of the abdomen and pelvis was performed following the standard protocol without IV contrast.  COMPARISON:  None.  FINDINGS: Although there is no extra luminal  bowel inflammatory process, free fluid or free air, the gastric antrum/ pyloric region appears circumferentially thickened more so than typically seen based on the lack of distention and therefore underlying mass or inflammatory process is a possibility.  Small hiatal hernia.  Scarring lung bases.  Heart size top-normal minimally enlarged. Coronary artery calcifications.  Atherosclerotic type changes of the abdominal aorta with ectasia with maximal transverse dimension 2.7 cm (lower L3 level) where just above this level, the aorta measures 2.5 cm. Mild ectasia left common iliac artery.  Bilateral renal lesions some of which are cysts. Partially calcified/ hyperdense renal lesions are incompletely assessed on noncontrast imaging. No hydronephrosis, renal or ureteral obstructing stone.  Unenhanced imaging of the liver limited by streak artifact from gas within adjacent bowel. No worrisome abnormality identified. Two tiny low-density structures too small to adequately characterize. No calcified gallstones. No CT evidence of gallbladder inflammation. If this were of concern, ultrasound may then be considered.  No obvious splenic or pancreatic lesion detected on this unenhanced exam.  Hyperplasia of the adrenal glands with minimal nodularity.  No adenopathy.  Streak artifact from bilateral hip replacements limits evaluation of the pelvis.  Remote L1 anterior wedge compression fracture with 85% loss height anteriorly. Fusion T12-L1 vertebra. Mild retropulsion of the posterior superior aspect of the L1 vertebral body with mild spinal stenosis. Facet joint degenerative changes most notable L5-S1.  IMPRESSION: Gastric antrum/ pyloric circumferentially thickened more so than typically seen based on the lack of distention and therefore underlying mass or inflammatory process is a possibility.  Small hiatal hernia.  Heart size top-normal minimally enlarged. Coronary artery calcifications.  Ectatic abdominal aorta at risk for  aneurysm development. Recommend followup by ultrasound in 5 years. This recommendation follows ACR consensus guidelines: White Paper of the ACR Incidental Findings Committee II on Vascular Findings. J Am Coll Radiol 2013; 10:789-794.  Bilateral renal lesions some of which are cysts. Partially calcified/ hyperdense renal lesions are incompletely assessed on noncontrast imaging.  Hyperplasia of the adrenal glands with minimal nodularity.  Remote L1 anterior wedge compression fracture with 85% loss height anteriorly. Fusion T12-L1 vertebra. Mild retropulsion of the posterior superior aspect of the L1 vertebral body with mild spinal stenosis.   Electronically Signed   By: Genia Del M.D.   On: 08/03/2015 10:36   Dg Chest 2 View  08/03/2015   CLINICAL DATA:  81 year old male with dyspnea  EXAM: CHEST  2 VIEW  COMPARISON:  Prior chest x-ray 10/20/2011  FINDINGS: Stable cardiac and mediastinal contours. Atherosclerotic calcifications again noted in the transverse aorta. Slightly increased diffuse interstitial prominence compared to prior imaging. Central airway thickening and peribronchial cuffing. Multilevel degenerative endplate spurring. Stable upper lumbar compression fracture. No new compression fracture. No acute osseous abnormality.  IMPRESSION: 1. Increased diffuse interstitial prominence compared to 10/20/2011. Differential considerations include progression of a pulmonary parenchymal disease such as developing fibrotic change, atypical respiratory or viral infection, and mild interstitial edema. 2. Central airway thickening and peribronchial cuffing are similar compared to prior consistent with chronic bronchitis.  Electronically Signed   By: Jacqulynn Cadet M.D.   On: 08/03/2015 18:39     [place], MD 08/04/2015, 2:25 PM PGY-1, [place] Intern pager: [phone], text pages welcome

## 2015-08-04 NOTE — Discharge Summary (Signed)
[name] [name]  Patient name: [name] [name] Medical record number: [id number] Date of birth: 1931/11/28 Age: 81 y.o. Gender: male Date of Admission: 08/03/2015  Date of Discharge: 08/04/15 Admitting Physician: [name] [name]  Primary Care Provider: [name], [name] Consultants: Cardiology  Indication for Hospitalization: chest and abdominal pain  Discharge Diagnoses/Problem List:  Patient Active Problem List   Diagnosis Date Noted  . Pain in the chest   . Shortness of breath 03/25/2015  . Hyperlipidemia 10/07/2009  . HYPERTENSION, BENIGN 10/07/2009  . OLD MYOCARDIAL INFARCTION 10/07/2009  . CAD, NATIVE VESSEL 10/07/2009     Disposition: home  Discharge Condition: stable  Discharge Exam:  General: elderly gentleman lying in bed in NAD Cardiovascular: RRR, no murmurs appreciated Respiratory: CTAB, no wheezes Abdomen: distended, non-tender, +BS Neuro: A&Ox3, no focal deficits Psych: appropriate mood and affect   Brief Hospital Course:  [name] [name] presented with chest and abdominal pain x1 day not relieved by ASA or nitro. Initial EKG in ED showed no new changes, and troponins were negative x3. CXR showed findings consistent with already known chronic bronchitis, but no new cardiac abnormalities. Given abdominal pain, patient was given GI cocktail, and his pain resolved.   Given patient's cardiac history, cardiology was consulted. They recommended repeat echo to rule out any new wall abnormalities as potential cause of pain, since the rest of the cardiac work-up was benign. Echo showed some wall abnormalities and grade 1 diastolic dysfunction, but this was unchanged from prior echo. Repeat EKG also showed no new abnormalities.   Since patient was asymptomatic and there were no concerning labs or imaging, the patient was discharged with planned cardiology and PCP follow-up.   Of note, CT abd/pelvis showed ectatic aorta with  risk for aneurysm development. This did not seem to play a part in his symptoms for this hospitalization however.   Issues for Follow Up:  1. Ectatic abdominal aorta seen on CT abdomen. Given risk for aneurysm development, recommend [place] follow-up in 5 years or earlier if patient becomes symptomatic.  2. Given presumed GI etiology of pain as well as thickened antrum and pylorus seen on CT, recommend GI follow-up for further work-up. Have started PPI in meantime.  3. Given risk factors, we discontinued simvastatin and changed patient to high-intensity rosuvastatin. Recommend lipid panel at follow-up.   Significant Procedures: none  Significant Labs and Imaging:  EKG - sinus brady with LAD unchanged from baseline  Echo - EF 45-50%; akinesis of distal septal and apical myocardium, hypokinesis of mid-anteroseptal and apical anterior myocardium, grade 1 diastolic dysfunction CT Abd Pelvis - ectatic abdominal aorta at risk for aneurysm development; gastric antrum/pyloric circumferentially thickened suggesting potential underlying mass or inflammatory process; small hiatal hernia; bilateral renal cysts; hyperplasia of adrenal glands CXR - central airway thickening and peribronchial cuffing similar to prior scans consistent with chronic bronchitis   Recent Labs Lab 08/03/15 0735 08/03/15 1659  WBC 7.5 8.0  HGB 15.6 15.6  HCT 46.0 45.8  PLT 124* 140*    Recent Labs Lab 08/03/15 0735 08/03/15 1659  NA 137  --   K 5.1  --   CL 106  --   CO2 19*  --   GLUCOSE 137*  --   BUN 33*  --   CREATININE 1.82* 1.75*  CALCIUM 8.7*  --   ALKPHOS 42  --   AST 20  --   ALT 17  --   ALBUMIN 3.8  --  Results/Tests Pending at Time of Discharge: None  Discharge Medications:    Medication List    STOP taking these medications        aspirin 325 MG tablet  Replaced by:  aspirin 81 MG EC tablet     ZOCOR 40 MG tablet  Generic drug:  simvastatin      TAKE these medications         amLODipine 5 MG tablet  Commonly known as:  NORVASC  Take 5 mg by mouth daily.     amoxicillin 125 MG chewable tablet  Commonly known as:  AMOXIL  Chew 125 mg by mouth 2 (two) times daily.     aspirin 81 MG EC tablet  Take 1 tablet (81 mg total) by mouth daily.     carvedilol 6.25 MG tablet  Commonly known as:  COREG  Take 1 tablet (6.25 mg total) by mouth 2 (two) times daily with a meal.     clopidogrel 75 MG tablet  Commonly known as:  PLAVIX  Take 75 mg by mouth daily.     lisinopril 20 MG tablet  Commonly known as:  PRINIVIL,ZESTRIL  Take 1 tablet (20 mg total) by mouth daily.     nitroGLYCERIN 0.4 MG SL tablet  Commonly known as:  NITROSTAT  Place 0.4 mg under the tongue every 5 (five) minutes as needed for chest pain.     pantoprazole 20 MG tablet  Commonly known as:  PROTONIX  Take 1 tablet (20 mg total) by mouth daily.     rosuvastatin 20 MG tablet  Commonly known as:  CRESTOR  Take 1 tablet (20 mg total) by mouth daily at 6 PM.        Discharge Instructions: Please refer to Patient Instructions section of EMR for full details.  Patient was counseled important signs and symptoms that should prompt return to medical care, changes in medications, dietary instructions, activity restrictions, and follow up appointments.   Follow-Up Appointments: Follow-up Information    Follow up with [name] Briscoe Burns III, [name] In 1 week.   Specialty:  Internal Medicine   Why:  For hospital follow-up   Contact information:   [place] [address] [phone]       Follow up with [name] Rogue, [name] On 09/21/2015.   Specialty:  Cardiology   Why:  8:40am.   Contact information:   [place] [address] [phone]       Follow up with [facility] On 08/11/2015.   Why:  Arrive at medical mall entrance, register at 9:30am. Stress test will start at [place]. Please no food or drink after midnight on the day of test,  especially no caffeine. Sips of water with meds ok.    Contact information:   [place] [phone]       [name] Mould, [name] 08/04/2015, 4:55 PM PGY-1, [place]

## 2015-08-04 NOTE — Discharge Instructions (Signed)
You were hospitalized for chest pain. Please continue to take all of your home medications as prescribed before your hospitalization.  It is important to follow-up with your primary doctor and your cardiologist within one week.

## 2015-08-04 NOTE — Progress Notes (Signed)
  Echocardiogram 2D Echocardiogram has been performed.  Darlina Sicilian M 08/04/2015, 11:09 AM

## 2015-08-04 NOTE — Progress Notes (Signed)
Pt discharged to home at 1915. I reviewed discharge instructions with pt. He verbalized understanding. PIV and telemetry discontinued. Pt was accompanied by family at discharge.

## 2015-08-05 ENCOUNTER — Telehealth: Payer: Self-pay

## 2015-08-05 NOTE — Telephone Encounter (Signed)
Pt called, states states he was in the hospital this past Monday and Tuesday, in [facility], and they have scheduled him for a stress test on 8/16, pt just wanted to let Dr. Rockey Situ know, and ask if this is really necessary. Please advise.

## 2015-08-10 NOTE — Telephone Encounter (Signed)
S/w pt who indicated he was ordered outpatient stress test for 8/16 by MD while inpatient at [facility]. Pt asking if test is really necessary.  States when he saw Dr. Rockey Situ "awhile back", he did not think further testing needed. Explained to pt that since hospital admission, MD there has reason to order stress test and recommended pt have it and f/u 9/26 with Dr. Rockey Situ as scheduled. Pt verbalized understanding and states he will have stress test tomorrow but will have discussion with Dr. Rockey Situ at September appointment.

## 2015-08-10 NOTE — Telephone Encounter (Signed)
Pt wanting to ask about below.  Please advise.

## 2015-08-11 ENCOUNTER — Ambulatory Visit
Admission: RE | Admit: 2015-08-11 | Discharge: 2015-08-11 | Disposition: A | Discharge status: Home / Self Care | Payer: PPO | Source: Ambulatory Visit | Attending: Physician Assistant | Admitting: Physician Assistant

## 2015-08-11 DIAGNOSIS — R079 Chest pain, unspecified: Secondary | ICD-10-CM | POA: Insufficient documentation

## 2015-08-11 MED ORDER — TECHNETIUM TC 99M SESTAMIBI - CARDIOLITE
32.2000 | Freq: Once | INTRAVENOUS | Status: AC | PRN
  Administered 2015-08-11: 11:00:00 32.2 via INTRAVENOUS

## 2015-08-11 MED ORDER — REGADENOSON 0.4 MG/5ML IV SOLN
0.4000 mg | Freq: Once | INTRAVENOUS | Status: AC
  Administered 2015-08-11: 0.4 mg via INTRAVENOUS

## 2015-08-11 MED ORDER — TECHNETIUM TC 99M SESTAMIBI - CARDIOLITE
14.0400 | Freq: Once | INTRAVENOUS | Status: AC | PRN
  Administered 2015-08-11: 10:00:00 14.04 via INTRAVENOUS

## 2015-08-26 LAB — NM MYOCAR MULTI W/SPECT W/WALL MOTION / EF
CHL CUP NUCLEAR SDS: 3
CHL CUP NUCLEAR SRS: 30
CHL CUP NUCLEAR SSS: 29
LV dias vol: 128 mL
LV sys vol: 68 mL
NUC STRESS TID: 1.04
Peak HR: 68 {beats}/min
Percent HR: 49 %
Rest HR: 58 {beats}/min

## 2015-09-21 ENCOUNTER — Ambulatory Visit (INDEPENDENT_AMBULATORY_CARE_PROVIDER_SITE_OTHER): Payer: PPO | Admitting: Cardiovascular Disease

## 2015-09-21 ENCOUNTER — Encounter: Payer: Self-pay | Admitting: Cardiovascular Disease

## 2015-09-21 VITALS — BP 142/60 | HR 57 | Ht 66.0 in | Wt 217.8 lb

## 2015-09-21 DIAGNOSIS — I10 Essential (primary) hypertension: Secondary | ICD-10-CM | POA: Diagnosis not present

## 2015-09-21 DIAGNOSIS — N183 Chronic kidney disease, stage 3 unspecified: Secondary | ICD-10-CM

## 2015-09-21 DIAGNOSIS — K219 Gastro-esophageal reflux disease without esophagitis: Secondary | ICD-10-CM

## 2015-09-21 DIAGNOSIS — I25119 Atherosclerotic heart disease of native coronary artery with unspecified angina pectoris: Secondary | ICD-10-CM | POA: Diagnosis not present

## 2015-09-21 DIAGNOSIS — E785 Hyperlipidemia, unspecified: Secondary | ICD-10-CM

## 2015-09-21 NOTE — Patient Instructions (Signed)
You are doing well. No medication changes were made.  Please call us if you have new issues that need to be addressed before your next appt.  Your physician wants you to follow-up in: 6 months.  You will receive a reminder letter in the mail two months in advance. If you don't receive a letter, please call our office to schedule the follow-up appointment.   

## 2015-09-21 NOTE — Assessment & Plan Note (Signed)
Old anterior MI in 2008. Stress test showing no ischemia Ejection fraction last month 40-45% No further testing at this time. He denies any symptoms concerning for angina

## 2015-09-21 NOTE — Progress Notes (Signed)
Patient ID: [name] [name], male    DOB: [birth year], 81 y.o.   MRN: [id number]  HPI Comments: [name] [name] is a pleasant 81 year old gentleman with a history of coronary artery disease, prior stenting to his mid LAD, hypertension, hyperlipidemia, chronic kidney disease who was admitted to the hospital October 21, 2011 with chest pain, shortness of breath, weakness ,cardiac cardiac catheterization was performed with PCI of the OM 2. He presents today for follow-up of his coronary artery disease  Recent hospital admission 08/04/2015 for GI discomfort, chest pain Negative cardiac enzymes, echo as below Acute on chronic bronchitis, treated with ABX He developed a diffuse rash felt secondary to the penicillin based ABX Medication was held and rash resolved. ACE inhibitor was held and changed to losartan, cough is better. referred to GI by primary care for abdominal discomfort. Has been taking PPI with improved symptoms.  Echocardiogram 08/04/2015 showing ejection fraction 45-50% with apical hypokinesis Stress test at the end of August 2016 showing fixed defect, no ischemia, old anterior MI  Lab work one month ago showing total cholesterol 128, LDL 59  Other past medical history  chronic kidney disease, stage III, asymptomatic bradycardia, obesity, right hip replacement 2008, thrombocytopenia, anterior ST elevation MI December 2008 secondary to stent thrombosis in the setting of stopping Plavix treated with bare-metal stenting. Prior DES stent to the LAD in 2006  Prior stress test 01/10/2013 that showed Old scar and the mid to distal anterior wall, apical region.  Previous creatinine 1.9.  Prior cardiac catheterization October 2012 showed left main shows no disease, LAD has 30% proximal disease, 2 overlapping stents in the mid region, 95% ostial diagonal disease jailed by the stents, 95% proximal OM 2 disease, mild plaque in the RCA, hypokinesis of the anterolateral wall, apical region and  inferoapical wall with ejection fraction 30-35%.  EKG shows normal sinus rhythm with rate 48 beats per minute with ST and T wave abnormality who V3 to V6, inferior leads consistent with ischemia  Lab work from June 2014 shows total cholesterol 94, LDL 42, HDL 28 Lab work December 2014 shows total cholesterol 120  Echocardiogram October 20, 2011 shows ejection fraction 45%, apical hypokinesis, mild LVH, mildly dilated left atrium   No Known Allergies  Outpatient Encounter Prescriptions as of 09/21/2015  Medication Sig  . aspirin EC 81 MG EC tablet Take 1 tablet (81 mg total) by mouth daily.  . carvedilol (COREG) 6.25 MG tablet Take 1 tablet (6.25 mg total) by mouth 2 (two) times daily with a meal.  . clopidogrel (PLAVIX) 75 MG tablet Take 75 mg by mouth daily.    Marland Kitchen losartan (COZAAR) 50 MG tablet Take 50 mg by mouth daily.  Marland Kitchen lovastatin (MEVACOR) 40 MG tablet Take 40 mg by mouth at bedtime.  . nitroGLYCERIN (NITROSTAT) 0.4 MG SL tablet Place 0.4 mg under the tongue every 5 (five) minutes as needed for chest pain.  . pantoprazole (PROTONIX) 20 MG tablet Take 20 mg by mouth daily.   . [DISCONTINUED] amLODipine (NORVASC) 5 MG tablet Take 5 mg by mouth daily.    . [DISCONTINUED] amoxicillin (AMOXIL) 125 MG chewable tablet Chew 125 mg by mouth 2 (two) times daily.  . [DISCONTINUED] lisinopril (PRINIVIL,ZESTRIL) 20 MG tablet Take 1 tablet (20 mg total) by mouth daily. (Patient not taking: Reported on 09/21/2015)  . [DISCONTINUED] pantoprazole (PROTONIX) 20 MG tablet Take 1 tablet (20 mg total) by mouth daily. (Patient not taking: Reported on 09/21/2015)  . [DISCONTINUED] rosuvastatin (CRESTOR) 20  MG tablet Take 1 tablet (20 mg total) by mouth daily at 6 PM. (Patient not taking: Reported on 09/21/2015)   No facility-administered encounter medications on file as of 09/21/2015.    Past Medical History  Diagnosis Date  . Hypertension   . Hyperlipidemia   . Tobacco user     Remote  . Renal  insufficiency, mild   . Sinus bradycardia     Assymptomatic  . CAD (coronary artery disease) 10/21/2011    s/p stent placement    Past Surgical History  Procedure Laterality Date  . Transurethral resection of prostate    . Total hip arthroplasty      Right  . Cardiac catheterization  10/21/2011    s/p stent placement  . Cardiac catheterization  2008    stent placement  . Cardiac catheterization  2006    stent placement    Social History  reports that he quit smoking about 46 years ago. His smoking use included Cigarettes. He has a 20 pack-year smoking history. He does not have any smokeless tobacco history on file. He reports that he does not drink alcohol or use illicit drugs.  Family History Family history is unknown by patient.  Review of Systems  Constitutional: Negative.   Respiratory: Negative.   Cardiovascular: Negative.   Gastrointestinal: Negative.   Musculoskeletal: Negative.   Neurological: Negative.   Hematological: Negative.   Psychiatric/Behavioral: Negative.   All other systems reviewed and are negative.   BP 142/60 mmHg  Pulse 57  Ht '5\' 6"'$  (1.676 m)  Wt 217 lb 12 oz (98.771 kg)  BMI 35.16 kg/m2  Physical Exam  Constitutional: He is oriented to person, place, and time. He appears well-developed and well-nourished.  HENT:  Head: Normocephalic.  Nose: Nose normal.  Mouth/Throat: Oropharynx is clear and moist.  Eyes: Conjunctivae are normal. Pupils are equal, round, and reactive to light.  Neck: Normal range of motion. Neck supple. No JVD present.  Cardiovascular: Normal rate, regular rhythm, S1 normal, S2 normal, normal heart sounds and intact distal pulses.  Exam reveals no gallop and no friction rub.   No murmur heard. Pulmonary/Chest: Effort normal and breath sounds normal. No respiratory distress. He has no wheezes. He has no rales. He exhibits no tenderness.  Abdominal: Soft. Bowel sounds are normal. He exhibits no distension. There is no  tenderness.  Musculoskeletal: Normal range of motion. He exhibits no edema or tenderness.  Lymphadenopathy:    He has no cervical adenopathy.  Neurological: He is alert and oriented to person, place, and time. Coordination normal.  Skin: Skin is warm and dry. No rash noted. No erythema.  Psychiatric: He has a normal mood and affect. His behavior is normal. Judgment and thought content normal.      Assessment and Plan   Nursing note and vitals reviewed.

## 2015-09-21 NOTE — Assessment & Plan Note (Signed)
Recently started on PPI with improved symptoms. Was scheduled to see GI and have endoscopy. He is thinking of canceling as he feels better

## 2015-09-21 NOTE — Assessment & Plan Note (Signed)
Slight worsening of his renal failure over the past year, creatinine 1.7

## 2015-09-21 NOTE — Assessment & Plan Note (Signed)
Cholesterol is at goal on the current lipid regimen. No changes to the medications were made. Managed by primary care

## 2015-09-21 NOTE — Assessment & Plan Note (Signed)
Blood pressure is well controlled on today's visit. No changes made to the medications. 

## 2015-10-09 ENCOUNTER — Other Ambulatory Visit: Payer: Self-pay | Admitting: Unknown Physician Specialty

## 2015-10-09 DIAGNOSIS — M25551 Pain in right hip: Secondary | ICD-10-CM

## 2015-10-13 ENCOUNTER — Ambulatory Visit
Admission: RE | Admit: 2015-10-13 | Discharge: 2015-10-13 | Disposition: A | Discharge status: Home / Self Care | Payer: PPO | Source: Ambulatory Visit | Attending: Unknown Physician Specialty | Admitting: Unknown Physician Specialty

## 2015-10-13 DIAGNOSIS — Z96643 Presence of artificial hip joint, bilateral: Secondary | ICD-10-CM | POA: Diagnosis present

## 2015-10-13 DIAGNOSIS — M25551 Pain in right hip: Secondary | ICD-10-CM | POA: Insufficient documentation

## 2015-10-13 MED ORDER — TECHNETIUM TC 99M MEDRONATE IV KIT
25.0000 | PACK | Freq: Once | INTRAVENOUS | Status: AC | PRN
  Administered 2015-10-13: 22.29 via INTRAVENOUS

## 2015-11-03 ENCOUNTER — Other Ambulatory Visit: Payer: Self-pay | Admitting: Unknown Physician Specialty

## 2015-11-03 DIAGNOSIS — M545 Low back pain: Secondary | ICD-10-CM

## 2015-11-03 DIAGNOSIS — M79604 Pain in right leg: Secondary | ICD-10-CM

## 2015-11-16 ENCOUNTER — Ambulatory Visit
Admission: RE | Admit: 2015-11-16 | Discharge: 2015-11-16 | Disposition: A | Discharge status: Home / Self Care | Payer: PPO | Source: Ambulatory Visit | Attending: Unknown Physician Specialty | Admitting: Unknown Physician Specialty

## 2015-11-16 DIAGNOSIS — M47816 Spondylosis without myelopathy or radiculopathy, lumbar region: Secondary | ICD-10-CM | POA: Insufficient documentation

## 2015-11-16 DIAGNOSIS — M4856XA Collapsed vertebra, not elsewhere classified, lumbar region, initial encounter for fracture: Secondary | ICD-10-CM | POA: Diagnosis not present

## 2015-11-16 DIAGNOSIS — M545 Low back pain: Secondary | ICD-10-CM | POA: Insufficient documentation

## 2015-11-16 DIAGNOSIS — M79604 Pain in right leg: Secondary | ICD-10-CM

## 2015-12-31 DIAGNOSIS — G8929 Other chronic pain: Secondary | ICD-10-CM | POA: Diagnosis not present

## 2015-12-31 DIAGNOSIS — M5441 Lumbago with sciatica, right side: Secondary | ICD-10-CM | POA: Diagnosis not present

## 2016-01-11 DIAGNOSIS — L570 Actinic keratosis: Secondary | ICD-10-CM | POA: Diagnosis not present

## 2016-01-11 DIAGNOSIS — Z85828 Personal history of other malignant neoplasm of skin: Secondary | ICD-10-CM | POA: Diagnosis not present

## 2016-01-11 DIAGNOSIS — X32XXXD Exposure to sunlight, subsequent encounter: Secondary | ICD-10-CM | POA: Diagnosis not present

## 2016-01-11 DIAGNOSIS — L820 Inflamed seborrheic keratosis: Secondary | ICD-10-CM | POA: Diagnosis not present

## 2016-01-11 DIAGNOSIS — Z08 Encounter for follow-up examination after completed treatment for malignant neoplasm: Secondary | ICD-10-CM | POA: Diagnosis not present

## 2016-02-23 DIAGNOSIS — E559 Vitamin D deficiency, unspecified: Secondary | ICD-10-CM | POA: Diagnosis not present

## 2016-02-23 DIAGNOSIS — N183 Chronic kidney disease, stage 3 (moderate): Secondary | ICD-10-CM | POA: Diagnosis not present

## 2016-02-23 DIAGNOSIS — I10 Essential (primary) hypertension: Secondary | ICD-10-CM | POA: Diagnosis not present

## 2016-02-23 DIAGNOSIS — I77811 Abdominal aortic ectasia: Secondary | ICD-10-CM | POA: Diagnosis not present

## 2016-02-23 DIAGNOSIS — I25118 Atherosclerotic heart disease of native coronary artery with other forms of angina pectoris: Secondary | ICD-10-CM | POA: Diagnosis not present

## 2016-03-01 DIAGNOSIS — E559 Vitamin D deficiency, unspecified: Secondary | ICD-10-CM | POA: Diagnosis not present

## 2016-03-01 DIAGNOSIS — E784 Other hyperlipidemia: Secondary | ICD-10-CM | POA: Diagnosis not present

## 2016-03-01 DIAGNOSIS — I10 Essential (primary) hypertension: Secondary | ICD-10-CM | POA: Diagnosis not present

## 2016-03-01 DIAGNOSIS — N138 Other obstructive and reflux uropathy: Secondary | ICD-10-CM | POA: Diagnosis not present

## 2016-03-01 DIAGNOSIS — I77811 Abdominal aortic ectasia: Secondary | ICD-10-CM | POA: Diagnosis not present

## 2016-03-01 DIAGNOSIS — N183 Chronic kidney disease, stage 3 (moderate): Secondary | ICD-10-CM | POA: Diagnosis not present

## 2016-03-01 DIAGNOSIS — I25118 Atherosclerotic heart disease of native coronary artery with other forms of angina pectoris: Secondary | ICD-10-CM | POA: Diagnosis not present

## 2016-03-01 DIAGNOSIS — N401 Enlarged prostate with lower urinary tract symptoms: Secondary | ICD-10-CM | POA: Diagnosis not present

## 2016-03-01 DIAGNOSIS — M810 Age-related osteoporosis without current pathological fracture: Secondary | ICD-10-CM | POA: Diagnosis not present

## 2016-03-01 DIAGNOSIS — D696 Thrombocytopenia, unspecified: Secondary | ICD-10-CM | POA: Diagnosis not present

## 2016-04-25 ENCOUNTER — Encounter: Payer: Self-pay | Admitting: Cardiovascular Disease

## 2016-04-25 ENCOUNTER — Ambulatory Visit (INDEPENDENT_AMBULATORY_CARE_PROVIDER_SITE_OTHER): Payer: PPO | Admitting: Cardiovascular Disease

## 2016-04-25 VITALS — BP 130/80 | HR 57 | Ht 66.0 in | Wt 217.5 lb

## 2016-04-25 DIAGNOSIS — N183 Chronic kidney disease, stage 3 unspecified: Secondary | ICD-10-CM

## 2016-04-25 DIAGNOSIS — I25119 Atherosclerotic heart disease of native coronary artery with unspecified angina pectoris: Secondary | ICD-10-CM

## 2016-04-25 DIAGNOSIS — E785 Hyperlipidemia, unspecified: Secondary | ICD-10-CM

## 2016-04-25 DIAGNOSIS — I10 Essential (primary) hypertension: Secondary | ICD-10-CM | POA: Diagnosis not present

## 2016-04-25 NOTE — Assessment & Plan Note (Signed)
Stable renal function, creatinine 1.6

## 2016-04-25 NOTE — Patient Instructions (Signed)
You are doing well. No medication changes were made.  If leg swelling gets worse,  Call the office. We might need lasix/furosemide  Please call us if you have new issues that need to be addressed before your next appt.  Your physician wants you to follow-up in: 12 months.  You will receive a reminder letter in the mail two months in advance. If you don't receive a letter, please call our office to schedule the follow-up appointment.

## 2016-04-25 NOTE — Assessment & Plan Note (Signed)
Blood pressure is well controlled on today's visit. No changes made to the medications. 

## 2016-04-25 NOTE — Progress Notes (Signed)
Patient ID: [name] [name], male    DOB: 01-24-1931, 82 y.o.   MRN: [id number]  HPI Comments: [name] [name] is a pleasant 82 year old gentleman with a history of coronary artery disease, prior stenting to his mid LAD, hypertension, hyperlipidemia, chronic kidney disease who was admitted to the hospital October 21, 2011 with chest pain, shortness of breath, weakness ,cardiac cardiac catheterization was performed with PCI of the OM 2. He presents today for follow-up of his coronary artery disease  In follow-up today, he reports that he is doing relatively well. Working on his garden, playing golf Denies any symptoms concerning for angina, no chest pain, tightness, shortness of breath Currently back on his statin. Was off this for some time, has restarted this Creatinine 1.6, total cholesterol 179, LDL 112  EKG on today's visit shows normal sinus rhythm with rate 56 bpm, left bundle branch block, nonspecific ST and T wave abnormality  Other past medical history reviewed hospital admission 08/04/2015 for GI discomfort, chest pain Negative cardiac enzymes, echo as below Acute on chronic bronchitis, treated with ABX He developed a diffuse rash felt secondary to the penicillin based ABX Medication was held and rash resolved. ACE inhibitor was held and changed to losartan, cough is better. referred to GI by primary care for abdominal discomfort. Has been taking PPI with improved symptoms.  Echocardiogram 08/04/2015 showing ejection fraction 45-50% with apical hypokinesis Stress test at the end of August 2016 showing fixed defect, no ischemia, old anterior MI   chronic kidney disease, stage III, asymptomatic bradycardia, obesity, right hip replacement 2008, thrombocytopenia, anterior ST elevation MI December 2008 secondary to stent thrombosis in the setting of stopping Plavix treated with bare-metal stenting. Prior DES stent to the LAD in 2006  Prior stress test 01/10/2013 that showed Old scar and  the mid to distal anterior wall, apical region.  Previous creatinine 1.9.  Prior cardiac catheterization October 2012 showed left main shows no disease, LAD has 30% proximal disease, 2 overlapping stents in the mid region, 95% ostial diagonal disease jailed by the stents, 95% proximal OM 2 disease, mild plaque in the RCA, hypokinesis of the anterolateral wall, apical region and inferoapical wall with ejection fraction 30-35%.  EKG shows normal sinus rhythm with rate 48 beats per minute with ST and T wave abnormality who V3 to V6, inferior leads consistent with ischemia  Lab work from June 2014 shows total cholesterol 94, LDL 42, HDL 28 Lab work December 2014 shows total cholesterol 120  Echocardiogram October 20, 2011 shows ejection fraction 45%, apical hypokinesis, mild LVH, mildly dilated left atrium   No Known Allergies  Outpatient Encounter Prescriptions as of 04/25/2016  Medication Sig  . aspirin EC 81 MG EC tablet Take 1 tablet (81 mg total) by mouth daily.  . carvedilol (COREG) 6.25 MG tablet Take 1 tablet (6.25 mg total) by mouth 2 (two) times daily with a meal.  . clopidogrel (PLAVIX) 75 MG tablet Take 75 mg by mouth daily.    Marland Kitchen losartan (COZAAR) 50 MG tablet Take 50 mg by mouth daily.  Marland Kitchen lovastatin (MEVACOR) 40 MG tablet Take 40 mg by mouth at bedtime.  . nitroGLYCERIN (NITROSTAT) 0.4 MG SL tablet Place 0.4 mg under the tongue every 5 (five) minutes as needed for chest pain.  . pantoprazole (PROTONIX) 20 MG tablet Take 20 mg by mouth daily.    No facility-administered encounter medications on file as of 04/25/2016.    Past Medical History  Diagnosis Date  . Hypertension   .  Hyperlipidemia   . Tobacco user     Remote  . Renal insufficiency, mild   . Sinus bradycardia     Assymptomatic  . CAD (coronary artery disease) 10/21/2011    s/p stent placement    Past Surgical History  Procedure Laterality Date  . Transurethral resection of prostate    . Total hip arthroplasty       Right  . Cardiac catheterization  10/21/2011    s/p stent placement  . Cardiac catheterization  2008    stent placement  . Cardiac catheterization  2006    stent placement    Social History  reports that he quit smoking about 47 years ago. His smoking use included Cigarettes. He has a 20 pack-year smoking history. He does not have any smokeless tobacco history on file. He reports that he does not drink alcohol or use illicit drugs.  Family History Family history is unknown by patient.  Review of Systems  Constitutional: Negative.   Respiratory: Negative.   Cardiovascular: Negative.   Gastrointestinal: Negative.   Musculoskeletal: Negative.   Neurological: Negative.   Hematological: Negative.   Psychiatric/Behavioral: Negative.   All other systems reviewed and are negative.   BP 130/80 mmHg  Pulse 57  Ht '5\' 6"'$  (1.676 m)  Wt 217 lb 8 oz (98.657 kg)  BMI 35.12 kg/m2  Physical Exam  Constitutional: He is oriented to person, place, and time. He appears well-developed and well-nourished.  HENT:  Head: Normocephalic.  Nose: Nose normal.  Mouth/Throat: Oropharynx is clear and moist.  Eyes: Conjunctivae are normal. Pupils are equal, round, and reactive to light.  Neck: Normal range of motion. Neck supple. No JVD present.  Cardiovascular: Normal rate, regular rhythm, S1 normal, S2 normal, normal heart sounds and intact distal pulses.  Exam reveals no gallop and no friction rub.   No murmur heard. Pulmonary/Chest: Effort normal and breath sounds normal. No respiratory distress. He has no wheezes. He has no rales. He exhibits no tenderness.  Abdominal: Soft. Bowel sounds are normal. He exhibits no distension. There is no tenderness.  Musculoskeletal: Normal range of motion. He exhibits no edema or tenderness.  Lymphadenopathy:    He has no cervical adenopathy.  Neurological: He is alert and oriented to person, place, and time. Coordination normal.  Skin: Skin is warm and  dry. No rash noted. No erythema.  Psychiatric: He has a normal mood and affect. His behavior is normal. Judgment and thought content normal.      Assessment and Plan   Nursing note and vitals reviewed.

## 2016-04-25 NOTE — Assessment & Plan Note (Signed)
Currently with no symptoms of angina. No further workup at this time. Continue current medication regimen. 

## 2016-04-25 NOTE — Assessment & Plan Note (Signed)
For a period of time was off his statin, rash He has restarted his statin, should be back on track with appropriate numbers

## 2016-06-16 DIAGNOSIS — G5602 Carpal tunnel syndrome, left upper limb: Secondary | ICD-10-CM | POA: Diagnosis not present

## 2016-06-16 DIAGNOSIS — G5601 Carpal tunnel syndrome, right upper limb: Secondary | ICD-10-CM | POA: Diagnosis not present

## 2016-06-20 DIAGNOSIS — L570 Actinic keratosis: Secondary | ICD-10-CM | POA: Diagnosis not present

## 2016-06-20 DIAGNOSIS — X32XXXD Exposure to sunlight, subsequent encounter: Secondary | ICD-10-CM | POA: Diagnosis not present

## 2016-06-20 DIAGNOSIS — L500 Allergic urticaria: Secondary | ICD-10-CM | POA: Diagnosis not present

## 2016-06-20 DIAGNOSIS — L820 Inflamed seborrheic keratosis: Secondary | ICD-10-CM | POA: Diagnosis not present

## 2016-06-20 DIAGNOSIS — C44519 Basal cell carcinoma of skin of other part of trunk: Secondary | ICD-10-CM | POA: Diagnosis not present

## 2016-06-20 DIAGNOSIS — S50862A Insect bite (nonvenomous) of left forearm, initial encounter: Secondary | ICD-10-CM | POA: Diagnosis not present

## 2016-06-27 DIAGNOSIS — I25118 Atherosclerotic heart disease of native coronary artery with other forms of angina pectoris: Secondary | ICD-10-CM | POA: Diagnosis not present

## 2016-06-27 DIAGNOSIS — D696 Thrombocytopenia, unspecified: Secondary | ICD-10-CM | POA: Diagnosis not present

## 2016-06-27 DIAGNOSIS — N183 Chronic kidney disease, stage 3 (moderate): Secondary | ICD-10-CM | POA: Diagnosis not present

## 2016-06-27 DIAGNOSIS — I10 Essential (primary) hypertension: Secondary | ICD-10-CM | POA: Diagnosis not present

## 2016-06-27 DIAGNOSIS — G5601 Carpal tunnel syndrome, right upper limb: Secondary | ICD-10-CM | POA: Diagnosis not present

## 2016-06-27 DIAGNOSIS — G5602 Carpal tunnel syndrome, left upper limb: Secondary | ICD-10-CM | POA: Diagnosis not present

## 2016-06-27 DIAGNOSIS — I5022 Chronic systolic (congestive) heart failure: Secondary | ICD-10-CM | POA: Diagnosis not present

## 2016-07-12 ENCOUNTER — Encounter
Admission: RE | Admit: 2016-07-12 | Discharge: 2016-07-12 | Disposition: A | Discharge status: Home / Self Care | Payer: PPO | Source: Ambulatory Visit | Attending: Unknown Physician Specialty | Admitting: Unknown Physician Specialty

## 2016-07-12 DIAGNOSIS — Z01810 Encounter for preprocedural cardiovascular examination: Secondary | ICD-10-CM | POA: Diagnosis not present

## 2016-07-12 DIAGNOSIS — Z01812 Encounter for preprocedural laboratory examination: Secondary | ICD-10-CM | POA: Diagnosis not present

## 2016-07-12 HISTORY — DX: Acute myocardial infarction, unspecified: I21.9

## 2016-07-12 HISTORY — DX: Unspecified osteoarthritis, unspecified site: M19.90

## 2016-07-12 LAB — CBC
HEMATOCRIT: 46 % (ref 40.0–52.0)
HEMOGLOBIN: 15.7 g/dL (ref 13.0–18.0)
MCH: 34.5 pg — AB (ref 26.0–34.0)
MCHC: 34.2 g/dL (ref 32.0–36.0)
MCV: 100.7 fL — AB (ref 80.0–100.0)
Platelets: 121 10*3/uL — ABNORMAL LOW (ref 150–440)
RBC: 4.56 MIL/uL (ref 4.40–5.90)
RDW: 14.4 % (ref 11.5–14.5)
WBC: 7 10*3/uL (ref 3.8–10.6)

## 2016-07-12 LAB — BASIC METABOLIC PANEL
Anion gap: 4 — ABNORMAL LOW (ref 5–15)
BUN: 29 mg/dL — AB (ref 6–20)
CHLORIDE: 106 mmol/L (ref 101–111)
CO2: 29 mmol/L (ref 22–32)
Calcium: 9.4 mg/dL (ref 8.9–10.3)
Creatinine, Ser: 1.72 mg/dL — ABNORMAL HIGH (ref 0.61–1.24)
GFR calc Af Amer: 41 mL/min — ABNORMAL LOW (ref >60)
GFR calc non Af Amer: 35 mL/min — ABNORMAL LOW (ref >60)
GLUCOSE: 100 mg/dL — AB (ref 65–99)
POTASSIUM: 5.4 mmol/L — AB (ref 3.5–5.1)
Sodium: 139 mmol/L (ref 135–145)

## 2016-07-12 NOTE — Patient Instructions (Signed)
  Your procedure is scheduled on: Friday July 22, 2016. Report to Same Day Surgery. To find out your arrival time please call [phone] between 1PM - 3PM on Thursday July 21, 2016.  Remember: Instructions that are not followed completely may result in serious medical risk, up to and including death, or upon the discretion of your surgeon and anesthesiologist your surgery may need to be rescheduled.    _x___ 1. Do not eat food or drink liquids after midnight. No gum chewing or hard candies.     ____ 2. No Alcohol for 24 hours before or after surgery.   ____ 3. Bring all medications with you on the day of surgery if instructed.    __x__ 4. Notify your doctor if there is any change in your medical condition     (cold, fever, infections).     Do not wear jewelry, make-up, hairpins, clips or nail polish.  Do not wear lotions, powders, or perfumes. You may wear deodorant.  Do not shave 48 hours prior to surgery. Men may shave face and neck.  Do not bring valuables to the hospital.    [facility] is not responsible for any belongings or valuables.               Contacts, dentures or bridgework may not be worn into surgery.  Leave your suitcase in the car. After surgery it may be brought to your room.  For patients admitted to the hospital, discharge time is determined by your treatment team.   Patients discharged the day of surgery will not be allowed to drive home.    Please read over the following fact sheets that you were given:   [facility] Preparing for Surgery  _x___ Take these medicines the morning of surgery with A SIP OF WATER:    1. carvedilol (COREG  2. losartan (COZAAR)   ____ Fleet Enema (as directed)   _x___ Use CHG Soap as directed on instruction sheet  ____ Use inhalers on the day of surgery and bring to hospital day of surgery  ____ Stop metformin 2 days prior to surgery    ____ Take 1/2 of usual insulin dose the night before surgery and none on the morning  of  surgery.   __x__ Stop Coumadin/Plavix/aspirin on per Dr. Scharlene Gloss  instructions.  ____ Stop Anti-inflammatories such as Advil, Aleve, Ibuprofen, Motrin, Naproxen,  Naprosyn, Goodies powders or aspirin products.   ____ Stop supplements until after surgery.    ____ Bring C-Pap to the hospital.

## 2016-07-12 NOTE — Pre-Procedure Instructions (Signed)
Compared today's met B results to prior results, (pt does have renal insuffiencey) The results are WNL for pt, shown to S. Fields Therapist, sports, OK to proceed, check potassium day of surgery.

## 2016-07-12 NOTE — Pre-Procedure Instructions (Signed)
EKG on today's visit shows normal sinus rhythm with rate 56 bpm, left bundle branch block, nonspecific ST and T wave abnormality. Echocardiogram 08/04/2015 showing ejection fraction 45-50% with apical hypokinesis Stress test at the end of August 2016 showing fixed defect, no ischemia, old anterior MI Blood pressure is well controlled on today's visit. No changes made to the medications.  Expand All Collapse All   Currently with no symptoms of angina. No further workup at this time. Continue current medication regimen.       CAD, NATIVE VESSEL - Minna Merritts, MD at 04/25/2016 1:57 PM

## 2016-07-12 NOTE — Pre-Procedure Instructions (Signed)
You are doing well. No medication changes were made.  If leg swelling gets worse,  Call the office. We might need lasix/furosemide  Please call us if you have new issues that need to be addressed before your next appt.  Your physician wants you to follow-up in: 12 months.  You will receive a reminder letter in the mail two months in advance. If you don't receive a letter, please call our office to schedule the follow-up appointment.

## 2016-07-12 NOTE — Pre-Procedure Instructions (Signed)
Medical clearance:  1. Bilateral carpal tunnel syndrome with coronary artery disease/congestive heart failure. Patient is scheduled for a low risk orthopedic procedure. His main risk factors are his known coronary artery disease with reduced heart function; he has not had any active cardiac symptoms, does not have significant edema, dysrhythmias, and has good functional capacity. His EKG is stable from previous, including compared to the one that he had along with stress testing done last year. He was evaluated by cardiology within the last 2 months. He appears to be stable from a cardiopulmonary standpoint and should be able to proceed to surgery without further medical evaluation. He is aware that he will need to be off his blood thinners 5-7 days prior to the procedure, and that during this time there will be a slightly increased risk of thrombotic events, but this is unavoidable if he wishes to have the procedure performed. This appears to be acceptably low risk. He will continue to follow-up with cardiology as scheduled and will contact us for any cardiac symptoms that should develop. 2. Hypertension with chronic kidney disease stage [name]. Blood pressure adequately controlled and renal function has been stable. 3. Thrombocytopenia. Platelets have ranged from 130-160 and this should be an acceptably safe level to proceed with surgery as well  [name] [name], MD

## 2016-07-15 DIAGNOSIS — G5603 Carpal tunnel syndrome, bilateral upper limbs: Secondary | ICD-10-CM | POA: Diagnosis not present

## 2016-07-22 ENCOUNTER — Ambulatory Visit: Payer: PPO | Admitting: Certified Registered"

## 2016-07-22 ENCOUNTER — Encounter: Payer: Self-pay | Admitting: *Deleted

## 2016-07-22 ENCOUNTER — Ambulatory Visit
Admission: RE | Admit: 2016-07-22 | Discharge: 2016-07-22 | Disposition: A | Discharge status: Home / Self Care | Payer: PPO | Source: Ambulatory Visit | Attending: Unknown Physician Specialty | Admitting: Unknown Physician Specialty

## 2016-07-22 ENCOUNTER — Encounter
Admission: RE | Disposition: A | Discharge status: Home / Self Care | Payer: Self-pay | Source: Ambulatory Visit | Attending: Unknown Physician Specialty

## 2016-07-22 DIAGNOSIS — I2510 Atherosclerotic heart disease of native coronary artery without angina pectoris: Secondary | ICD-10-CM | POA: Insufficient documentation

## 2016-07-22 DIAGNOSIS — Z87442 Personal history of urinary calculi: Secondary | ICD-10-CM | POA: Insufficient documentation

## 2016-07-22 DIAGNOSIS — Z8269 Family history of other diseases of the musculoskeletal system and connective tissue: Secondary | ICD-10-CM | POA: Diagnosis not present

## 2016-07-22 DIAGNOSIS — Z79899 Other long term (current) drug therapy: Secondary | ICD-10-CM | POA: Insufficient documentation

## 2016-07-22 DIAGNOSIS — I252 Old myocardial infarction: Secondary | ICD-10-CM | POA: Insufficient documentation

## 2016-07-22 DIAGNOSIS — E559 Vitamin D deficiency, unspecified: Secondary | ICD-10-CM | POA: Insufficient documentation

## 2016-07-22 DIAGNOSIS — I129 Hypertensive chronic kidney disease with stage 1 through stage 4 chronic kidney disease, or unspecified chronic kidney disease: Secondary | ICD-10-CM | POA: Insufficient documentation

## 2016-07-22 DIAGNOSIS — D890 Polyclonal hypergammaglobulinemia: Secondary | ICD-10-CM | POA: Insufficient documentation

## 2016-07-22 DIAGNOSIS — N183 Chronic kidney disease, stage 3 (moderate): Secondary | ICD-10-CM | POA: Insufficient documentation

## 2016-07-22 DIAGNOSIS — R0602 Shortness of breath: Secondary | ICD-10-CM | POA: Diagnosis not present

## 2016-07-22 DIAGNOSIS — Z8249 Family history of ischemic heart disease and other diseases of the circulatory system: Secondary | ICD-10-CM | POA: Diagnosis not present

## 2016-07-22 DIAGNOSIS — Z7982 Long term (current) use of aspirin: Secondary | ICD-10-CM | POA: Insufficient documentation

## 2016-07-22 DIAGNOSIS — Z87891 Personal history of nicotine dependence: Secondary | ICD-10-CM | POA: Insufficient documentation

## 2016-07-22 DIAGNOSIS — Z825 Family history of asthma and other chronic lower respiratory diseases: Secondary | ICD-10-CM | POA: Insufficient documentation

## 2016-07-22 DIAGNOSIS — Z955 Presence of coronary angioplasty implant and graft: Secondary | ICD-10-CM | POA: Insufficient documentation

## 2016-07-22 DIAGNOSIS — G5601 Carpal tunnel syndrome, right upper limb: Secondary | ICD-10-CM | POA: Diagnosis not present

## 2016-07-22 DIAGNOSIS — M810 Age-related osteoporosis without current pathological fracture: Secondary | ICD-10-CM | POA: Diagnosis not present

## 2016-07-22 DIAGNOSIS — M1990 Unspecified osteoarthritis, unspecified site: Secondary | ICD-10-CM | POA: Diagnosis not present

## 2016-07-22 DIAGNOSIS — I10 Essential (primary) hypertension: Secondary | ICD-10-CM | POA: Diagnosis not present

## 2016-07-22 DIAGNOSIS — R001 Bradycardia, unspecified: Secondary | ICD-10-CM | POA: Diagnosis not present

## 2016-07-22 DIAGNOSIS — E784 Other hyperlipidemia: Secondary | ICD-10-CM | POA: Diagnosis not present

## 2016-07-22 DIAGNOSIS — Z7902 Long term (current) use of antithrombotics/antiplatelets: Secondary | ICD-10-CM | POA: Insufficient documentation

## 2016-07-22 DIAGNOSIS — D696 Thrombocytopenia, unspecified: Secondary | ICD-10-CM | POA: Insufficient documentation

## 2016-07-22 DIAGNOSIS — Z96643 Presence of artificial hip joint, bilateral: Secondary | ICD-10-CM | POA: Diagnosis not present

## 2016-07-22 DIAGNOSIS — K219 Gastro-esophageal reflux disease without esophagitis: Secondary | ICD-10-CM | POA: Insufficient documentation

## 2016-07-22 HISTORY — PX: CARPAL TUNNEL RELEASE: SHX101

## 2016-07-22 LAB — POCT I-STAT 4, (NA,K, GLUC, HGB,HCT)
Glucose, Bld: 105 mg/dL — ABNORMAL HIGH (ref 65–99)
HCT: 46 % (ref 39.0–52.0)
Hemoglobin: 15.6 g/dL (ref 13.0–17.0)
POTASSIUM: 4.9 mmol/L (ref 3.5–5.1)
SODIUM: 141 mmol/L (ref 135–145)

## 2016-07-22 SURGERY — CARPAL TUNNEL RELEASE
Anesthesia: General | Site: Wrist | Laterality: Right | Wound class: Clean

## 2016-07-22 MED ORDER — BUPIVACAINE HCL (PF) 0.5 % IJ SOLN
INTRAMUSCULAR | Status: AC
  Filled 2016-07-22: qty 30

## 2016-07-22 MED ORDER — FENTANYL CITRATE (PF) 100 MCG/2ML IJ SOLN
INTRAMUSCULAR | Status: DC | PRN
  Administered 2016-07-22 (×3): 50 ug via INTRAVENOUS

## 2016-07-22 MED ORDER — HYDROCODONE-ACETAMINOPHEN 5-325 MG PO TABS: 1.0000 | ORAL_TABLET | Freq: Four times a day (QID) | ORAL | 0 refills | Status: DC | PRN

## 2016-07-22 MED ORDER — EPHEDRINE SULFATE 50 MG/ML IJ SOLN
INTRAMUSCULAR | Status: DC | PRN
  Administered 2016-07-22 (×3): 5 mg via INTRAVENOUS

## 2016-07-22 MED ORDER — BUPIVACAINE HCL (PF) 0.5 % IJ SOLN
INTRAMUSCULAR | Status: DC | PRN
Start: 2016-07-22 — End: 2016-07-22
  Administered 2016-07-22: 7 mL

## 2016-07-22 MED ORDER — ACETAMINOPHEN 10 MG/ML IV SOLN
INTRAVENOUS | Status: DC | PRN
  Administered 2016-07-22: 1000 mg via INTRAVENOUS

## 2016-07-22 MED ORDER — FAMOTIDINE 20 MG PO TABS
20.0000 mg | ORAL_TABLET | Freq: Once | ORAL | Status: AC
  Administered 2016-07-22: 20 mg via ORAL

## 2016-07-22 MED ORDER — ONDANSETRON HCL 4 MG/2ML IJ SOLN: 4.0000 mg | Freq: Once | INTRAMUSCULAR | Status: DC | PRN

## 2016-07-22 MED ORDER — PROPOFOL 10 MG/ML IV BOLUS
INTRAVENOUS | Status: DC | PRN
  Administered 2016-07-22: 140 mg via INTRAVENOUS

## 2016-07-22 MED ORDER — FAMOTIDINE 20 MG PO TABS
ORAL_TABLET | ORAL | Status: AC
  Administered 2016-07-22: 20 mg via ORAL
  Filled 2016-07-22: qty 1

## 2016-07-22 MED ORDER — ONDANSETRON HCL 4 MG/2ML IJ SOLN
INTRAMUSCULAR | Status: DC | PRN
  Administered 2016-07-22: 4 mg via INTRAVENOUS

## 2016-07-22 MED ORDER — LIDOCAINE HCL (CARDIAC) 20 MG/ML IV SOLN
INTRAVENOUS | Status: DC | PRN
  Administered 2016-07-22: 80 mg via INTRAVENOUS

## 2016-07-22 MED ORDER — ACETAMINOPHEN 10 MG/ML IV SOLN
INTRAVENOUS | Status: AC
  Filled 2016-07-22: qty 100

## 2016-07-22 MED ORDER — SODIUM CHLORIDE 0.9 % IV SOLN: INTRAVENOUS | Status: DC

## 2016-07-22 MED ORDER — LACTATED RINGERS IV SOLN
INTRAVENOUS | Status: DC
  Administered 2016-07-22: 07:00:00 via INTRAVENOUS

## 2016-07-22 MED ORDER — FENTANYL CITRATE (PF) 100 MCG/2ML IJ SOLN: 25.0000 ug | INTRAMUSCULAR | Status: DC | PRN

## 2016-07-22 SURGICAL SUPPLY — 27 items
BANDAGE ELASTIC 2 LF NS (GAUZE/BANDAGES/DRESSINGS) ×3 IMPLANT
BNDG CMPR MED 5X2 ELC HKLP NS (GAUZE/BANDAGES/DRESSINGS) ×1
BNDG ESMARK 4X12 TAN STRL LF (GAUZE/BANDAGES/DRESSINGS) ×3 IMPLANT
CHLORAPREP W/TINT 26ML (MISCELLANEOUS) ×3 IMPLANT
CUFF TOURN 18 STER (MISCELLANEOUS) ×3 IMPLANT
ELECT REM PT RETURN 9FT ADLT (ELECTROSURGICAL) ×3
ELECTRODE REM PT RTRN 9FT ADLT (ELECTROSURGICAL) ×1 IMPLANT
GAUZE SPONGE 4X4 12PLY STRL (GAUZE/BANDAGES/DRESSINGS) ×3 IMPLANT
GLOVE BIO SURGEON STRL SZ7.5 (GLOVE) ×3 IMPLANT
GLOVE BIO SURGEON STRL SZ8 (GLOVE) ×6 IMPLANT
GLOVE BIOGEL M STRL SZ7.5 (GLOVE) ×3 IMPLANT
GLOVE INDICATOR 8.0 STRL GRN (GLOVE) ×3 IMPLANT
GOWN STRL REUS W/ TWL LRG LVL3 (GOWN DISPOSABLE) ×2 IMPLANT
GOWN STRL REUS W/TWL LRG LVL3 (GOWN DISPOSABLE) ×6
KIT RM TURNOVER STRD PROC AR (KITS) ×3 IMPLANT
NS IRRIG 500ML POUR BTL (IV SOLUTION) ×3 IMPLANT
PACK EXTREMITY ARMC (MISCELLANEOUS) ×3 IMPLANT
PADDING CAST 2X4YD ST (MISCELLANEOUS) ×2
PADDING CAST BLEND 2X4 STRL (MISCELLANEOUS) ×1 IMPLANT
SOL PREP PVP 2OZ (MISCELLANEOUS) ×3
SOLUTION PREP PVP 2OZ (MISCELLANEOUS) ×1 IMPLANT
SPLINT CAST 1 STEP 3X12 (MISCELLANEOUS) ×3 IMPLANT
STOCKINETTE 48X4 2 PLY STRL (GAUZE/BANDAGES/DRESSINGS) ×1 IMPLANT
STOCKINETTE STRL 4IN 9604848 (GAUZE/BANDAGES/DRESSINGS) ×3 IMPLANT
SUT ETHILON 4-0 (SUTURE) ×3
SUT ETHILON 4-0 FS2 18XMFL BLK (SUTURE) ×1
SUTURE ETHLN 4-0 FS2 18XMF BLK (SUTURE) ×1 IMPLANT

## 2016-07-22 NOTE — Anesthesia Procedure Notes (Signed)
Procedure Name: LMA Insertion Performed by: Lance Muss Pre-anesthesia Checklist: Patient identified, Patient being monitored, Timeout performed, Emergency Drugs available and Suction available Patient Re-evaluated:Patient Re-evaluated prior to inductionOxygen Delivery Method: Circle system utilized Preoxygenation: Pre-oxygenation with 100% oxygen Intubation Type: IV induction Ventilation: Mask ventilation without difficulty LMA: LMA inserted Tube type: Oral Tube size: 4.0 mm Number of attempts: 2 Placement Confirmation: positive ETCO2 and breath sounds checked- equal and bilateral Tube secured with: Tape Dental Injury: Teeth and Oropharynx as per pre-operative assessment  Comments: First attempt did not seat well with leak, second attempt no significant leak with +ETCO2, +BBS.

## 2016-07-22 NOTE — H&P (Signed)
  H and P reviewed. No changes. Uploaded at later date. 

## 2016-07-22 NOTE — Discharge Instructions (Signed)
AMBULATORY SURGERY  DISCHARGE INSTRUCTIONS   1) The drugs that you were given will stay in your system until tomorrow so for the next 24 hours you should not:  A) Drive an automobile B) Make any legal decisions C) Drink any alcoholic beverage   2) You may resume regular meals tomorrow.  Today it is better to start with liquids and gradually work up to solid foods.  You may eat anything you prefer, but it is better to start with liquids, then soup and crackers, and gradually work up to solid foods.   3) Please notify your doctor immediately if you have any unusual bleeding, trouble breathing, redness and pain at the surgery site, drainage, fever, or pain not relieved by medication.    4) Additional Instructions:        Please contact your physician with any problems or Same Day Surgery at [phone], Monday through Friday 6 am to 4 pm, or  at [facility] number at [phone].  Ice pack  Elevation Keep dressing dry RTC  in about 2 weeks

## 2016-07-22 NOTE — Anesthesia Preprocedure Evaluation (Signed)
Anesthesia Evaluation  Patient identified by MRN, date of birth, ID band Patient awake    Reviewed: Allergy & Precautions, H&P , NPO status , Patient's Chart, lab work & pertinent test results, reviewed documented beta blocker date and time   History of Anesthesia Complications Negative for: history of anesthetic complications  Airway Mallampati: III  TM Distance: >3 FB Neck ROM: full    Dental no notable dental hx. (+) Teeth Intact   Pulmonary shortness of breath and with exertion, neg sleep apnea, neg COPD, neg recent URI, former smoker,    Pulmonary exam normal breath sounds clear to auscultation       Cardiovascular Exercise Tolerance: Good hypertension, (-) angina+ CAD, + Past MI and + Cardiac Stents  (-) CABG Normal cardiovascular exam+ dysrhythmias + Valvular Problems/Murmurs  Rhythm:regular Rate:Normal     Neuro/Psych negative neurological ROS  negative psych ROS   GI/Hepatic Neg liver ROS, GERD  ,  Endo/Other  negative endocrine ROS  Renal/GU CRFRenal disease  negative genitourinary   Musculoskeletal   Abdominal   Peds  Hematology negative hematology ROS (+)   Anesthesia Other Findings Past Medical History: No date: Arthritis 10/21/2011: CAD (coronary artery disease)     Comment: s/p stent placement No date: Hyperlipidemia No date: Hypertension 2008 & 2013: Myocardial infarction ([place]) No date: Renal insufficiency, mild No date: Sinus bradycardia     Comment: Assymptomatic No date: Tobacco user     Comment: Remote   Reproductive/Obstetrics negative OB ROS                             Anesthesia Physical Anesthesia Plan  ASA: III  Anesthesia Plan: General   Post-op Pain Management:    Induction:   Airway Management Planned:   Additional Equipment:   Intra-op Plan:   Post-operative Plan:   Informed Consent: I have reviewed the patients History and Physical,  chart, labs and discussed the procedure including the risks, benefits and alternatives for the proposed anesthesia with the patient or authorized representative who has indicated his/her understanding and acceptance.   Dental Advisory Given  Plan Discussed with: Anesthesiologist, CRNA and Surgeon  Anesthesia Plan Comments:         Anesthesia Quick Evaluation

## 2016-07-22 NOTE — Op Note (Signed)
DATE OF SURGERY:  07/22/2016  PATIENT NAME:  [name] [name]   DOB: 1931/03/15  MRN: [id number]  PRE-OPERATIVE DIAGNOSIS: Right carpal tunnel syndrome  POST-OPERATIVE DIAGNOSIS:  Same  PROCEDURE: Right carpal tunnel release  SURGEON: Dr. Leanor Kail, Brooke Bonito. M.D.  ANESTHESIA: Gen.   INDICATIONS FOR SURGERY: [name] is a 82 y.o. year old male with a long history of numbness and paresthesias in the right hand. Nerve conduction studies demonstrated findings consistent with moderately severe  median nerve compression.The patient had not seen any significant improvement despite conservative nonsurgical intervention. After discussion of the risks and benefits of surgical intervention, the patient expressed understanding of the risks benefits and agreed with plans for carpal tunnel release.   PROCEDURE IN DETAIL: The patient was taken the operating room where satisfactory general anesthesia was achieved. A tourniquet was placed on the patient's right upper arm.The right hand and arm were prepped  and draped in the usual sterile fashion. A "time-out" was performed as per usual protocol. The hand and forearm were exsanguinated using an Esmarch and the tourniquet was inflated to 250 mmHg.  An incision was made just ulnar to the thenar palmar crease. Dissection was carried down through the palmar fascia to the transverse carpal ligament. The transverse carpal ligament was sharply incised, taking care to protect the underlying structures within the carpal tunnel. Complete release of the transverse carpal ligament was achieved. There was no evidence of a mass or proliferative synovitis within the carpal tunnel. The wound was irrigated with saline. The tourniquet was released at this time. It had been up for about 11 minutes. Bleeding was controlled with digital pressure and coagulation cautery. I did inject the subcutaneous tissue of the wound with about 5 cc of 0.5% Marcaine without epinephrine. The skin  was then re-approximated with interrupted sutures of #4-0 nylon. A sterile dressing was applied followed by application of a volar splint.  The patient was awakened and transferred to a stretcher bed.  The patient tolerated the procedure well and was transported to the PACU in stable condition. Blood loss was negligible.  Dr. Mariel Kansky. M.D.

## 2016-07-22 NOTE — Transfer of Care (Signed)
Immediate Anesthesia Transfer of Care Note  Patient: [name] [name]  Procedure(s) Performed: Procedure(s): CARPAL TUNNEL RELEASE (Right)  Patient Location: PACU  Anesthesia Type:General  Level of Consciousness: sedated and responds to stimulation  Airway & Oxygen Therapy: Patient Spontanous Breathing and Patient connected to face mask oxygen  Post-op Assessment: Report given to RN and Post -op Vital signs reviewed and stable  Post vital signs: Reviewed and stable  Last Vitals:  Vitals:   07/22/16 0859 07/22/16 0901  BP: (!) 126/57 (!) 126/57  Pulse: (!) 52 (!) 51  Resp: 13 20  Temp: (!) 36 C     Last Pain:  Vitals:   07/22/16 0558  TempSrc: Oral  PainSc: 3       Patients Stated Pain Goal: 2 (95/09/32 6712)  Complications: No apparent anesthesia complications

## 2016-07-22 NOTE — Anesthesia Postprocedure Evaluation (Signed)
Anesthesia Post Note  Patient: [name] [name]  Procedure(s) Performed: Procedure(s) (LRB): CARPAL TUNNEL RELEASE (Right)  Patient location during evaluation: PACU Anesthesia Type: General Level of consciousness: awake and alert Pain management: pain level controlled Vital Signs Assessment: post-procedure vital signs reviewed and stable Respiratory status: spontaneous breathing, nonlabored ventilation, respiratory function stable and patient connected to nasal cannula oxygen Cardiovascular status: blood pressure returned to baseline and stable Postop Assessment: no signs of nausea or vomiting Anesthetic complications: no    Last Vitals:  Vitals:   07/22/16 0954 07/22/16 1013  BP: (!) 156/63 (!) 142/69  Pulse: (!) 51 (!) 53  Resp: 18 18  Temp:      Last Pain:  Vitals:   07/22/16 0914  TempSrc:   PainSc: Asleep                 Martha Clan

## 2016-07-28 DIAGNOSIS — C44519 Basal cell carcinoma of skin of other part of trunk: Secondary | ICD-10-CM | POA: Diagnosis not present

## 2016-07-28 DIAGNOSIS — L570 Actinic keratosis: Secondary | ICD-10-CM | POA: Diagnosis not present

## 2016-07-28 DIAGNOSIS — D1801 Hemangioma of skin and subcutaneous tissue: Secondary | ICD-10-CM | POA: Diagnosis not present

## 2016-07-28 DIAGNOSIS — X32XXXD Exposure to sunlight, subsequent encounter: Secondary | ICD-10-CM | POA: Diagnosis not present

## 2016-07-28 DIAGNOSIS — L820 Inflamed seborrheic keratosis: Secondary | ICD-10-CM | POA: Diagnosis not present

## 2016-08-01 DIAGNOSIS — G5601 Carpal tunnel syndrome, right upper limb: Secondary | ICD-10-CM | POA: Diagnosis not present

## 2016-08-10 DIAGNOSIS — L719 Rosacea, unspecified: Secondary | ICD-10-CM | POA: Diagnosis not present

## 2016-08-10 DIAGNOSIS — H0014 Chalazion left upper eyelid: Secondary | ICD-10-CM | POA: Diagnosis not present

## 2016-08-10 DIAGNOSIS — L03211 Cellulitis of face: Secondary | ICD-10-CM | POA: Diagnosis not present

## 2016-08-23 DIAGNOSIS — B0052 Herpesviral keratitis: Secondary | ICD-10-CM | POA: Diagnosis not present

## 2016-08-25 DIAGNOSIS — Z08 Encounter for follow-up examination after completed treatment for malignant neoplasm: Secondary | ICD-10-CM | POA: Diagnosis not present

## 2016-08-25 DIAGNOSIS — X32XXXD Exposure to sunlight, subsequent encounter: Secondary | ICD-10-CM | POA: Diagnosis not present

## 2016-08-25 DIAGNOSIS — Z85828 Personal history of other malignant neoplasm of skin: Secondary | ICD-10-CM | POA: Diagnosis not present

## 2016-08-25 DIAGNOSIS — D696 Thrombocytopenia, unspecified: Secondary | ICD-10-CM | POA: Diagnosis not present

## 2016-08-25 DIAGNOSIS — L820 Inflamed seborrheic keratosis: Secondary | ICD-10-CM | POA: Diagnosis not present

## 2016-08-25 DIAGNOSIS — I10 Essential (primary) hypertension: Secondary | ICD-10-CM | POA: Diagnosis not present

## 2016-08-25 DIAGNOSIS — L570 Actinic keratosis: Secondary | ICD-10-CM | POA: Diagnosis not present

## 2016-08-25 DIAGNOSIS — S60862A Insect bite (nonvenomous) of left wrist, initial encounter: Secondary | ICD-10-CM | POA: Diagnosis not present

## 2016-08-26 DIAGNOSIS — H1820 Unspecified corneal edema: Secondary | ICD-10-CM | POA: Diagnosis not present

## 2016-08-30 DIAGNOSIS — H1820 Unspecified corneal edema: Secondary | ICD-10-CM | POA: Diagnosis not present

## 2016-09-01 DIAGNOSIS — I25118 Atherosclerotic heart disease of native coronary artery with other forms of angina pectoris: Secondary | ICD-10-CM | POA: Diagnosis not present

## 2016-09-01 DIAGNOSIS — N183 Chronic kidney disease, stage 3 (moderate): Secondary | ICD-10-CM | POA: Diagnosis not present

## 2016-09-01 DIAGNOSIS — L719 Rosacea, unspecified: Secondary | ICD-10-CM | POA: Diagnosis not present

## 2016-09-01 DIAGNOSIS — I5022 Chronic systolic (congestive) heart failure: Secondary | ICD-10-CM | POA: Diagnosis not present

## 2016-09-01 DIAGNOSIS — E784 Other hyperlipidemia: Secondary | ICD-10-CM | POA: Diagnosis not present

## 2016-09-01 DIAGNOSIS — M810 Age-related osteoporosis without current pathological fracture: Secondary | ICD-10-CM | POA: Diagnosis not present

## 2016-09-01 DIAGNOSIS — E538 Deficiency of other specified B group vitamins: Secondary | ICD-10-CM | POA: Diagnosis not present

## 2016-09-01 DIAGNOSIS — E559 Vitamin D deficiency, unspecified: Secondary | ICD-10-CM | POA: Diagnosis not present

## 2016-09-01 DIAGNOSIS — D696 Thrombocytopenia, unspecified: Secondary | ICD-10-CM | POA: Diagnosis not present

## 2016-09-01 DIAGNOSIS — I10 Essential (primary) hypertension: Secondary | ICD-10-CM | POA: Diagnosis not present

## 2016-09-01 DIAGNOSIS — I77811 Abdominal aortic ectasia: Secondary | ICD-10-CM | POA: Diagnosis not present

## 2016-09-06 DIAGNOSIS — H1820 Unspecified corneal edema: Secondary | ICD-10-CM | POA: Diagnosis not present

## 2016-09-13 DIAGNOSIS — B0052 Herpesviral keratitis: Secondary | ICD-10-CM | POA: Diagnosis not present

## 2016-09-27 DIAGNOSIS — B0052 Herpesviral keratitis: Secondary | ICD-10-CM | POA: Diagnosis not present

## 2016-12-27 DIAGNOSIS — B0052 Herpesviral keratitis: Secondary | ICD-10-CM | POA: Diagnosis not present

## 2017-01-20 DIAGNOSIS — B0052 Herpesviral keratitis: Secondary | ICD-10-CM | POA: Diagnosis not present

## 2017-02-21 DIAGNOSIS — D696 Thrombocytopenia, unspecified: Secondary | ICD-10-CM | POA: Diagnosis not present

## 2017-02-21 DIAGNOSIS — I10 Essential (primary) hypertension: Secondary | ICD-10-CM | POA: Diagnosis not present

## 2017-02-21 DIAGNOSIS — E538 Deficiency of other specified B group vitamins: Secondary | ICD-10-CM | POA: Diagnosis not present

## 2017-03-02 DIAGNOSIS — M810 Age-related osteoporosis without current pathological fracture: Secondary | ICD-10-CM | POA: Diagnosis not present

## 2017-03-02 DIAGNOSIS — E538 Deficiency of other specified B group vitamins: Secondary | ICD-10-CM | POA: Diagnosis not present

## 2017-03-02 DIAGNOSIS — N183 Chronic kidney disease, stage 3 (moderate): Secondary | ICD-10-CM | POA: Diagnosis not present

## 2017-03-02 DIAGNOSIS — D696 Thrombocytopenia, unspecified: Secondary | ICD-10-CM | POA: Diagnosis not present

## 2017-03-02 DIAGNOSIS — I5022 Chronic systolic (congestive) heart failure: Secondary | ICD-10-CM | POA: Diagnosis not present

## 2017-03-02 DIAGNOSIS — I10 Essential (primary) hypertension: Secondary | ICD-10-CM | POA: Diagnosis not present

## 2017-03-02 DIAGNOSIS — E784 Other hyperlipidemia: Secondary | ICD-10-CM | POA: Diagnosis not present

## 2017-03-02 DIAGNOSIS — I25118 Atherosclerotic heart disease of native coronary artery with other forms of angina pectoris: Secondary | ICD-10-CM | POA: Diagnosis not present

## 2017-03-02 DIAGNOSIS — I77811 Abdominal aortic ectasia: Secondary | ICD-10-CM | POA: Diagnosis not present

## 2017-03-02 DIAGNOSIS — E559 Vitamin D deficiency, unspecified: Secondary | ICD-10-CM | POA: Diagnosis not present

## 2017-04-21 DIAGNOSIS — B0052 Herpesviral keratitis: Secondary | ICD-10-CM | POA: Diagnosis not present

## 2017-04-29 NOTE — Progress Notes (Signed)
Cardiology Office Note  Date:  05/01/2017   ID:  SPIKE DESILETS, DOB Jan 13, 1931, MRN [id number]  PCP:  [name], MD   Chief Complaint  Patient presents with  . other    12 month follow up. Patient c/o SOB but states he thinks it is from him being over weight.  Meds reviewed verbally with patient.     HPI:  Mr. Chery is a pleasant 83 year old gentleman with a history of  Stopped smoking in 1969 coronary artery disease,  prior stenting to his mid LAD,  in 2006, anterior ST elevation MI December 2008 secondary to ISR hypertension,  hyperlipidemia,  chronic kidney disease  admitted to the hospital October 21, 2011 with chest pain cardiac cardiac catheterization  with PCI of the OM 2. He presents today for follow-up of his coronary artery disease  wants to stay on plavix Concerned that when he stopped Plavix in the past he had stent thrombosis, this was back in 2008  He is having some shortness of breath, unclear if it is fluid retention, deconditioning or blockage Is not been taking any Lasix He does report some leg swelling Now more active in the yard Did not do regular exercise program over the winter  Denies any symptoms concerning for angina, no chest pain, tightness  Previous labs reviewed personally by myself and with the patient on todays visit Total chol 106 LDL 53 CR 1.8  EKG personally reviewed by myself on todays visit Shows sinus bradycardia rate 49 bpm left bundle branch block nonspecific ST abnormality  Other past medical history reviewed hospital admission 08/04/2015 for GI discomfort, chest pain Negative cardiac enzymes, echo as below Acute on chronic bronchitis, treated with ABX He developed a diffuse rash felt secondary to the penicillin based ABX Medication was held and rash resolved. ACE inhibitor was held and changed to losartan, cough is better. referred to GI by primary care for abdominal discomfort. Has been taking PPI with improved  symptoms.  Echocardiogram 08/04/2015 showing ejection fraction 45-50% with apical hypokinesis Stress test at the end of August 2016 showing fixed defect, no ischemia, old anterior MI   chronic kidney disease, stage III, asymptomatic bradycardia, obesity, right hip replacement 2008, thrombocytopenia, anterior ST elevation MI December 2008 secondary to stent thrombosis in the setting of stopping Plavix treated with bare-metal stenting. Prior DES stent to the LAD in 2006  Prior stress test 01/10/2013 that showed Old scar and the mid to distal anterior wall, apical region.  Previous creatinine 1.9.  Prior cardiac catheterization October 2012 showed left main shows no disease, LAD has 30% proximal disease, 2 overlapping stents in the mid region, 95% ostial diagonal disease jailed by the stents, 95% proximal OM 2 disease, mild plaque in the RCA, hypokinesis of the anterolateral wall, apical region and inferoapical wall with ejection fraction 30-35%.  Lab work from June 2014 shows total cholesterol 94, LDL 42, HDL 28 Lab work December 2014 shows total cholesterol 120  Echocardiogram October 20, 2011 shows ejection fraction 45%, apical hypokinesis, mild LVH, mildly dilated left atrium   PMH:   has a past medical history of Arthritis; CAD (coronary artery disease) (10/21/2011); Hyperlipidemia; Hypertension; Myocardial infarction [facility]) (2008 & 2013); Renal insufficiency, mild; Sinus bradycardia; and Tobacco user.  PSH:    Past Surgical History:  Procedure Laterality Date  . CARDIAC CATHETERIZATION  10/21/2011   s/p stent placement  . CARDIAC CATHETERIZATION  2008   stent placement  . CARDIAC CATHETERIZATION  2006  stent placement  . CARPAL TUNNEL RELEASE Right 07/22/2016   Procedure: CARPAL TUNNEL RELEASE;  Surgeon: Leanor Kail, MD;  Location: [facility] ORS;  Service: Orthopedics;  Laterality: Right;  . TOTAL HIP ARTHROPLASTY     Right  . TRANSURETHRAL RESECTION OF PROSTATE       Current Outpatient Prescriptions  Medication Sig Dispense Refill  . aspirin EC 81 MG EC tablet Take 1 tablet (81 mg total) by mouth daily. 30 tablet 0  . carvedilol (COREG) 6.25 MG tablet Take 1 tablet (6.25 mg total) by mouth 2 (two) times daily with a meal. (Patient taking differently: Take 3.125 mg by mouth 2 (two) times daily with a meal. ) 180 tablet 3  . clopidogrel (PLAVIX) 75 MG tablet Take 75 mg by mouth daily.      Marland Kitchen losartan (COZAAR) 50 MG tablet Take 50 mg by mouth daily.    Marland Kitchen lovastatin (MEVACOR) 40 MG tablet Take 40 mg by mouth at bedtime.    . nitroGLYCERIN (NITROSTAT) 0.4 MG SL tablet Place 0.4 mg under the tongue every 5 (five) minutes as needed for chest pain.     No current facility-administered medications for this visit.      Allergies:   Amoxicillin   Social History:  The patient  reports that he quit smoking about 48 years ago. His smoking use included Cigarettes. He has a 20.00 pack-year smoking history. He has never used smokeless tobacco. He reports that he does not drink alcohol or use drugs.   Family History:   Family history is unknown by patient.    Review of Systems: Review of Systems  Constitutional: Negative.   Respiratory: Positive for shortness of breath.   Cardiovascular: Negative.   Gastrointestinal: Negative.   Musculoskeletal: Negative.   Neurological: Negative.   Psychiatric/Behavioral: Negative.   All other systems reviewed and are negative.    PHYSICAL EXAM: VS:  BP (!) 138/58 (BP Location: Left Arm, Patient Position: Sitting, Cuff Size: Normal)   Pulse (!) 49   Ht '5\' 7"'$  (1.702 m)   Wt 220 lb (99.8 kg)   BMI 34.46 kg/m  , BMI Body mass index is 34.46 kg/m. GEN: Well nourished, well developed, in no acute distress , Obese  HEENT: normal  Neck: no JVD, carotid bruits, or masses Cardiac: RRR; no murmurs, rubs, or gallops, trace pitting lower extremity  edema  Respiratory:  clear to auscultation bilaterally, normal work of  breathing GI: soft, nontender, nondistended, + BS MS: no deformity or atrophy  Skin: warm and dry, no rash Neuro:  Strength and sensation are intact Psych: euthymic mood, full affect    Recent Labs: 07/12/2016: BUN 29; Creatinine, Ser 1.72; Platelets 121 07/22/2016: Hemoglobin 15.6; Potassium 4.9; Sodium 141    Lipid Panel Lab Results  Component Value Date   CHOL 156 10/21/2011   HDL 27 (L) 10/21/2011   LDLCALC 95 10/21/2011   TRIG 172 (H) 10/21/2011      Wt Readings from Last 3 Encounters:  05/01/17 220 lb (99.8 kg)  07/22/16 215 lb (97.5 kg)  07/12/16 215 lb (97.5 kg)       ASSESSMENT AND PLAN:  Atherosclerosis of native coronary artery with stable angina pectoris, unspecified whether native or transplanted heart ([place]) - Plan: EKG 12-Lead Shortness of breath symptoms, long discussion with him Unclear if he is having fluid retention, deconditioning, or angina He will try Lasix for several days, work on his conditioning If no improvement in symptoms he will call our office  for stress testing  HYPERTENSION, BENIGN - Plan: EKG 12-Lead Recommended he decrease carvedilol down to 3.125 mg in the morning, hold the evening pill given his bradycardia Recommended he monitor blood pressure at home and call our office if this runs high. If it does run high losartan dose could be increased  Mixed hyperlipidemia - Plan: EKG 12-Lead Cholesterol is at goal on the current lipid regimen. No changes to the medications were made.  Chronic renal failure, stage 3 (moderate) - Plan: EKG 12-Lead Stable renal function creatinine 1.8 Recommended he avoid NSAIDs  Shortness of breath As above long discussion concerning his symptoms He will try exercise, Lasix when necessary If no improvement in symptoms he will call our office   Total encounter time more than 25 minutes  Greater than 50% was spent in counseling and coordination of care with the patient   Disposition:   F/U  6  months   Orders Placed This Encounter  Procedures  . EKG 12-Lead     Signed, Esmond Plants, M.D., Ph.D. 05/01/2017  [facility] [place], [place] [phone]

## 2017-05-01 ENCOUNTER — Ambulatory Visit (INDEPENDENT_AMBULATORY_CARE_PROVIDER_SITE_OTHER): Payer: PPO | Admitting: Cardiovascular Disease

## 2017-05-01 ENCOUNTER — Encounter: Payer: Self-pay | Admitting: Cardiovascular Disease

## 2017-05-01 VITALS — BP 138/58 | HR 49 | Ht 67.0 in | Wt 220.0 lb

## 2017-05-01 DIAGNOSIS — E782 Mixed hyperlipidemia: Secondary | ICD-10-CM | POA: Diagnosis not present

## 2017-05-01 DIAGNOSIS — R0602 Shortness of breath: Secondary | ICD-10-CM | POA: Diagnosis not present

## 2017-05-01 DIAGNOSIS — I25118 Atherosclerotic heart disease of native coronary artery with other forms of angina pectoris: Secondary | ICD-10-CM

## 2017-05-01 DIAGNOSIS — I10 Essential (primary) hypertension: Secondary | ICD-10-CM

## 2017-05-01 DIAGNOSIS — N183 Chronic kidney disease, stage 3 unspecified: Secondary | ICD-10-CM

## 2017-05-01 NOTE — Patient Instructions (Addendum)
Medication Instructions:   Please hold the evening coreg  one a day in the Am  Monitor BP Goal is <140 on the top  Try a lasix and banana for a few days for breathing  Labwork:  No new labs needed  Testing/Procedures:  No further testing at this time   I recommend watching educational videos on topics of interest to you at:       www.goemmi.com  Enter code: [email or web address]    Follow-Up: It was a pleasure seeing you in the office today. Please call us if you have new issues that need to be addressed before your next appt.  [phone]  Your physician wants you to follow-up in: 6 months.  You will receive a reminder letter in the mail two months in advance. If you don't receive a letter, please call our office to schedule the follow-up appointment.  If you need a refill on your cardiac medications before your next appointment, please call your pharmacy.

## 2017-05-23 DIAGNOSIS — I77811 Abdominal aortic ectasia: Secondary | ICD-10-CM | POA: Diagnosis not present

## 2017-05-23 DIAGNOSIS — I10 Essential (primary) hypertension: Secondary | ICD-10-CM | POA: Diagnosis not present

## 2017-05-23 DIAGNOSIS — I25118 Atherosclerotic heart disease of native coronary artery with other forms of angina pectoris: Secondary | ICD-10-CM | POA: Diagnosis not present

## 2017-05-29 DIAGNOSIS — D696 Thrombocytopenia, unspecified: Secondary | ICD-10-CM | POA: Diagnosis not present

## 2017-05-29 DIAGNOSIS — E784 Other hyperlipidemia: Secondary | ICD-10-CM | POA: Diagnosis not present

## 2017-05-29 DIAGNOSIS — M810 Age-related osteoporosis without current pathological fracture: Secondary | ICD-10-CM | POA: Diagnosis not present

## 2017-05-29 DIAGNOSIS — I5022 Chronic systolic (congestive) heart failure: Secondary | ICD-10-CM | POA: Diagnosis not present

## 2017-05-29 DIAGNOSIS — E538 Deficiency of other specified B group vitamins: Secondary | ICD-10-CM | POA: Diagnosis not present

## 2017-05-29 DIAGNOSIS — I77811 Abdominal aortic ectasia: Secondary | ICD-10-CM | POA: Diagnosis not present

## 2017-05-29 DIAGNOSIS — E559 Vitamin D deficiency, unspecified: Secondary | ICD-10-CM | POA: Diagnosis not present

## 2017-05-29 DIAGNOSIS — N183 Chronic kidney disease, stage 3 (moderate): Secondary | ICD-10-CM | POA: Diagnosis not present

## 2017-05-29 DIAGNOSIS — I10 Essential (primary) hypertension: Secondary | ICD-10-CM | POA: Diagnosis not present

## 2017-05-29 DIAGNOSIS — I25118 Atherosclerotic heart disease of native coronary artery with other forms of angina pectoris: Secondary | ICD-10-CM | POA: Diagnosis not present

## 2017-05-29 DIAGNOSIS — Z0000 Encounter for general adult medical examination without abnormal findings: Secondary | ICD-10-CM | POA: Diagnosis not present

## 2017-05-29 DIAGNOSIS — D692 Other nonthrombocytopenic purpura: Secondary | ICD-10-CM | POA: Diagnosis not present

## 2017-06-09 DIAGNOSIS — M810 Age-related osteoporosis without current pathological fracture: Secondary | ICD-10-CM | POA: Diagnosis not present

## 2017-06-23 DIAGNOSIS — I10 Essential (primary) hypertension: Secondary | ICD-10-CM | POA: Diagnosis not present

## 2017-06-30 DIAGNOSIS — E784 Other hyperlipidemia: Secondary | ICD-10-CM | POA: Diagnosis not present

## 2017-06-30 DIAGNOSIS — I77811 Abdominal aortic ectasia: Secondary | ICD-10-CM | POA: Diagnosis not present

## 2017-06-30 DIAGNOSIS — D696 Thrombocytopenia, unspecified: Secondary | ICD-10-CM | POA: Diagnosis not present

## 2017-06-30 DIAGNOSIS — N401 Enlarged prostate with lower urinary tract symptoms: Secondary | ICD-10-CM | POA: Diagnosis not present

## 2017-06-30 DIAGNOSIS — I5022 Chronic systolic (congestive) heart failure: Secondary | ICD-10-CM | POA: Diagnosis not present

## 2017-06-30 DIAGNOSIS — I25118 Atherosclerotic heart disease of native coronary artery with other forms of angina pectoris: Secondary | ICD-10-CM | POA: Diagnosis not present

## 2017-06-30 DIAGNOSIS — E559 Vitamin D deficiency, unspecified: Secondary | ICD-10-CM | POA: Diagnosis not present

## 2017-06-30 DIAGNOSIS — I10 Essential (primary) hypertension: Secondary | ICD-10-CM | POA: Diagnosis not present

## 2017-06-30 DIAGNOSIS — E538 Deficiency of other specified B group vitamins: Secondary | ICD-10-CM | POA: Diagnosis not present

## 2017-06-30 DIAGNOSIS — N183 Chronic kidney disease, stage 3 (moderate): Secondary | ICD-10-CM | POA: Diagnosis not present

## 2017-06-30 DIAGNOSIS — M810 Age-related osteoporosis without current pathological fracture: Secondary | ICD-10-CM | POA: Diagnosis not present

## 2017-06-30 DIAGNOSIS — D692 Other nonthrombocytopenic purpura: Secondary | ICD-10-CM | POA: Diagnosis not present

## 2017-07-12 DIAGNOSIS — E875 Hyperkalemia: Secondary | ICD-10-CM | POA: Diagnosis not present

## 2017-08-01 DIAGNOSIS — E559 Vitamin D deficiency, unspecified: Secondary | ICD-10-CM | POA: Diagnosis not present

## 2017-08-01 DIAGNOSIS — D692 Other nonthrombocytopenic purpura: Secondary | ICD-10-CM | POA: Diagnosis not present

## 2017-08-01 DIAGNOSIS — D696 Thrombocytopenia, unspecified: Secondary | ICD-10-CM | POA: Diagnosis not present

## 2017-08-01 DIAGNOSIS — L719 Rosacea, unspecified: Secondary | ICD-10-CM | POA: Diagnosis not present

## 2017-08-01 DIAGNOSIS — E784 Other hyperlipidemia: Secondary | ICD-10-CM | POA: Diagnosis not present

## 2017-08-01 DIAGNOSIS — I77811 Abdominal aortic ectasia: Secondary | ICD-10-CM | POA: Diagnosis not present

## 2017-08-01 DIAGNOSIS — I5022 Chronic systolic (congestive) heart failure: Secondary | ICD-10-CM | POA: Diagnosis not present

## 2017-08-01 DIAGNOSIS — I25118 Atherosclerotic heart disease of native coronary artery with other forms of angina pectoris: Secondary | ICD-10-CM | POA: Diagnosis not present

## 2017-08-01 DIAGNOSIS — N183 Chronic kidney disease, stage 3 (moderate): Secondary | ICD-10-CM | POA: Diagnosis not present

## 2017-08-01 DIAGNOSIS — M810 Age-related osteoporosis without current pathological fracture: Secondary | ICD-10-CM | POA: Diagnosis not present

## 2017-08-01 DIAGNOSIS — N401 Enlarged prostate with lower urinary tract symptoms: Secondary | ICD-10-CM | POA: Diagnosis not present

## 2017-08-01 DIAGNOSIS — I10 Essential (primary) hypertension: Secondary | ICD-10-CM | POA: Diagnosis not present

## 2017-09-20 DIAGNOSIS — H2513 Age-related nuclear cataract, bilateral: Secondary | ICD-10-CM | POA: Diagnosis not present

## 2017-10-25 DIAGNOSIS — M810 Age-related osteoporosis without current pathological fracture: Secondary | ICD-10-CM | POA: Diagnosis not present

## 2017-10-25 DIAGNOSIS — E538 Deficiency of other specified B group vitamins: Secondary | ICD-10-CM | POA: Diagnosis not present

## 2017-10-25 DIAGNOSIS — I77811 Abdominal aortic ectasia: Secondary | ICD-10-CM | POA: Diagnosis not present

## 2017-10-25 DIAGNOSIS — E559 Vitamin D deficiency, unspecified: Secondary | ICD-10-CM | POA: Diagnosis not present

## 2017-10-25 DIAGNOSIS — E7849 Other hyperlipidemia: Secondary | ICD-10-CM | POA: Diagnosis not present

## 2017-10-25 DIAGNOSIS — N183 Chronic kidney disease, stage 3 (moderate): Secondary | ICD-10-CM | POA: Diagnosis not present

## 2017-10-25 DIAGNOSIS — I10 Essential (primary) hypertension: Secondary | ICD-10-CM | POA: Diagnosis not present

## 2017-10-25 DIAGNOSIS — I25118 Atherosclerotic heart disease of native coronary artery with other forms of angina pectoris: Secondary | ICD-10-CM | POA: Diagnosis not present

## 2017-11-01 DIAGNOSIS — I25118 Atherosclerotic heart disease of native coronary artery with other forms of angina pectoris: Secondary | ICD-10-CM | POA: Diagnosis not present

## 2017-11-01 DIAGNOSIS — D692 Other nonthrombocytopenic purpura: Secondary | ICD-10-CM | POA: Diagnosis not present

## 2017-11-01 DIAGNOSIS — E7849 Other hyperlipidemia: Secondary | ICD-10-CM | POA: Diagnosis not present

## 2017-11-01 DIAGNOSIS — N183 Chronic kidney disease, stage 3 (moderate): Secondary | ICD-10-CM | POA: Diagnosis not present

## 2017-11-01 DIAGNOSIS — E538 Deficiency of other specified B group vitamins: Secondary | ICD-10-CM | POA: Diagnosis not present

## 2017-11-01 DIAGNOSIS — I5022 Chronic systolic (congestive) heart failure: Secondary | ICD-10-CM | POA: Diagnosis not present

## 2017-11-01 DIAGNOSIS — M810 Age-related osteoporosis without current pathological fracture: Secondary | ICD-10-CM | POA: Diagnosis not present

## 2017-11-01 DIAGNOSIS — N401 Enlarged prostate with lower urinary tract symptoms: Secondary | ICD-10-CM | POA: Diagnosis not present

## 2017-11-01 DIAGNOSIS — I10 Essential (primary) hypertension: Secondary | ICD-10-CM | POA: Diagnosis not present

## 2017-11-01 DIAGNOSIS — I77811 Abdominal aortic ectasia: Secondary | ICD-10-CM | POA: Diagnosis not present

## 2017-11-01 DIAGNOSIS — D696 Thrombocytopenia, unspecified: Secondary | ICD-10-CM | POA: Diagnosis not present

## 2017-11-01 DIAGNOSIS — E559 Vitamin D deficiency, unspecified: Secondary | ICD-10-CM | POA: Diagnosis not present

## 2018-03-14 DIAGNOSIS — L820 Inflamed seborrheic keratosis: Secondary | ICD-10-CM | POA: Diagnosis not present

## 2018-03-14 DIAGNOSIS — L821 Other seborrheic keratosis: Secondary | ICD-10-CM | POA: Diagnosis not present

## 2018-03-14 DIAGNOSIS — D225 Melanocytic nevi of trunk: Secondary | ICD-10-CM | POA: Diagnosis not present

## 2018-03-14 DIAGNOSIS — X32XXXD Exposure to sunlight, subsequent encounter: Secondary | ICD-10-CM | POA: Diagnosis not present

## 2018-03-14 DIAGNOSIS — L570 Actinic keratosis: Secondary | ICD-10-CM | POA: Diagnosis not present

## 2018-03-21 DIAGNOSIS — B0052 Herpesviral keratitis: Secondary | ICD-10-CM | POA: Diagnosis not present

## 2018-04-01 DIAGNOSIS — M25562 Pain in left knee: Secondary | ICD-10-CM | POA: Diagnosis not present

## 2018-04-03 DIAGNOSIS — M25562 Pain in left knee: Secondary | ICD-10-CM | POA: Diagnosis not present

## 2018-04-05 DIAGNOSIS — M25562 Pain in left knee: Secondary | ICD-10-CM | POA: Diagnosis not present

## 2018-04-26 DIAGNOSIS — M25462 Effusion, left knee: Secondary | ICD-10-CM | POA: Diagnosis not present

## 2018-05-08 DIAGNOSIS — M25462 Effusion, left knee: Secondary | ICD-10-CM | POA: Diagnosis not present

## 2018-05-09 ENCOUNTER — Telehealth: Payer: Self-pay | Admitting: Cardiovascular Disease

## 2018-05-09 NOTE — Telephone Encounter (Signed)
° °  [facility] Pre-operative Risk Assessment    Request for surgical clearance:  1. What type of surgery is being performed?  Left Total Knee Replacement   2. When is this surgery scheduled? Pending clearance   3. What type of clearance is required (medical clearance vs. Pharmacy clearance to hold med vs. Both)? Both   4. Are there any medications that need to be held prior to surgery and how long? Please provide protocol for Plavix xarelto asa coumadin eliquis other    5. Practice name and name of physician performing surgery?  [facility]   6. What is your office phone number [phone]   7.   What is your office fax number [phone]  8.   Anesthesia type (None, local, MAC, general) ? Spinal    Clarisse Gouge 05/09/2018, 1:07 PM  _________________________________________________________________   (provider comments below)

## 2018-05-11 NOTE — Telephone Encounter (Signed)
Patient has appointment scheduled for 06/07/18.

## 2018-05-11 NOTE — Telephone Encounter (Signed)
Can you review the chart looks like last visit 1 year ago Needs a visit with any provider for clearance

## 2018-05-23 DIAGNOSIS — R739 Hyperglycemia, unspecified: Secondary | ICD-10-CM | POA: Diagnosis not present

## 2018-05-23 DIAGNOSIS — E7849 Other hyperlipidemia: Secondary | ICD-10-CM | POA: Diagnosis not present

## 2018-05-23 DIAGNOSIS — E538 Deficiency of other specified B group vitamins: Secondary | ICD-10-CM | POA: Diagnosis not present

## 2018-05-23 DIAGNOSIS — D696 Thrombocytopenia, unspecified: Secondary | ICD-10-CM | POA: Diagnosis not present

## 2018-05-23 DIAGNOSIS — I25118 Atherosclerotic heart disease of native coronary artery with other forms of angina pectoris: Secondary | ICD-10-CM | POA: Diagnosis not present

## 2018-05-23 DIAGNOSIS — I77811 Abdominal aortic ectasia: Secondary | ICD-10-CM | POA: Diagnosis not present

## 2018-05-23 DIAGNOSIS — I10 Essential (primary) hypertension: Secondary | ICD-10-CM | POA: Diagnosis not present

## 2018-05-30 DIAGNOSIS — E559 Vitamin D deficiency, unspecified: Secondary | ICD-10-CM | POA: Diagnosis not present

## 2018-05-30 DIAGNOSIS — I5022 Chronic systolic (congestive) heart failure: Secondary | ICD-10-CM | POA: Diagnosis not present

## 2018-05-30 DIAGNOSIS — R739 Hyperglycemia, unspecified: Secondary | ICD-10-CM | POA: Diagnosis not present

## 2018-05-30 DIAGNOSIS — D692 Other nonthrombocytopenic purpura: Secondary | ICD-10-CM | POA: Diagnosis not present

## 2018-05-30 DIAGNOSIS — E538 Deficiency of other specified B group vitamins: Secondary | ICD-10-CM | POA: Diagnosis not present

## 2018-05-30 DIAGNOSIS — M810 Age-related osteoporosis without current pathological fracture: Secondary | ICD-10-CM | POA: Diagnosis not present

## 2018-05-30 DIAGNOSIS — I77811 Abdominal aortic ectasia: Secondary | ICD-10-CM | POA: Diagnosis not present

## 2018-05-30 DIAGNOSIS — Z0000 Encounter for general adult medical examination without abnormal findings: Secondary | ICD-10-CM | POA: Diagnosis not present

## 2018-05-30 DIAGNOSIS — N183 Chronic kidney disease, stage 3 (moderate): Secondary | ICD-10-CM | POA: Diagnosis not present

## 2018-05-30 DIAGNOSIS — I25118 Atherosclerotic heart disease of native coronary artery with other forms of angina pectoris: Secondary | ICD-10-CM | POA: Diagnosis not present

## 2018-05-30 DIAGNOSIS — E7849 Other hyperlipidemia: Secondary | ICD-10-CM | POA: Diagnosis not present

## 2018-05-30 DIAGNOSIS — D696 Thrombocytopenia, unspecified: Secondary | ICD-10-CM | POA: Diagnosis not present

## 2018-05-30 DIAGNOSIS — I10 Essential (primary) hypertension: Secondary | ICD-10-CM | POA: Diagnosis not present

## 2018-06-06 NOTE — Progress Notes (Signed)
Cardiology Office Note  Date:  06/07/2018   ID:  [name] [name], DOB [birth year], MRN [id number]  PCP:  [name] Hector, MD   Chief Complaint  Patient presents with  . OTHER    Cardiac clearance c/o veins left leg. Meds reviewed verbally with pt.    HPI:  [name] [name] is a pleasant 84 year old gentleman with a history of  Smoking, stopped in 1969 coronary artery disease,  prior stenting to his mid LAD,  in 2006, anterior ST elevation MI December 2008 secondary to ISR hypertension,  hyperlipidemia,  chronic kidney disease admitted to the hospital October 21, 2011 with chest pain cardiac cardiac catheterization  with PCI of the OM 2. He presents today for follow-up of his coronary artery disease  Needs left knee replacement on the left Planned for July 2019, possibly Dr. Mayer [name], [name]  Denies any shortness of breath or chest pain on exertion No symptoms concerning for angina Reports that he was pushing a lawnmower up [name] [name] trailer and 30 minutes later developed severe left knee pain  Weight down 15 to 20 pound in one year Has been restricting his diet  Lab work reviewed with him Total chol 119, LDL 61  Previously wanted to stay on plavix and aspirin Concerned that when he stopped Plavix in the past he had stent thrombosis, this was back in 2008  Worried about the veins in his left lower extremity No regular exercise program  EKG personally reviewed by myself on todays visit Shows sinus bradycardia rate 53 bpm left bundle branch block, nonspecific ST abnormality  Other past medical history reviewed hospital admission 08/04/2015 for GI discomfort, chest pain Negative cardiac enzymes, echo as below Acute on chronic bronchitis, treated with ABX He developed a diffuse rash felt secondary to the penicillin based ABX Medication was held and rash resolved. ACE inhibitor was held and changed to losartan, cough is better. referred to GI by primary care for  abdominal discomfort. Has been taking PPI with improved symptoms.  Echocardiogram 08/04/2015 showing ejection fraction 45-50% with apical hypokinesis Stress test at the end of August 2016 showing fixed defect, no ischemia, old anterior MI   chronic kidney disease, stage III, asymptomatic bradycardia, obesity, right hip replacement 2008, thrombocytopenia, anterior ST elevation MI December 2008 secondary to stent thrombosis in the setting of stopping Plavix treated with bare-metal stenting. Prior DES stent to the LAD in 2006  Prior stress test 01/10/2013 that showed Old scar and the mid to distal anterior wall, apical region.  Previous creatinine 1.9.  Prior cardiac catheterization October 2012 showed left main shows no disease, LAD has 30% proximal disease, 2 overlapping stents in the mid region, 95% ostial diagonal disease jailed by the stents, 95% proximal OM 2 disease, mild plaque in the RCA, hypokinesis of the anterolateral wall, apical region and inferoapical wall with ejection fraction 30-35%.  Lab work from June 2014 shows total cholesterol 94, LDL 42, HDL 28 Lab work December 2014 shows total cholesterol 120  Echocardiogram October 20, 2011 shows ejection fraction 45%, apical hypokinesis, mild LVH, mildly dilated left atrium   PMH:   has a past medical history of Arthritis, CAD (coronary artery disease) (10/21/2011), Hyperlipidemia, Hypertension, Myocardial infarction [facility]) (2008 & 2013), Renal insufficiency, mild, Sinus bradycardia, and Tobacco user.  PSH:    Past Surgical History:  Procedure Laterality Date  . CARDIAC CATHETERIZATION  10/21/2011   s/p stent placement  . CARDIAC CATHETERIZATION  2008   stent placement  . CARDIAC  CATHETERIZATION  2006   stent placement  . CARPAL TUNNEL RELEASE Right 07/22/2016   Procedure: CARPAL TUNNEL RELEASE;  Surgeon: [name] Kail, MD;  Location: [facility] ORS;  Service: Orthopedics;  Laterality: Right;  . TOTAL HIP ARTHROPLASTY      Right  . TRANSURETHRAL RESECTION OF PROSTATE      Current Outpatient Medications  Medication Sig Dispense Refill  . acetaminophen-codeine (TYLENOL #3) 300-30 MG tablet TAKE 1 TO 2 TABLETS BY MOUTH EVERY 6 TO 8 HOURS AS NEEDED FOR PAIN  0  . aspirin EC 81 MG EC tablet Take 1 tablet (81 mg total) by mouth daily. 30 tablet 0  . carvedilol (COREG) 3.125 MG tablet Take 3.125 mg by mouth 2 (two) times daily with a meal.    . clopidogrel (PLAVIX) 75 MG tablet Take 75 mg by mouth daily.      . furosemide (LASIX) 40 MG tablet Take 40 mg by mouth as needed.     Marland Kitchen losartan (COZAAR) 50 MG tablet Take 50 mg by mouth daily.    Marland Kitchen lovastatin (MEVACOR) 40 MG tablet Take 40 mg by mouth at bedtime.    . nitroGLYCERIN (NITROSTAT) 0.4 MG SL tablet Place 0.4 mg under the tongue every 5 (five) minutes as needed for chest pain.     No current facility-administered medications for this visit.      Allergies:   Amoxicillin   Social History:  The patient  reports that he quit smoking about 49 years ago. His smoking use included cigarettes. He has a 20.00 pack-year smoking history. He has never used smokeless tobacco. He reports that he does not drink alcohol or use drugs.   Family History:   Family history is unknown by patient.    Review of Systems: Review of Systems  Constitutional: Negative.   Respiratory: Positive for shortness of breath.   Cardiovascular: Negative.   Gastrointestinal: Negative.   Musculoskeletal: Negative.   Neurological: Negative.   Psychiatric/Behavioral: Negative.   All other systems reviewed and are negative.    PHYSICAL EXAM: VS:  BP (!) 142/70 (BP Location: Left Arm, Patient Position: Sitting, Cuff Size: Normal)   Pulse (!) 53   Ht 5\' 6"  (1.676 m)   Wt 205 lb 4 oz (93.1 kg)   BMI 33.13 kg/m  , BMI Body mass index is 33.13 kg/m. Constitutional:  oriented to person, place, and time. No distress.  Difficulty getting down from the exam table secondary to knee pain HENT:   Head: Normocephalic and atraumatic.  Eyes:  no discharge. No scleral icterus.  Neck: Normal range of motion. Neck supple. No JVD present.  Cardiovascular: Normal rate, regular rhythm, normal heart sounds and intact distal pulses. Exam reveals no gallop and no friction rub. No edema Varicosities noted left leg greater than right No murmur heard. Pulmonary/Chest: Effort normal and breath sounds normal. No stridor. No respiratory distress.  no wheezes.  no rales.  no tenderness.  Abdominal: Soft.  no distension.  no tenderness.  Musculoskeletal: Normal range of motion.  no  tenderness or deformity.  Neurological:  normal muscle tone. Coordination normal. No atrophy Skin: Skin is warm and dry. No rash noted. not diaphoretic.  Psychiatric:  normal mood and affect. behavior is normal. Thought content normal.      Recent Labs: No results found for requested labs within last 8760 hours.    Lipid Panel Lab Results  Component Value Date   CHOL 156 10/21/2011   HDL 27 (L) 10/21/2011  [place] 95 10/21/2011   TRIG 172 (H) 10/21/2011      Wt Readings from Last 3 Encounters:  06/07/18 205 lb 4 oz (93.1 kg)  05/01/17 220 lb (99.8 kg)  07/22/16 215 lb (97.5 kg)       ASSESSMENT AND PLAN:  Atherosclerosis of native coronary artery with stable angina pectoris, unspecified whether native or transplanted heart ([place]) - Plan: EKG 12-Lead Currently with no symptoms of angina. No further workup at this time. Continue current medication regimen.  HYPERTENSION, BENIGN - Plan: EKG 12-Lead Blood pressure is well controlled on today's visit. No changes made to the medications. Mildly bradycardic but is asymptomatic If he does get symptomatic we would  hold the evening carvedilol  Mixed hyperlipidemia - Plan: EKG 12-Lead Cholesterol is at goal on the current lipid regimen. No changes to the medications were made.stable  Chronic renal failure, stage 3 (moderate) - Plan: EKG 12-Lead Stable  renal function  avoid NSAIDs  Shortness of breath Denies any significant symptoms on today's visit,  likely improved with 15-20 pound weight loss.no further workup at this time   Total encounter time more than 25 minutes  Greater than 50% was spent in counseling and coordination of care with the patient   Disposition:   F/U  12 months   Orders Placed This Encounter  Procedures  . EKG 12-Lead     Signed, Esmond Plants, M.D., Ph.D. 06/07/2018  [place], [place]

## 2018-06-07 ENCOUNTER — Ambulatory Visit: Payer: PPO | Admitting: Cardiovascular Disease

## 2018-06-07 ENCOUNTER — Encounter: Payer: Self-pay | Admitting: Cardiovascular Disease

## 2018-06-07 ENCOUNTER — Encounter

## 2018-06-07 VITALS — BP 142/70 | HR 53 | Ht 66.0 in | Wt 205.2 lb

## 2018-06-07 DIAGNOSIS — I10 Essential (primary) hypertension: Secondary | ICD-10-CM

## 2018-06-07 DIAGNOSIS — N183 Chronic kidney disease, stage 3 unspecified: Secondary | ICD-10-CM

## 2018-06-07 DIAGNOSIS — R0602 Shortness of breath: Secondary | ICD-10-CM | POA: Diagnosis not present

## 2018-06-07 DIAGNOSIS — E782 Mixed hyperlipidemia: Secondary | ICD-10-CM | POA: Diagnosis not present

## 2018-06-07 DIAGNOSIS — I25118 Atherosclerotic heart disease of native coronary artery with other forms of angina pectoris: Secondary | ICD-10-CM | POA: Diagnosis not present

## 2018-06-07 NOTE — Patient Instructions (Addendum)
Medication Instructions:   Stop the plavix 5 days before knee surgery Stay on asa 81 daily After the surgery, restart the plavix (hold the asa)   Labwork:  No new labs needed  Testing/Procedures:  No further testing at this time   Follow-Up: It was a pleasure seeing you in the office today. Please call us if you have new issues that need to be addressed before your next appt.  [phone]  Your physician wants you to follow-up in: 12 months.  You will receive a reminder letter in the mail two months in advance. If you don't receive a letter, please call our office to schedule the follow-up appointment.  If you need a refill on your cardiac medications before your next appointment, please call your pharmacy.  For educational health videos Log in to : [email or web address] Or : [email or web address], password : [email or web address]

## 2018-06-08 ENCOUNTER — Other Ambulatory Visit: Payer: Self-pay | Admitting: Orthopedic Surgery

## 2018-06-19 ENCOUNTER — Other Ambulatory Visit: Payer: Self-pay | Admitting: Orthopedic Surgery

## 2018-06-19 NOTE — Pre-Procedure Instructions (Signed)
JOSIA CUEVA  06/19/2018      [facility] - [place], [place] - [address] [place] [address] Phone: [phone] Fax: [phone]    Your procedure is scheduled on Fri., June 29, 2018 from 9:30AM-11:42AM  Report to [facility] Admitting Entrance "A" at 7:30AM  Call this number if you have problems the morning of surgery:  [phone]   Remember:  Do not eat or drink after midnight on July 3rd    Take these medicines the morning of surgery with A SIP OF WATER: Carvedilol (COREG)  If needed NitroGLYCERIN (NITROSTAT) (Notify the nurse if you had to use this medicine)  Follow your doctors instructions regarding your Aspirin and Plavix.  If no instructions were given by your doctor, then you will need to call the prescribing office office to get instructions.    7 days before surgery (6/28), stop taking all Other Aspirin Products, Vitamins, Fish oils, and Herbal medications. Also stop all NSAIDS i.e. Advil, Ibuprofen, Motrin, Aleve, Anaprox, Naproxen, BC, Goody Powders, and all Supplements.    Do not wear jewelry.  Do not wear lotions, powders, colognes, or deodorant.  Do not shave 48 hours prior to surgery.  Men may shave face.  Do not bring valuables to the hospital.  [facility] is not responsible for any belongings or valuables.  Contacts, dentures or bridgework may not be worn into surgery.  Leave your suitcase in the car.  After surgery it may be brought to your room.  For patients admitted to the hospital, discharge time will be determined by your treatment team.  Patients discharged the day of surgery will not be allowed to drive home.   Special instructions:   [facility]- Preparing For Surgery  Before surgery, you can play an important role. Because skin is not sterile, your skin needs to be as free of germs as possible. You can reduce the number of germs on your skin by washing with CHG (chlorahexidine gluconate)  Soap before surgery.  CHG is an antiseptic cleaner which kills germs and bonds with the skin to continue killing germs even after washing.    Oral Hygiene is also important to reduce your risk of infection.  Remember - BRUSH YOUR TEETH THE MORNING OF SURGERY WITH YOUR REGULAR TOOTHPASTE  Please do not use if you have an allergy to CHG or antibacterial soaps. If your skin becomes reddened/irritated stop using the CHG.  Do not shave (including legs and underarms) for at least 48 hours prior to first CHG shower. It is OK to shave your face.  Please follow these instructions carefully.   1. Shower the NIGHT BEFORE SURGERY and the MORNING OF SURGERY with CHG.   2. If you chose to wash your hair, wash your hair first as usual with your normal shampoo.  3. After you shampoo, rinse your hair and body thoroughly to remove the shampoo.  4. Use CHG as you would any other liquid soap. You can apply CHG directly to the skin and wash gently with a scrungie or a clean washcloth.   5. Apply the CHG Soap to your body ONLY FROM THE NECK DOWN.  Do not use on open wounds or open sores. Avoid contact with your eyes, ears, mouth and genitals (private parts). Wash Face and genitals (private parts)  with your normal soap.  6. Wash thoroughly, paying special attention to the area where your surgery will be performed.  7. Thoroughly rinse your  body with warm water from the neck down.  8. DO NOT shower/wash with your normal soap after using and rinsing off the CHG Soap.  9. Pat yourself dry with a CLEAN TOWEL.  10. Wear CLEAN PAJAMAS to bed the night before surgery, wear comfortable clothes the morning of surgery  11. Place CLEAN SHEETS on your bed the night of your first shower and DO NOT SLEEP WITH PETS.  Day of Surgery:  Do not apply any deodorants/lotions.  Please wear clean clothes to the hospital/surgery center.   Remember to brush your teeth WITH YOUR REGULAR TOOTHPASTE.  Please read over the  following fact sheets that you were given. Pain Booklet, Coughing and Deep Breathing, MRSA Information and Surgical Site Infection Prevention

## 2018-06-20 ENCOUNTER — Ambulatory Visit (HOSPITAL_COMMUNITY)
Admission: RE | Admit: 2018-06-20 | Discharge: 2018-06-20 | Disposition: A | Discharge status: Home / Self Care | Payer: PPO | Source: Ambulatory Visit | Attending: Orthopedic Surgery | Admitting: Orthopedic Surgery

## 2018-06-20 ENCOUNTER — Encounter (HOSPITAL_COMMUNITY)
Admission: RE | Admit: 2018-06-20 | Discharge: 2018-06-20 | Disposition: A | Discharge status: Home / Self Care | Payer: PPO | Source: Ambulatory Visit | Attending: Orthopedic Surgery | Admitting: Orthopedic Surgery

## 2018-06-20 ENCOUNTER — Encounter (HOSPITAL_COMMUNITY): Payer: Self-pay

## 2018-06-20 DIAGNOSIS — I10 Essential (primary) hypertension: Secondary | ICD-10-CM | POA: Insufficient documentation

## 2018-06-20 DIAGNOSIS — E785 Hyperlipidemia, unspecified: Secondary | ICD-10-CM | POA: Insufficient documentation

## 2018-06-20 DIAGNOSIS — I2510 Atherosclerotic heart disease of native coronary artery without angina pectoris: Secondary | ICD-10-CM | POA: Insufficient documentation

## 2018-06-20 DIAGNOSIS — I252 Old myocardial infarction: Secondary | ICD-10-CM | POA: Insufficient documentation

## 2018-06-20 DIAGNOSIS — Z01812 Encounter for preprocedural laboratory examination: Secondary | ICD-10-CM | POA: Insufficient documentation

## 2018-06-20 DIAGNOSIS — R0602 Shortness of breath: Secondary | ICD-10-CM | POA: Diagnosis not present

## 2018-06-20 DIAGNOSIS — Z01818 Encounter for other preprocedural examination: Secondary | ICD-10-CM | POA: Diagnosis not present

## 2018-06-20 HISTORY — DX: Dyspnea, unspecified: R06.00

## 2018-06-20 LAB — CBC WITH DIFFERENTIAL/PLATELET
ABS IMMATURE GRANULOCYTES: 0 10*3/uL (ref 0.0–0.1)
Basophils Absolute: 0.1 10*3/uL (ref 0.0–0.1)
Basophils Relative: 1 %
EOS ABS: 0.2 10*3/uL (ref 0.0–0.7)
Eosinophils Relative: 4 %
HEMATOCRIT: 48.3 % (ref 39.0–52.0)
HEMOGLOBIN: 15.3 g/dL (ref 13.0–17.0)
IMMATURE GRANULOCYTES: 0 %
LYMPHS ABS: 2.1 10*3/uL (ref 0.7–4.0)
LYMPHS PCT: 31 %
MCH: 32.9 pg (ref 26.0–34.0)
MCHC: 31.7 g/dL (ref 30.0–36.0)
MCV: 103.9 fL — ABNORMAL HIGH (ref 78.0–100.0)
Monocytes Absolute: 0.8 10*3/uL (ref 0.1–1.0)
Monocytes Relative: 12 %
NEUTROS ABS: 3.5 10*3/uL (ref 1.7–7.7)
NEUTROS PCT: 52 %
Platelets: 152 10*3/uL (ref 150–400)
RBC: 4.65 MIL/uL (ref 4.22–5.81)
RDW: 14.2 % (ref 11.5–15.5)
WBC: 6.7 10*3/uL (ref 4.0–10.5)

## 2018-06-20 LAB — URINALYSIS, ROUTINE W REFLEX MICROSCOPIC
BILIRUBIN URINE: NEGATIVE
Glucose, UA: NEGATIVE mg/dL
KETONES UR: NEGATIVE mg/dL
Leukocytes, UA: NEGATIVE
Nitrite: NEGATIVE
PH: 5 (ref 5.0–8.0)
Protein, ur: NEGATIVE mg/dL
Specific Gravity, Urine: 1.015 (ref 1.005–1.030)

## 2018-06-20 LAB — TYPE AND SCREEN
ABO/RH(D): A POS
ANTIBODY SCREEN: NEGATIVE

## 2018-06-20 LAB — PROTIME-INR
INR: 1.08
Prothrombin Time: 13.9 seconds (ref 11.4–15.2)

## 2018-06-20 LAB — APTT: aPTT: 30 seconds (ref 24–36)

## 2018-06-20 LAB — BASIC METABOLIC PANEL
ANION GAP: 8 (ref 5–15)
BUN: 26 mg/dL — ABNORMAL HIGH (ref 8–23)
CHLORIDE: 106 mmol/L (ref 98–111)
CO2: 25 mmol/L (ref 22–32)
Calcium: 9.4 mg/dL (ref 8.9–10.3)
Creatinine, Ser: 1.78 mg/dL — ABNORMAL HIGH (ref 0.61–1.24)
GFR calc non Af Amer: 33 mL/min — ABNORMAL LOW (ref >60)
GFR, EST AFRICAN AMERICAN: 38 mL/min — AB (ref >60)
Glucose, Bld: 85 mg/dL (ref 70–99)
Potassium: 4.7 mmol/L (ref 3.5–5.1)
SODIUM: 139 mmol/L (ref 135–145)

## 2018-06-20 LAB — SURGICAL PCR SCREEN
MRSA, PCR: NEGATIVE
STAPHYLOCOCCUS AUREUS: NEGATIVE

## 2018-06-20 NOTE — Progress Notes (Signed)
Pt called stating pre-op instructions were to stop eating July 3rd per instructions given by RN today. Pt surgery on the 5th. Pt educated that he cannot have anything to eat or drink after midnight the night before surgery.

## 2018-06-21 NOTE — Telephone Encounter (Signed)
To Dr. Rockey Situ Dr. Fletcher Anon (covering) to review additional plavix request.

## 2018-06-21 NOTE — Progress Notes (Signed)
Anesthesia Chart Review:   Case:  [id number] Date/Time:  06/29/18 0915   Procedure:  LEFT TOTAL KNEE ARTHROPLASTY (Left Knee)   Anesthesia type:  Spinal   Pre-op diagnosis:  LEFT KNEE OSTEOARTHRITIS   Location:  [facility] ROOM 06 / [place] [facility]   Surgeon:  Frederik Pear, MD      DISCUSSION: - Pt is an 84 year old male with hx CAD (prior stent to LAD 2006; PCI to [place] 2012), CKD (stage III)  - Pt to continue ASA perioperatively, stop plavix 5 days prior to surgery.  - I reached out to Dr. Rockey Situ about stopping plavix 7 days prior to surgery and received ok to do this.  Heather in Dr. Donivan Scull office left message for pt about stopping plavix 7 days prior to surgery 06/22/18.  If pt has already taken plavix today (6/28), he will likely only be off of it 6 days prior to surgery.     VS: BP (!) 142/56   Pulse (!) 56   Temp 36.9 C   Resp 20   Ht 5\' 6"  (1.676 m)   Wt 206 lb 8 oz (93.7 kg)   SpO2 98%   BMI 33.33 kg/m    PROVIDERS:  PCP is  [name], MD Cardiologist is Ida Rogue, MD   LABS:  - Cr 1.78, BUN 26. Prior recent Cr results range 1.6-2.0 (see care everywhere). Pt has hx CKD.    (all labs ordered are listed, but only abnormal results are displayed)  Labs Reviewed  BASIC METABOLIC PANEL - Abnormal; Notable for the following components:      Result Value   BUN 26 (*)    Creatinine, Ser 1.78 (*)    GFR calc non Af Amer 33 (*)    GFR calc Af Amer 38 (*)    All other components within normal limits  CBC WITH DIFFERENTIAL/PLATELET - Abnormal; Notable for the following components:   MCV 103.9 (*)    All other components within normal limits  URINALYSIS, ROUTINE W REFLEX MICROSCOPIC - Abnormal; Notable for the following components:   Hgb urine dipstick SMALL (*)    Bacteria, UA RARE (*)    All other components within normal limits  SURGICAL PCR SCREEN  APTT  PROTIME-INR  TYPE AND SCREEN    IMAGES:  CXR 06/20/18: No active cardiopulmonary disease.   EKG 06/07/18:   Sinus bradycardia (53 bpm). LAD. LBBB   CV:  Nuclear stress test 08/11/15:   Defect 1: There is a medium defect present in the apical anterior, apical septal and apex location.  Findings consistent with prior myocardial infarction.  This is an intermediate risk study.  The left ventricular ejection fraction is severely decreased (<30%).  Nuclear stress EF: 29%.   Echo 08/04/15:  - Left ventricle: The cavity size was normal. Systolic function was mildly reduced. The estimated ejection fraction was in the range of 45% to 50%. There is akinesis of the distal septal and apical myocardium. There is hypokinesis of the mid anteroseptal and apical anterior myocardium. There was an increased relative contribution of atrial contraction to ventricular filling.   Doppler parameters are consistent with abnormal left ventricular relaxation (grade 1 diastolic dysfunction). - Aortic valve: There was mild regurgitation   Cardiac cath 10/21/11:  1. LM: free of disease 2. LAD: proximal 30%. Two overlapping stents mid-vessel with no restenosis. Distal vessel with mild plaque. Diagonal branch jailed by stents; 95% ostial stenosis in this branch with good flow  down the branch 3. CX: Small OM1 with proximal 95% stenosis. OM2 moderate sized. S/p DES to OM. 4. RCA: large, dominant. Mild plaque in all segments.  5. LV systolic dysfunction with hypokinesis of anterolateral wall, apex, and inferolateral wall.  EF 30-35%     Past Medical History:  Diagnosis Date  . Arthritis   . CAD (coronary artery disease) 10/21/2011   s/p stent placement  . Dyspnea   . Hyperlipidemia   . Hypertension   . Myocardial infarction [facility]) 2008 & 2013  . Renal insufficiency, mild   . Sinus bradycardia    Assymptomatic  . Tobacco user    Remote    Past Surgical History:  Procedure Laterality Date  . CARDIAC CATHETERIZATION  10/21/2011   s/p stent placement  . CARDIAC CATHETERIZATION  2008   stent placement  .  CARDIAC CATHETERIZATION  2006   stent placement  . CARPAL TUNNEL RELEASE Right 07/22/2016   Procedure: CARPAL TUNNEL RELEASE;  Surgeon: Leanor Kail, MD;  Location: [facility] ORS;  Service: Orthopedics;  Laterality: Right;  . JOINT REPLACEMENT    . TOTAL HIP ARTHROPLASTY     Right  . TRANSURETHRAL RESECTION OF PROSTATE      MEDICATIONS: . aspirin EC 81 MG EC tablet  . carvedilol (COREG) 3.125 MG tablet  . clopidogrel (PLAVIX) 75 MG tablet  . furosemide (LASIX) 40 MG tablet  . losartan (COZAAR) 50 MG tablet  . lovastatin (MEVACOR) 40 MG tablet  . nitroGLYCERIN (NITROSTAT) 0.4 MG SL tablet   No current facility-administered medications for this encounter.     If no changes, I anticipate pt can proceed with surgery as scheduled.   Willeen Cass, FNP-BC [facility]/Anesthesiology Phone: [phone] 06/22/2018 11:50 AM

## 2018-06-21 NOTE — Telephone Encounter (Signed)
Levada Dy, a Nurse Practitioner with anesthesiology is calling States that the patient has been cleared for his knee replacement surgery and withholding plavix for 5 days She needs to know, for anesthesia purposes, if he can hold plavix for 7 days Please call to discuss at [phone]

## 2018-06-22 NOTE — Telephone Encounter (Signed)
Acceptable risk for procedure, TKR He can stop plavix 7 days prior if needed, as directed by orthopedics  Following surgery, does not need to restart plavix but in the past he wanted to continue both asa and plavix Concerned that when he stopped Plavix in the past he had stent thrombosis, this was back in 2008

## 2018-06-22 NOTE — Telephone Encounter (Signed)
I called and spoke with the pre-surgical dept. I advised the nurse that per Dr. Rockey Situ, the patient could hold plavix for 7 days prior to his procedure. I advised I would call the patient and advise him of this.  I left a message on the patient home voice mail (ok per DPR) to hold plavix starting today, if he has not already taken his procedure. Otherwise, hold starting tomorrow.  I asked that he call back to confirm he received this message.

## 2018-06-27 DIAGNOSIS — M1712 Unilateral primary osteoarthritis, left knee: Secondary | ICD-10-CM | POA: Diagnosis present

## 2018-06-27 MED ORDER — BUPIVACAINE LIPOSOME 1.3 % IJ SUSP
20.0000 mL | INTRAMUSCULAR | Status: AC
  Administered 2018-06-29: 20 mL
  Filled 2018-06-27: qty 20

## 2018-06-27 MED ORDER — VANCOMYCIN HCL IN DEXTROSE 1-5 GM/200ML-% IV SOLN
1000.0000 mg | INTRAVENOUS | Status: DC
  Filled 2018-06-27: qty 200

## 2018-06-27 MED ORDER — SODIUM CHLORIDE 0.9 % IV SOLN
1000.0000 mg | INTRAVENOUS | Status: AC
  Administered 2018-06-29: 1000 mg via INTRAVENOUS
  Filled 2018-06-27: qty 1100

## 2018-06-27 MED ORDER — TRANEXAMIC ACID 1000 MG/10ML IV SOLN
2000.0000 mg | INTRAVENOUS | Status: AC
  Administered 2018-06-29: 2000 mg via TOPICAL
  Filled 2018-06-27: qty 20

## 2018-06-27 NOTE — H&P (Signed)
TOTAL KNEE ADMISSION H&P  Patient is being admitted for left total knee arthroplasty.  Subjective:  Chief Complaint:left knee pain.  HPI: [name] [name], 84 y.o. male, has a history of pain and functional disability in the left knee due to arthritis and has failed non-surgical conservative treatments for greater than 12 weeks to includeNSAID's and/or analgesics, corticosteriod injections, flexibility and strengthening excercises and activity modification.  Onset of symptoms was gradual, starting 1 years ago with gradually worsening course since that time. The patient noted no past surgery on the left knee(s).  Patient currently rates pain in the left knee(s) at 10 out of 10 with activity. Patient has night pain, worsening of pain with activity and weight bearing, pain that interferes with activities of daily living, pain with passive range of motion, crepitus and joint swelling.  Patient has evidence of joint space narrowing by imaging studies.  There is no active infection.  Patient Active Problem List   Diagnosis Date Noted  . GERD (gastroesophageal reflux disease) 09/21/2015  . Chronic renal failure, stage 3 (moderate) ([place]) 09/21/2015  . Pain in the chest   . Shortness of breath 03/25/2015  . Hyperlipidemia 10/07/2009  . HYPERTENSION, BENIGN 10/07/2009  . OLD MYOCARDIAL INFARCTION 10/07/2009  . CAD, NATIVE VESSEL 10/07/2009   Past Medical History:  Diagnosis Date  . Arthritis   . CAD (coronary artery disease) 10/21/2011   s/p stent placement  . Dyspnea   . Hyperlipidemia   . Hypertension   . Myocardial infarction [facility]) 2008 & 2013  . Renal insufficiency, mild   . Sinus bradycardia    Assymptomatic  . Tobacco user    Remote    Past Surgical History:  Procedure Laterality Date  . CARDIAC CATHETERIZATION  10/21/2011   s/p stent placement  . CARDIAC CATHETERIZATION  2008   stent placement  . CARDIAC CATHETERIZATION  2006   stent placement  . CARPAL TUNNEL RELEASE Right  07/22/2016   Procedure: CARPAL TUNNEL RELEASE;  Surgeon: Leanor Kail, MD;  Location: [facility] ORS;  Service: Orthopedics;  Laterality: Right;  . JOINT REPLACEMENT    . TOTAL HIP ARTHROPLASTY     Right  . TRANSURETHRAL RESECTION OF PROSTATE      Current Facility-Administered Medications  Medication Dose Route Frequency Provider Last Rate Last Dose  . [START ON 06/29/2018] bupivacaine liposome (EXPAREL) 1.3 % injection 266 mg  20 mL Infiltration To OR Frederik Pear, MD      . Derrill Memo ON 06/29/2018] tranexamic acid (CYKLOKAPRON) 1,000 mg in sodium chloride 0.9 % 100 mL IVPB  1,000 mg Intravenous To OR Frederik Pear, MD      . Derrill Memo ON 06/29/2018] tranexamic acid (CYKLOKAPRON) 2,000 mg in sodium chloride 0.9 % 50 mL Topical Application  7,829 mg Topical To OR Frederik Pear, MD      . Derrill Memo ON 06/29/2018] vancomycin (VANCOCIN) IVPB 1000 mg/200 mL premix  1,000 mg Intravenous To Joellyn Haff, MD       Current Outpatient Medications  Medication Sig Dispense Refill Last Dose  . aspirin EC 81 MG EC tablet Take 1 tablet (81 mg total) by mouth daily. 30 tablet 0 Taking  . carvedilol (COREG) 3.125 MG tablet Take 3.125 mg by mouth 2 (two) times daily with a meal.   Taking  . clopidogrel (PLAVIX) 75 MG tablet Take 75 mg by mouth daily.     Taking  . furosemide (LASIX) 40 MG tablet Take 40 mg by mouth daily as needed (for fluid retention.).  Taking  . losartan (COZAAR) 50 MG tablet Take 50 mg by mouth daily at 2 PM. In the afternoon   Taking  . lovastatin (MEVACOR) 40 MG tablet Take 40 mg by mouth at bedtime.   Taking  . nitroGLYCERIN (NITROSTAT) 0.4 MG SL tablet Place 0.4 mg under the tongue every 5 (five) minutes as needed for chest pain.   Taking   Allergies  Allergen Reactions  . Amoxicillin Rash    Has patient had a PCN reaction causing immediate rash, facial/tongue/throat swelling, SOB or lightheadedness with hypotension: Unknown Has patient had a PCN reaction causing severe rash involving mucus  membranes or skin necrosis: No Has patient had a PCN reaction that required hospitalization: No Has patient had a PCN reaction occurring within the last 10 years: Yes If all of the above answers are "NO", then may proceed with Cephalosporin use.     Social History   Tobacco Use  . Smoking status: Former Smoker    Packs/day: 1.00    Years: 20.00    Pack years: 20.00    Types: Cigarettes    Last attempt to quit: 12/06/1968    Years since quitting: 49.5  . Smokeless tobacco: Never Used  Substance Use Topics  . Alcohol use: No    Family History  Family history unknown: Yes     Review of Systems  Constitutional: Negative.   HENT: Negative.   Eyes: Negative.   Respiratory: Positive for shortness of breath.   Cardiovascular: Positive for leg swelling.  Gastrointestinal: Negative.   Genitourinary: Negative.   Musculoskeletal: Positive for joint pain.  Skin: Negative.   Neurological: Negative.   Endo/Heme/Allergies: Negative.   Psychiatric/Behavioral: Negative.     Objective:  Physical Exam  Constitutional: He is oriented to person, place, and time. He appears well-developed and well-nourished.  HENT:  Head: Normocephalic and atraumatic.  Eyes: Pupils are equal, round, and reactive to light.  Neck: Normal range of motion. Neck supple.  Cardiovascular: Intact distal pulses.  Respiratory: Effort normal.  Musculoskeletal: He exhibits tenderness.  Left knee once again has a 1+ effusion.  He lacks 5 of full extension.  Flexion is to about 120.  Tender along the medial joint line.  He ambulates with a limp.  Neurological: He is alert and oriented to person, place, and time.  Skin: Skin is warm and dry.  Psychiatric: He has a normal mood and affect. His behavior is normal. Judgment and thought content normal.    Vital signs in last 24 hours:    Labs:   Estimated body mass index is 33.33 kg/m as calculated from the following:   Height as of 06/20/18: 5\' 6"  (1.676 m).    Weight as of 06/20/18: 93.7 kg (206 lb 8 oz).   Imaging Review Plain radiographs demonstrate almost complete loss of articular cartilage the medial side of the left knee.   Preoperative templating of the joint replacement has been completed, documented, and submitted to the Operating Room personnel in order to optimize intra-operative equipment management.   Anticipated LOS equal to or greater than 2 midnights due to - Age 63 and older with one or more of the following:  - Obesity  - Expected need for hospital services (PT, OT, Nursing) required for safe  discharge  - Anticipated need for postoperative skilled nursing care or inpatient rehab   Assessment/Plan:  End stage arthritis, left knee   The patient history, physical examination, clinical judgment of the provider and imaging studies are consistent  with end stage degenerative joint disease of the left knee(s) and total knee arthroplasty is deemed medically necessary. The treatment options including medical management, injection therapy arthroscopy and arthroplasty were discussed at length. The risks and benefits of total knee arthroplasty were presented and reviewed. The risks due to aseptic loosening, infection, stiffness, patella tracking problems, thromboembolic complications and other imponderables were discussed. The patient acknowledged the explanation, agreed to proceed with the plan and consent was signed. Patient is being admitted for inpatient treatment for surgery, pain control, PT, OT, prophylactic antibiotics, VTE prophylaxis, progressive ambulation and ADL's and discharge planning. The patient is planning to be discharged home with home health services

## 2018-06-29 ENCOUNTER — Encounter (HOSPITAL_COMMUNITY): Payer: Self-pay | Admitting: Certified Registered Nurse Anesthetist

## 2018-06-29 ENCOUNTER — Encounter (HOSPITAL_COMMUNITY)
Admission: RE | Disposition: A | Discharge status: Home / Self Care | Payer: Self-pay | Source: Ambulatory Visit | Attending: Orthopedic Surgery

## 2018-06-29 ENCOUNTER — Observation Stay (HOSPITAL_COMMUNITY)
Admission: RE | Admit: 2018-06-29 | Discharge: 2018-06-30 | Disposition: A | Discharge status: Home / Self Care | Payer: PPO | Source: Ambulatory Visit | Attending: Orthopedic Surgery | Admitting: Orthopedic Surgery

## 2018-06-29 ENCOUNTER — Other Ambulatory Visit: Payer: Self-pay

## 2018-06-29 ENCOUNTER — Ambulatory Visit (HOSPITAL_COMMUNITY): Payer: PPO | Admitting: Anesthesiology

## 2018-06-29 ENCOUNTER — Ambulatory Visit (HOSPITAL_COMMUNITY): Payer: PPO | Admitting: Emergency Medicine

## 2018-06-29 DIAGNOSIS — Z880 Allergy status to penicillin: Secondary | ICD-10-CM | POA: Diagnosis not present

## 2018-06-29 DIAGNOSIS — G8918 Other acute postprocedural pain: Secondary | ICD-10-CM | POA: Diagnosis not present

## 2018-06-29 DIAGNOSIS — Z96652 Presence of left artificial knee joint: Secondary | ICD-10-CM

## 2018-06-29 DIAGNOSIS — N183 Chronic kidney disease, stage 3 (moderate): Secondary | ICD-10-CM | POA: Diagnosis not present

## 2018-06-29 DIAGNOSIS — I129 Hypertensive chronic kidney disease with stage 1 through stage 4 chronic kidney disease, or unspecified chronic kidney disease: Secondary | ICD-10-CM | POA: Insufficient documentation

## 2018-06-29 DIAGNOSIS — I2510 Atherosclerotic heart disease of native coronary artery without angina pectoris: Secondary | ICD-10-CM | POA: Diagnosis not present

## 2018-06-29 DIAGNOSIS — I252 Old myocardial infarction: Secondary | ICD-10-CM | POA: Diagnosis not present

## 2018-06-29 DIAGNOSIS — Z79899 Other long term (current) drug therapy: Secondary | ICD-10-CM | POA: Insufficient documentation

## 2018-06-29 DIAGNOSIS — Z955 Presence of coronary angioplasty implant and graft: Secondary | ICD-10-CM | POA: Diagnosis not present

## 2018-06-29 DIAGNOSIS — M1712 Unilateral primary osteoarthritis, left knee: Principal | ICD-10-CM | POA: Insufficient documentation

## 2018-06-29 DIAGNOSIS — Z96641 Presence of right artificial hip joint: Secondary | ICD-10-CM | POA: Diagnosis not present

## 2018-06-29 DIAGNOSIS — Z87891 Personal history of nicotine dependence: Secondary | ICD-10-CM | POA: Diagnosis not present

## 2018-06-29 HISTORY — PX: TOTAL KNEE ARTHROPLASTY: SHX125

## 2018-06-29 SURGERY — ARTHROPLASTY, KNEE, TOTAL
Anesthesia: Spinal | Site: Knee | Laterality: Left

## 2018-06-29 MED ORDER — PROPOFOL 10 MG/ML IV BOLUS
INTRAVENOUS | Status: AC
  Filled 2018-06-29: qty 20

## 2018-06-29 MED ORDER — BUPIVACAINE-EPINEPHRINE (PF) 0.25% -1:200000 IJ SOLN
INTRAMUSCULAR | Status: DC | PRN
  Administered 2018-06-29: 50 mL

## 2018-06-29 MED ORDER — DEXAMETHASONE SODIUM PHOSPHATE 10 MG/ML IJ SOLN
10.0000 mg | Freq: Once | INTRAMUSCULAR | Status: AC
  Administered 2018-06-30: 10 mg via INTRAVENOUS
  Filled 2018-06-29: qty 1

## 2018-06-29 MED ORDER — ONDANSETRON HCL 4 MG/2ML IJ SOLN
INTRAMUSCULAR | Status: DC | PRN
  Administered 2018-06-29: 4 mg via INTRAVENOUS

## 2018-06-29 MED ORDER — HYDROMORPHONE HCL 1 MG/ML IJ SOLN: 0.5000 mg | INTRAMUSCULAR | Status: DC | PRN

## 2018-06-29 MED ORDER — OXYCODONE-ACETAMINOPHEN 5-325 MG PO TABS: 1.0000 | ORAL_TABLET | ORAL | 0 refills | Status: DC | PRN

## 2018-06-29 MED ORDER — DIPHENHYDRAMINE HCL 12.5 MG/5ML PO ELIX: 12.5000 mg | ORAL_SOLUTION | ORAL | Status: DC | PRN

## 2018-06-29 MED ORDER — ONDANSETRON HCL 4 MG/2ML IJ SOLN: 4.0000 mg | Freq: Four times a day (QID) | INTRAMUSCULAR | Status: DC | PRN

## 2018-06-29 MED ORDER — ONDANSETRON HCL 4 MG/2ML IJ SOLN
INTRAMUSCULAR | Status: AC
  Filled 2018-06-29: qty 4

## 2018-06-29 MED ORDER — METOCLOPRAMIDE HCL 5 MG/ML IJ SOLN: 5.0000 mg | Freq: Three times a day (TID) | INTRAMUSCULAR | Status: DC | PRN

## 2018-06-29 MED ORDER — CLOPIDOGREL BISULFATE 75 MG PO TABS
75.0000 mg | ORAL_TABLET | Freq: Every day | ORAL | Status: DC
  Administered 2018-06-30: 75 mg via ORAL
  Filled 2018-06-29: qty 1

## 2018-06-29 MED ORDER — PANTOPRAZOLE SODIUM 40 MG PO TBEC
40.0000 mg | DELAYED_RELEASE_TABLET | Freq: Every day | ORAL | Status: DC
  Administered 2018-06-30: 40 mg via ORAL
  Filled 2018-06-29: qty 1

## 2018-06-29 MED ORDER — CLONIDINE HCL (ANALGESIA) 100 MCG/ML EP SOLN
EPIDURAL | Status: DC | PRN
  Administered 2018-06-29: 50 ug

## 2018-06-29 MED ORDER — ALUM & MAG HYDROXIDE-SIMETH 200-200-20 MG/5ML PO SUSP: 30.0000 mL | ORAL | Status: DC | PRN

## 2018-06-29 MED ORDER — METOCLOPRAMIDE HCL 5 MG PO TABS: 5.0000 mg | ORAL_TABLET | Freq: Three times a day (TID) | ORAL | Status: DC | PRN

## 2018-06-29 MED ORDER — EPHEDRINE SULFATE 50 MG/ML IJ SOLN
INTRAMUSCULAR | Status: AC
  Filled 2018-06-29: qty 1

## 2018-06-29 MED ORDER — PHENOL 1.4 % MT LIQD: 1.0000 | OROMUCOSAL | Status: DC | PRN

## 2018-06-29 MED ORDER — HYDROMORPHONE HCL 1 MG/ML IJ SOLN: 0.2500 mg | INTRAMUSCULAR | Status: DC | PRN

## 2018-06-29 MED ORDER — CARVEDILOL 3.125 MG PO TABS
3.1250 mg | ORAL_TABLET | Freq: Two times a day (BID) | ORAL | Status: DC
  Administered 2018-06-29 – 2018-06-30 (×3): 3.125 mg via ORAL
  Filled 2018-06-29 (×3): qty 1

## 2018-06-29 MED ORDER — METHOCARBAMOL 500 MG PO TABS
500.0000 mg | ORAL_TABLET | Freq: Four times a day (QID) | ORAL | Status: DC | PRN
  Administered 2018-06-30: 500 mg via ORAL
  Filled 2018-06-29: qty 1

## 2018-06-29 MED ORDER — NITROGLYCERIN 0.4 MG SL SUBL: 0.4000 mg | SUBLINGUAL_TABLET | SUBLINGUAL | Status: DC | PRN

## 2018-06-29 MED ORDER — EPHEDRINE SULFATE 50 MG/ML IJ SOLN
INTRAMUSCULAR | Status: DC | PRN
  Administered 2018-06-29: 10 mg via INTRAVENOUS
  Administered 2018-06-29: 15 mg via INTRAVENOUS
  Administered 2018-06-29: 10 mg via INTRAVENOUS

## 2018-06-29 MED ORDER — MEPERIDINE HCL 50 MG/ML IJ SOLN: 6.2500 mg | INTRAMUSCULAR | Status: DC | PRN

## 2018-06-29 MED ORDER — SODIUM CHLORIDE 0.9 % IR SOLN
Status: DC | PRN
  Administered 2018-06-29: 3000 mL

## 2018-06-29 MED ORDER — MIDAZOLAM HCL 2 MG/2ML IJ SOLN
INTRAMUSCULAR | Status: AC
  Filled 2018-06-29: qty 2

## 2018-06-29 MED ORDER — FUROSEMIDE 40 MG PO TABS: 40.0000 mg | ORAL_TABLET | Freq: Every day | ORAL | Status: DC | PRN

## 2018-06-29 MED ORDER — FENTANYL CITRATE (PF) 250 MCG/5ML IJ SOLN
INTRAMUSCULAR | Status: AC
  Filled 2018-06-29: qty 5

## 2018-06-29 MED ORDER — KCL IN DEXTROSE-NACL 20-5-0.45 MEQ/L-%-% IV SOLN
INTRAVENOUS | Status: DC
  Administered 2018-06-29 – 2018-06-30 (×3): via INTRAVENOUS
  Filled 2018-06-29 (×3): qty 1000

## 2018-06-29 MED ORDER — SODIUM CHLORIDE 0.9 % IJ SOLN
INTRAMUSCULAR | Status: DC | PRN
  Administered 2018-06-29: 50 mL

## 2018-06-29 MED ORDER — TIZANIDINE HCL 2 MG PO TABS: 2.0000 mg | ORAL_TABLET | Freq: Four times a day (QID) | ORAL | 0 refills | Status: DC | PRN

## 2018-06-29 MED ORDER — FLEET ENEMA 7-19 GM/118ML RE ENEM
1.0000 | ENEMA | Freq: Once | RECTAL | Status: DC | PRN
Start: 2018-06-29 — End: 2018-06-30

## 2018-06-29 MED ORDER — BISACODYL 5 MG PO TBEC: 5.0000 mg | DELAYED_RELEASE_TABLET | Freq: Every day | ORAL | Status: DC | PRN

## 2018-06-29 MED ORDER — LOSARTAN POTASSIUM 50 MG PO TABS
50.0000 mg | ORAL_TABLET | Freq: Every day | ORAL | Status: DC
  Administered 2018-06-29 – 2018-06-30 (×2): 50 mg via ORAL
  Filled 2018-06-29 (×2): qty 1

## 2018-06-29 MED ORDER — FENTANYL CITRATE (PF) 100 MCG/2ML IJ SOLN
INTRAMUSCULAR | Status: DC | PRN
  Administered 2018-06-29 (×3): 50 ug via INTRAVENOUS
  Administered 2018-06-29 (×2): 25 ug via INTRAVENOUS

## 2018-06-29 MED ORDER — LIDOCAINE 2% (20 MG/ML) 5 ML SYRINGE
INTRAMUSCULAR | Status: DC | PRN
  Administered 2018-06-29: 100 mg via INTRAVENOUS

## 2018-06-29 MED ORDER — PROMETHAZINE HCL 25 MG/ML IJ SOLN: 6.2500 mg | INTRAMUSCULAR | Status: DC | PRN

## 2018-06-29 MED ORDER — PROPOFOL 10 MG/ML IV BOLUS
INTRAVENOUS | Status: DC | PRN
  Administered 2018-06-29: 140 mg via INTRAVENOUS

## 2018-06-29 MED ORDER — DEXAMETHASONE SODIUM PHOSPHATE 10 MG/ML IJ SOLN
INTRAMUSCULAR | Status: AC
  Filled 2018-06-29: qty 2

## 2018-06-29 MED ORDER — DOCUSATE SODIUM 100 MG PO CAPS
100.0000 mg | ORAL_CAPSULE | Freq: Two times a day (BID) | ORAL | Status: DC
  Administered 2018-06-29 – 2018-06-30 (×2): 100 mg via ORAL
  Filled 2018-06-29 (×2): qty 1

## 2018-06-29 MED ORDER — ASPIRIN 81 MG PO CHEW
81.0000 mg | CHEWABLE_TABLET | Freq: Two times a day (BID) | ORAL | Status: DC
  Administered 2018-06-29 – 2018-06-30 (×2): 81 mg via ORAL
  Filled 2018-06-29 (×2): qty 1

## 2018-06-29 MED ORDER — ROPIVACAINE HCL 7.5 MG/ML IJ SOLN
INTRAMUSCULAR | Status: DC | PRN
  Administered 2018-06-29: 20 mL via PERINEURAL

## 2018-06-29 MED ORDER — ACETAMINOPHEN 325 MG PO TABS
325.0000 mg | ORAL_TABLET | Freq: Four times a day (QID) | ORAL | Status: DC | PRN
  Administered 2018-06-30: 650 mg via ORAL
  Filled 2018-06-29: qty 2

## 2018-06-29 MED ORDER — BUPIVACAINE-EPINEPHRINE (PF) 0.25% -1:200000 IJ SOLN
INTRAMUSCULAR | Status: AC
  Filled 2018-06-29: qty 60

## 2018-06-29 MED ORDER — OXYCODONE HCL 5 MG PO TABS: 5.0000 mg | ORAL_TABLET | ORAL | Status: DC | PRN

## 2018-06-29 MED ORDER — MENTHOL 3 MG MT LOZG: 1.0000 | LOZENGE | OROMUCOSAL | Status: DC | PRN

## 2018-06-29 MED ORDER — POLYETHYLENE GLYCOL 3350 17 G PO PACK: 17.0000 g | PACK | Freq: Every day | ORAL | Status: DC | PRN

## 2018-06-29 MED ORDER — DEXTROSE 5 % IV SOLN
INTRAVENOUS | Status: DC | PRN
  Administered 2018-06-29: 40 ug/min via INTRAVENOUS

## 2018-06-29 MED ORDER — ONDANSETRON HCL 4 MG PO TABS: 4.0000 mg | ORAL_TABLET | Freq: Four times a day (QID) | ORAL | Status: DC | PRN

## 2018-06-29 MED ORDER — DEXAMETHASONE SODIUM PHOSPHATE 4 MG/ML IJ SOLN
INTRAMUSCULAR | Status: DC | PRN
  Administered 2018-06-29: 5 mg via INTRAVENOUS

## 2018-06-29 MED ORDER — FENTANYL CITRATE (PF) 100 MCG/2ML IJ SOLN
INTRAMUSCULAR | Status: AC
  Administered 2018-06-29: 50 ug
  Filled 2018-06-29: qty 2

## 2018-06-29 MED ORDER — GABAPENTIN 300 MG PO CAPS
300.0000 mg | ORAL_CAPSULE | Freq: Three times a day (TID) | ORAL | Status: DC
  Administered 2018-06-29 – 2018-06-30 (×4): 300 mg via ORAL
  Filled 2018-06-29 (×4): qty 1

## 2018-06-29 MED ORDER — SODIUM CHLORIDE 0.9 % IV SOLN
1000.0000 mg | Freq: Once | INTRAVENOUS | Status: AC
  Administered 2018-06-29: 1000 mg via INTRAVENOUS
  Filled 2018-06-29: qty 10

## 2018-06-29 MED ORDER — ASPIRIN EC 81 MG PO TBEC: 81.0000 mg | DELAYED_RELEASE_TABLET | Freq: Two times a day (BID) | ORAL | 0 refills | Status: DC

## 2018-06-29 MED ORDER — LIDOCAINE 2% (20 MG/ML) 5 ML SYRINGE
INTRAMUSCULAR | Status: AC
  Filled 2018-06-29: qty 15

## 2018-06-29 MED ORDER — METHOCARBAMOL 1000 MG/10ML IJ SOLN
500.0000 mg | Freq: Four times a day (QID) | INTRAVENOUS | Status: DC | PRN
  Filled 2018-06-29: qty 5

## 2018-06-29 MED ORDER — LACTATED RINGERS IV SOLN
INTRAVENOUS | Status: DC
  Administered 2018-06-29 (×2): via INTRAVENOUS

## 2018-06-29 SURGICAL SUPPLY — 52 items
BANDAGE ACE 6X5 VEL STRL LF (GAUZE/BANDAGES/DRESSINGS) ×2 IMPLANT
BANDAGE ESMARK 6X9 LF (GAUZE/BANDAGES/DRESSINGS) ×1 IMPLANT
BLADE SAG 18X100X1.27 (BLADE) ×3 IMPLANT
BLADE SAGITTAL 13X1.27X60 (BLADE) IMPLANT
BLADE SAGITTAL 13X1.27X60MM (BLADE)
BLADE SAW SGTL 13X75X1.27 (BLADE) IMPLANT
BNDG CMPR 9X6 STRL LF SNTH (GAUZE/BANDAGES/DRESSINGS) ×1
BNDG CMPR MED 10X6 ELC LF (GAUZE/BANDAGES/DRESSINGS) ×1
BNDG ELASTIC 6X10 VLCR STRL LF (GAUZE/BANDAGES/DRESSINGS) ×3 IMPLANT
BNDG ESMARK 6X9 LF (GAUZE/BANDAGES/DRESSINGS) ×3
BOWL SMART MIX CTS (DISPOSABLE) ×3 IMPLANT
CAPT KNEE TOTAL 3 ATTUNE ×3 IMPLANT
CEMENT HV SMART SET (Cement) ×6 IMPLANT
COVER SURGICAL LIGHT HANDLE (MISCELLANEOUS) ×3 IMPLANT
CUFF TOURNIQUET SINGLE 34IN LL (TOURNIQUET CUFF) ×3 IMPLANT
CUFF TOURNIQUET SINGLE 44IN (TOURNIQUET CUFF) IMPLANT
DRAPE EXTREMITY T 121X128X90 (DRAPE) ×3 IMPLANT
DRAPE U-SHAPE 47X51 STRL (DRAPES) ×3 IMPLANT
DRSG AQUACEL AG ADV 3.5X10 (GAUZE/BANDAGES/DRESSINGS) ×3 IMPLANT
DURAPREP 26ML APPLICATOR (WOUND CARE) ×3 IMPLANT
ELECT REM PT RETURN 9FT ADLT (ELECTROSURGICAL) ×3
ELECTRODE REM PT RTRN 9FT ADLT (ELECTROSURGICAL) ×1 IMPLANT
GLOVE BIO SURGEON STRL SZ7.5 (GLOVE) ×3 IMPLANT
GLOVE BIO SURGEON STRL SZ8.5 (GLOVE) ×3 IMPLANT
GLOVE BIOGEL PI IND STRL 8 (GLOVE) ×1 IMPLANT
GLOVE BIOGEL PI IND STRL 9 (GLOVE) ×1 IMPLANT
GLOVE BIOGEL PI INDICATOR 8 (GLOVE) ×2
GLOVE BIOGEL PI INDICATOR 9 (GLOVE) ×2
GOWN STRL REUS W/ TWL LRG LVL3 (GOWN DISPOSABLE) ×1 IMPLANT
GOWN STRL REUS W/ TWL XL LVL3 (GOWN DISPOSABLE) ×2 IMPLANT
GOWN STRL REUS W/TWL LRG LVL3 (GOWN DISPOSABLE) ×3
GOWN STRL REUS W/TWL XL LVL3 (GOWN DISPOSABLE) ×6
HANDPIECE INTERPULSE COAX TIP (DISPOSABLE) ×3
HOOD PEEL AWAY FACE SHEILD DIS (HOOD) ×6 IMPLANT
KIT BASIN OR (CUSTOM PROCEDURE TRAY) ×3 IMPLANT
KIT TURNOVER KIT B (KITS) ×3 IMPLANT
MANIFOLD NEPTUNE II (INSTRUMENTS) ×3 IMPLANT
NEEDLE 22X1 1/2 (OR ONLY) (NEEDLE) ×6 IMPLANT
NS IRRIG 1000ML POUR BTL (IV SOLUTION) ×3 IMPLANT
PACK TOTAL JOINT (CUSTOM PROCEDURE TRAY) ×3 IMPLANT
PAD ARMBOARD 7.5X6 YLW CONV (MISCELLANEOUS) ×6 IMPLANT
SET HNDPC FAN SPRY TIP SCT (DISPOSABLE) ×1 IMPLANT
SUT VIC AB 1 CTX 36 (SUTURE) ×3
SUT VIC AB 1 CTX36XBRD ANBCTR (SUTURE) ×1 IMPLANT
SUT VIC AB 2-0 CT1 27 (SUTURE) ×3
SUT VIC AB 2-0 CT1 TAPERPNT 27 (SUTURE) ×1 IMPLANT
SUT VIC AB 3-0 CT1 27 (SUTURE) ×3
SUT VIC AB 3-0 CT1 TAPERPNT 27 (SUTURE) ×1 IMPLANT
SYR CONTROL 10ML LL (SYRINGE) ×6 IMPLANT
TOWEL OR 17X24 6PK STRL BLUE (TOWEL DISPOSABLE) ×3 IMPLANT
TOWEL OR 17X26 10 PK STRL BLUE (TOWEL DISPOSABLE) ×3 IMPLANT
TRAY CATH 16FR W/PLASTIC CATH (SET/KITS/TRAYS/PACK) IMPLANT

## 2018-06-29 NOTE — Op Note (Signed)
PATIENT ID:      [name] [name]  MRN:     [id number] DOB/AGE:    1931/12/09 / 84 y.o.       OPERATIVE REPORT    DATE OF PROCEDURE:  06/29/2018       PREOPERATIVE DIAGNOSIS:   LEFT KNEE OSTEOARTHRITIS      Estimated body mass index is 33.33 kg/m as calculated from the following:   Height as of 06/20/18: 5\' 6"  (1.676 m).   Weight as of 06/20/18: 206 lb 8 oz (93.7 kg).                                                        POSTOPERATIVE DIAGNOSIS:   LEFT KNEE OSTEOARTHRITIS                                                                      PROCEDURE:  Procedure(s): LEFT TOTAL KNEE ARTHROPLASTY Using DepuyAttune RP implants #7L Femur, #7Tibia, 5 mm Attune RP bearing, 35 Patella     SURGEON: Kerin Salen    ASSISTANT:   Kerry Hough. [employer]   (Present and scrubbed throughout the case, critical for assistance with exposure, retraction, instrumentation, and closure.)         ANESTHESIA: GET, 20cc Exparel, 50cc 0.25% Marcaine  EBL: 450cc  FLUID REPLACEMENT: 1500cc crystalloid  TOURNIQUET TIME: 45min  Drains: None  Tranexamic Acid: 1gm IV, 2gm topical  COMPLICATIONS:  None         INDICATIONS FOR PROCEDURE: The patient has  LEFT KNEE OSTEOARTHRITIS, Var deformities, XR shows bone on bone arthritis, lateral subluxation of tibia. Patient has failed all conservative measures including anti-inflammatory medicines, narcotics, attempts at  exercise and weight loss, cortisone injections and viscosupplementation.  Risks and benefits of surgery have been discussed, questions answered.   DESCRIPTION OF PROCEDURE: The patient identified by armband, received  IV antibiotics, in the holding area at [facility]. Patient taken to the operating room, appropriate anesthetic  monitors were attached, and GET anesthesia was  induced. Tourniquet  applied high to the operative thigh. Lateral post and foot positioner  applied to the table, the lower extremity was then prepped and draped  in usual  sterile fashion from the toes to the tourniquet. Time-out procedure was performed. We began the operation, with the knee flexed 120 degrees, by making the anterior midline incision starting at handbreadth above the patella going over the patella 1 cm medial to and 4 cm distal to the tibial tubercle. Small bleeders in the skin and the  subcutaneous tissue identified and cauterized. Transverse retinaculum was incised and reflected medially and a medial parapatellar arthrotomy was accomplished. the patella was everted and theprepatellar fat pad resected. The superficial medial collateral  ligament was then elevated from anterior to posterior along the proximal  flare of the tibia and anterior half of the menisci resected. The knee was hyperflexed exposing bone on bone arthritis. Peripheral and notch osteophytes as well as the cruciate ligaments were then resected. We continued to  work our way around posteriorly along the  proximal tibia, and externally  rotated the tibia subluxing it out from underneath the femur. A McHale  retractor was placed through the notch and a lateral Hohmann retractor  placed, and we then drilled through the proximal tibia in line with the  axis of the tibia followed by an intramedullary guide rod and 2-degree  posterior slope cutting guide. The tibial cutting guide, 3 degree posterior sloped, was pinned into place allowing resection of 4 mm of bone medially and 10 mm of bone laterally. Satisfied with the tibial resection, we then  entered the distal femur 2 mm anterior to the PCL origin with the  intramedullary guide rod and applied the distal femoral cutting guide  set at 9 mm, with 5 degrees of valgus. This was pinned along the  epicondylar axis. At this point, the distal femoral cut was accomplished without difficulty. We then sized for a #7L femoral component and pinned the guide in 3 degrees of external rotation. The chamfer cutting guide was pinned into place. The anterior,  posterior, and chamfer cuts were accomplished without difficulty followed by  the Attune RP box cutting guide and the box cut. We also removed posterior osteophytes from the posterior femoral condyles. At this  time, the knee was brought into full extension. We checked our  extension and flexion gaps and found them symmetric for a 5 mm bearing. Distracting in extension with a lamina spreader, the posterior horns of the menisci were removed, and Exparel, diluted to 60 cc, with 20cc NS, and 20cc 0.5% Marcaine,was injected into the capsule and synovium of the knee. The posterior patella cut was accomplished with the 9.5 mm Attune cutting guide, sized for a 105mm dome, and the fixation pegs drilled.The knee  was then once again hyperflexed exposing the proximal tibia. We sized for a # 7 tibial base plate, applied the smokestack and the conical reamer followed by the the Delta fin keel punch. We then hammered into place the Attune RP trial femoral component, drilled the lugs, inserted a  5 mm trial bearing, trial patellar button, and took the knee through range of motion from 0-130 degrees. No thumb pressure was required for patellar Tracking. At this point, the limb was wrapped with an Esmarch bandage and the tourniquet inflated to 350 mmHg. All trial components were removed, mating surfaces irrigated with pulse lavage, and dried with suction and sponges. 10 cc of the Exparel solution was applied to the cancellus bone of the patella distal femur and proximal tibia.  After waiting 1 minute, the bony surfaces were again, dried with sponges. A double batch of DePuy HV cement with 1500 mg of Zinacef was mixed and applied to all bony metallic mating surfaces except for the posterior condyles of the femur itself. In order, we hammered into place the tibial tray and removed excess cement, the femoral component and removed excess cement. The final Attune RP bearing  was inserted, and the knee brought to full extension with  compression.  The patellar button was clamped into place, and excess cement  removed. While the cement cured the wound was irrigated out with normal saline solution pulse lavage. Ligament stability and patellar tracking were checked and found to be excellent. The parapatellar arthrotomy was closed with  running #1 Vicryl suture. The subcutaneous tissue with 0 and 2-0 undyed  Vicryl suture, and the skin with running 3-0 SQ vicryl. A dressing of Xeroform,  4 x 4, dressing sponges, Webril, and Ace wrap applied. The patient  awakened,  and taken to recovery room without difficulty.   Kerin Salen 06/29/2018, 10:55 AM

## 2018-06-29 NOTE — Evaluation (Signed)
Physical Therapy Evaluation Patient Details Name: [name] [name] MRN: [id number] DOB: [birth year] Today's Date: 06/29/2018   History of Present Illness  Pt is an 84 y/o male s/p L TKA. PMH including but not limited to HTN, CAD s/p MI.  Clinical Impression  Pt presented supine in bed with HOB elevated, awake and willing to participate in therapy session. Prior to admission, pt reported that he ambulated with [facility] and was independent with ADLs. Pt currently requires min guard for bed mobility, min guard for transfers and min guard for ambulation with RW. Pt would continue to benefit from skilled physical therapy services at this time while admitted and after d/c to address the below listed limitations in order to improve overall safety and independence with functional mobility.     Follow Up Recommendations Home health PT;Supervision - Intermittent    Equipment Recommendations  Rolling walker with 5" wheels    Recommendations for Other Services       Precautions / Restrictions Precautions Precautions: Fall;Knee Precaution Booklet Issued: Yes (comment) Precaution Comments: PT reviewed positioning of LE following TKA surgery Restrictions Weight Bearing Restrictions: Yes LLE Weight Bearing: Weight bearing as tolerated      Mobility  Bed Mobility Overal bed mobility: Needs Assistance Bed Mobility: Supine to Sit     Supine to sit: Min guard     General bed mobility comments: increased time and effort, min guard for safety  Transfers Overall transfer level: Needs assistance Equipment used: Rolling walker (2 wheeled) Transfers: Sit to/from Stand Sit to Stand: Min guard         General transfer comment: increased time and effort, cueing for safe hand placement  Ambulation/Gait Ambulation/Gait assistance: Min guard Gait Distance (Feet): 100 Feet Assistive device: Rolling walker (2 wheeled) Gait Pattern/deviations: Step-through pattern;Decreased step length - right;Decreased  step length - left;Decreased stride length;Decreased stance time - left;Decreased weight shift to left Gait velocity: decreased Gait velocity interpretation: 1.31 - 2.62 ft/sec, indicative of limited community ambulator General Gait Details: pt with mild instability but no overt LOB or need for physical assistance, cueing to maintain a safe distance from [employer]    Modified Rankin (Stroke Patients Only)       Balance Overall balance assessment: Needs assistance Sitting-balance support: Feet supported Sitting balance-Leahy Scale: Good     Standing balance support: During functional activity;Bilateral upper extremity supported Standing balance-Leahy Scale: Poor                               Pertinent Vitals/Pain Pain Assessment: Faces Faces Pain Scale: Hurts a little bit Pain Location: L knee Pain Descriptors / Indicators: Sore Pain Intervention(s): Monitored during session;Repositioned    Home Living Family/patient expects to be discharged to:: Private residence Living Arrangements: Spouse/significant other Available Help at Discharge: Family;Available 24 hours/day Type of Home: House Home Access: Stairs to enter Entrance Stairs-Rails: Right;Left;Can reach both Entrance Stairs-Number of Steps: 2 Home Layout: One level Home Equipment: Cane - single point      Prior Function Level of Independence: Independent with assistive device(s)         Comments: ambulates with SPC     Hand Dominance        Extremity/Trunk Assessment   Upper Extremity Assessment Upper Extremity Assessment: Overall WFL for tasks assessed    Lower Extremity Assessment Lower Extremity Assessment: LLE deficits/detail LLE  Deficits / Details: decreased strength and ROM limitations secondary to post-op pain and weakness       Communication   Communication: No difficulties  Cognition Arousal/Alertness: Awake/alert Behavior During  Therapy: WFL for tasks assessed/performed Overall Cognitive Status: Within Functional Limits for tasks assessed                                        General Comments      Exercises Total Joint Exercises Quad Sets: AROM;Strengthening;Left;10 reps;Seated Long Arc Quad: AROM;Strengthening;Left;20 reps;Seated Knee Flexion: AROM;Strengthening;Left;20 reps;Seated   Assessment/Plan    PT Assessment Patient needs continued PT services  PT Problem List Decreased strength;Decreased range of motion;Decreased mobility;Decreased activity tolerance;Decreased balance;Decreased coordination;Decreased knowledge of use of DME;Decreased safety awareness;Decreased knowledge of precautions;Pain       PT Treatment Interventions DME instruction;Gait training;Stair training;Functional mobility training;Therapeutic activities;Therapeutic exercise;Balance training;Neuromuscular re-education;Patient/family education    PT Goals (Current goals can be found in the Care Plan section)  Acute Rehab PT Goals Patient Stated Goal: return home PT Goal Formulation: With patient Time For Goal Achievement: 07/13/18 Potential to Achieve Goals: Good    Frequency 7X/week   Barriers to discharge        Co-evaluation               AM-PAC PT "6 Clicks" Daily Activity  Outcome Measure Difficulty turning over in bed (including adjusting bedclothes, sheets and blankets)?: None Difficulty moving from lying on back to sitting on the side of the bed? : None Difficulty sitting down on and standing up from a chair with arms (e.g., wheelchair, bedside commode, etc,.)?: Unable Help needed moving to and from a bed to chair (including a wheelchair)?: A Little Help needed walking in hospital room?: A Little Help needed climbing 3-5 steps with a railing? : A Little 6 Click Score: 18    End of Session Equipment Utilized During Treatment: Gait belt Activity Tolerance: Patient tolerated treatment  well Patient left: in chair;with call bell/phone within reach;with family/visitor present Nurse Communication: Mobility status PT Visit Diagnosis: Other abnormalities of gait and mobility (R26.89);Pain Pain - Right/Left: Left Pain - part of body: Knee    Time: 1523-1550 PT Time Calculation (min) (ACUTE ONLY): 27 min   Charges:   PT Evaluation $PT Eval Moderate Complexity: 1 Mod PT Treatments $Gait Training: 8-22 mins   PT G Codes:        [place], PT, DPT [place] 06/29/2018, 4:34 PM

## 2018-06-29 NOTE — Interval H&P Note (Signed)
History and Physical Interval Note:  06/29/2018 9:15 AM  [name] [name]  has presented today for surgery, with the diagnosis of LEFT KNEE OSTEOARTHRITIS  The various methods of treatment have been discussed with the patient and family. After consideration of risks, benefits and other options for treatment, the patient has consented to  Procedure(s): LEFT TOTAL KNEE ARTHROPLASTY (Left) as a surgical intervention .  The patient's history has been reviewed, patient examined, no change in status, stable for surgery.  I have reviewed the patient's chart and labs.  Questions were answered to the patient's satisfaction.     Kerin Salen

## 2018-06-29 NOTE — Anesthesia Preprocedure Evaluation (Addendum)
Anesthesia Evaluation  Patient identified by MRN, date of birth, ID band Patient awake    Reviewed: Allergy & Precautions, H&P , NPO status , Patient's Chart, lab work & pertinent test results, reviewed documented beta blocker date and time   History of Anesthesia Complications Negative for: history of anesthetic complications  Airway Mallampati: III  TM Distance: >3 FB Neck ROM: full    Dental no notable dental hx. (+) Teeth Intact   Pulmonary shortness of breath and with exertion, neg sleep apnea, neg COPD, neg recent URI, former smoker,    Pulmonary exam normal breath sounds clear to auscultation       Cardiovascular Exercise Tolerance: Good hypertension, Pt. on home beta blockers and Pt. on medications (-) angina+ CAD, + Past MI and + Cardiac Stents  (-) CABG Normal cardiovascular exam+ dysrhythmias + Valvular Problems/Murmurs  Rhythm:regular Rate:Normal     Neuro/Psych negative neurological ROS  negative psych ROS   GI/Hepatic Neg liver ROS, GERD  ,  Endo/Other  negative endocrine ROS  Renal/GU CRFRenal disease  negative genitourinary   Musculoskeletal  (+) Arthritis ,   Abdominal   Peds  Hematology negative hematology ROS (+)   Anesthesia Other Findings Past Medical History: No date: Arthritis 10/21/2011: CAD (coronary artery disease)     Comment: s/p stent placement No date: Hyperlipidemia No date: Hypertension 2008 & 2013: Myocardial infarction ([place]) No date: Renal insufficiency, mild No date: Sinus bradycardia     Comment: Assymptomatic No date: Tobacco user     Comment: Remote   Reproductive/Obstetrics negative OB ROS                            Anesthesia Physical  Anesthesia Plan  ASA: III  Anesthesia Plan: General   Post-op Pain Management: GA combined w/ Regional for post-op pain   Induction:   PONV Risk Score and Plan: 2 and Ondansetron, Dexamethasone and  Treatment may vary due to age or medical condition  Airway Management Planned: LMA  Additional Equipment:   Intra-op Plan:   Post-operative Plan: Extubation in OR  Informed Consent: I have reviewed the patients History and Physical, chart, labs and discussed the procedure including the risks, benefits and alternatives for the proposed anesthesia with the patient or authorized representative who has indicated his/her understanding and acceptance.   Dental advisory given  Plan Discussed with: CRNA  Anesthesia Plan Comments: (Pt last dose 6 days ago. Plan GA.)       Anesthesia Quick Evaluation

## 2018-06-29 NOTE — Progress Notes (Signed)
Orthopedic Tech Progress Note Patient Details:  [name] [name] April 17, 1931 [id number]  Ortho Devices Type of Ortho Device: Bone foam zero knee   Post Interventions Instructions Provided: Care of device   [place] Pink 06/29/2018, 11:44 AM

## 2018-06-29 NOTE — Anesthesia Procedure Notes (Signed)
Anesthesia Regional Block: Adductor canal block   Pre-Anesthetic Checklist: ,, timeout performed, Correct Patient, Correct Site, Correct Laterality, Correct Procedure, Correct Position, site marked, Risks and benefits discussed,  Surgical consent,  Pre-op evaluation,  At surgeon's request and post-op pain management  Laterality: Left  Prep: chloraprep       Needles:  Injection technique: Single-shot  Needle Type: Stimiplex     Needle Length: 9cm  Needle Gauge: 21     Additional Needles:   Procedures:,,,, ultrasound used (permanent image in chart),,,,  Narrative:  Start time: 06/29/2018 8:34 AM End time: 06/29/2018 8:36 AM Injection made incrementally with aspirations every 5 mL.  Performed by: Personally  Anesthesiologist: Nolon Nations, MD  Additional Notes: BP cuff, EKG monitors applied. Sedation begun. Artery and nerve location verified with U/S and anesthetic injected incrementally, slowly, and after negative aspirations under direct u/s guidance. Good fascial /perineural spread. Tolerated well.

## 2018-06-29 NOTE — Plan of Care (Signed)
  Problem: Activity: Goal: Risk for activity intolerance will decrease Outcome: Progressing   

## 2018-06-29 NOTE — Anesthesia Procedure Notes (Signed)
Procedure Name: LMA Insertion Date/Time: 06/29/2018 9:33 AM Performed by: White, Amedeo Plenty, CRNA Pre-anesthesia Checklist: Patient identified, Emergency Drugs available, Suction available and Patient being monitored Patient Re-evaluated:Patient Re-evaluated prior to induction Oxygen Delivery Method: Circle System Utilized Preoxygenation: Pre-oxygenation with 100% oxygen Induction Type: IV induction Ventilation: Mask ventilation without difficulty LMA: LMA inserted LMA Size: 5.0 Number of attempts: 1 Placement Confirmation: positive ETCO2 Tube secured with: Tape Dental Injury: Teeth and Oropharynx as per pre-operative assessment

## 2018-06-29 NOTE — Discharge Instructions (Signed)

## 2018-06-29 NOTE — Transfer of Care (Signed)
Immediate Anesthesia Transfer of Care Note  Patient: [name] [name]  Procedure(s) Performed: LEFT TOTAL KNEE ARTHROPLASTY (Left Knee)  Patient Location: PACU  Anesthesia Type:GA combined with regional for post-op pain  Level of Consciousness: drowsy and patient cooperative  Airway & Oxygen Therapy: Patient Spontanous Breathing  Post-op Assessment: Report given to RN and Post -op Vital signs reviewed and stable  Post vital signs: Reviewed and stable  Last Vitals:  Vitals Value Taken Time  BP 126/56 06/29/2018 11:13 AM  Temp    Pulse 51 06/29/2018 11:14 AM  Resp 13 06/29/2018 11:14 AM  SpO2 94 % 06/29/2018 11:14 AM  Vitals shown include unvalidated device data.  Last Pain: There were no vitals filed for this visit.       Complications: No apparent anesthesia complications

## 2018-06-30 DIAGNOSIS — Z96652 Presence of left artificial knee joint: Secondary | ICD-10-CM

## 2018-06-30 DIAGNOSIS — M1712 Unilateral primary osteoarthritis, left knee: Secondary | ICD-10-CM | POA: Diagnosis not present

## 2018-06-30 LAB — BASIC METABOLIC PANEL
ANION GAP: 6 (ref 5–15)
BUN: 32 mg/dL — AB (ref 8–23)
CO2: 21 mmol/L — ABNORMAL LOW (ref 22–32)
Calcium: 8.6 mg/dL — ABNORMAL LOW (ref 8.9–10.3)
Chloride: 107 mmol/L (ref 98–111)
Creatinine, Ser: 2.18 mg/dL — ABNORMAL HIGH (ref 0.61–1.24)
GFR calc Af Amer: 30 mL/min — ABNORMAL LOW (ref >60)
GFR calc non Af Amer: 26 mL/min — ABNORMAL LOW (ref >60)
GLUCOSE: 143 mg/dL — AB (ref 70–99)
POTASSIUM: 5.7 mmol/L — AB (ref 3.5–5.1)
Sodium: 134 mmol/L — ABNORMAL LOW (ref 135–145)

## 2018-06-30 LAB — CBC
HEMATOCRIT: 37.1 % — AB (ref 39.0–52.0)
Hemoglobin: 11.8 g/dL — ABNORMAL LOW (ref 13.0–17.0)
MCH: 33.1 pg (ref 26.0–34.0)
MCHC: 31.8 g/dL (ref 30.0–36.0)
MCV: 104.2 fL — ABNORMAL HIGH (ref 78.0–100.0)
PLATELETS: 123 10*3/uL — AB (ref 150–400)
RBC: 3.56 MIL/uL — AB (ref 4.22–5.81)
RDW: 13.9 % (ref 11.5–15.5)
WBC: 10.5 10*3/uL (ref 4.0–10.5)

## 2018-06-30 MED ORDER — DEXTROSE-NACL 5-0.45 % IV SOLN: INTRAVENOUS | Status: DC

## 2018-06-30 NOTE — Care Management Note (Signed)
Case Management Note  Patient Details  Name: [name] [name] MRN: [id number] Date of Birth: 07-23-1931  Subjective/Objective:    84 yr old gentleman s/p left total knee arthroplasty.                Action/Plan: Case manager spoke with patient concerning dischagre plan and DME. Patient [facility] with [facility], no changes. CM has requested DME. Patient will have family support at discharge.    Expected Discharge Date:    07/01/18              Expected Discharge Plan:  [place]  In-House Referral:  NA  Discharge planning Services  CM Consult  Post Acute Care Choice:  Home Health, Durable Medical Equipment Choice offered to:  Patient  DME Arranged:  3-N-1, Walker rolling DME Agency:  [place]:  PT [place]:  [place]  Status of Service:  Completed, signed off  If discussed at [place] of Stay Meetings, dates discussed:    Additional Comments:  Ninfa Meeker, RN 06/30/2018, 12:41 PM

## 2018-06-30 NOTE — Progress Notes (Signed)
Pt reported to RN that his son could come get him. Pt stated "I told my son I was being discharged.". RN again educated pt that PA had not put in orders and RN would have to verify that he had all the required items for discharge. RN attempting to verify status of Home Health and DME at this time.   1540 Pt has DME at bedside, 3 in 1 and rolling walker.

## 2018-06-30 NOTE — Progress Notes (Signed)
Physical Therapy Treatment Patient Details Name: [name] [name] MRN: [id number] DOB: 06-15-1931 Today's Date: 06/30/2018    History of Present Illness Pt is an 84 y/o male s/p L TKA. PMH including but not limited to HTN, CAD s/p MI.    PT Comments    Pt making steady progress with functional mobility. Plan for stair training at next session prior to d/c. Pt would continue to benefit from skilled physical therapy services at this time while admitted and after d/c to address the below listed limitations in order to improve overall safety and independence with functional mobility.    Follow Up Recommendations  Home health PT;Supervision - Intermittent     Equipment Recommendations  Rolling walker with 5" wheels    Recommendations for Other Services       Precautions / Restrictions Precautions Precautions: Fall;Knee Precaution Booklet Issued: Yes (comment) Precaution Comments: PT reviewed positioning of LE following TKA surgery Restrictions Weight Bearing Restrictions: Yes LLE Weight Bearing: Weight bearing as tolerated    Mobility  Bed Mobility               General bed mobility comments: pt OOB in recliner chair upon arrival  Transfers Overall transfer level: Needs assistance Equipment used: Rolling walker (2 wheeled) Transfers: Sit to/from Stand Sit to Stand: Supervision         General transfer comment: good technique, supervision for safety  Ambulation/Gait Ambulation/Gait assistance: Min guard Gait Distance (Feet): 150 Feet Assistive device: Rolling walker (2 wheeled) Gait Pattern/deviations: Step-through pattern;Decreased step length - right;Decreased step length - left;Decreased stride length;Decreased stance time - left;Decreased weight shift to left Gait velocity: decreased Gait velocity interpretation: 1.31 - 2.62 ft/sec, indicative of limited community ambulator General Gait Details: pt with mild instability but no overt LOB or need for physical  assistance, cueing to maintain a safe distance from [employer]    Modified Rankin (Stroke Patients Only)       Balance Overall balance assessment: Needs assistance Sitting-balance support: Feet supported Sitting balance-Leahy Scale: Good     Standing balance support: During functional activity;Bilateral upper extremity supported Standing balance-Leahy Scale: Poor                              Cognition Arousal/Alertness: Awake/alert Behavior During Therapy: WFL for tasks assessed/performed Overall Cognitive Status: Within Functional Limits for tasks assessed                                        Exercises Total Joint Exercises Heel Slides: AROM;Strengthening;Left;10 reps;Seated Hip ABduction/ADduction: AROM;Strengthening;Left;10 reps;Seated Straight Leg Raises: AROM;Strengthening;Left;10 reps;Seated Goniometric ROM: Flexion = 80 degrees, Extension = lacking 20 degrees to neutral    General Comments        Pertinent Vitals/Pain Pain Assessment: 0-10 Pain Score: 4  Pain Location: L knee Pain Descriptors / Indicators: Sore Pain Intervention(s): Monitored during session;Repositioned    Home Living                      Prior Function            PT Goals (current goals can now be found in the care plan section) Acute Rehab PT Goals Patient Stated Goal: play golf PT Goal Formulation: With patient  Time For Goal Achievement: 07/13/18 Potential to Achieve Goals: Good Progress towards PT goals: Progressing toward goals    Frequency    7X/week      PT Plan Current plan remains appropriate    Co-evaluation              AM-PAC PT "6 Clicks" Daily Activity  Outcome Measure  Difficulty turning over in bed (including adjusting bedclothes, sheets and blankets)?: None Difficulty moving from lying on back to sitting on the side of the bed? : None Difficulty sitting down on and  standing up from a chair with arms (e.g., wheelchair, bedside commode, etc,.)?: Unable Help needed moving to and from a bed to chair (including a wheelchair)?: A Little Help needed walking in hospital room?: A Little Help needed climbing 3-5 steps with a railing? : A Little 6 Click Score: 18    End of Session Equipment Utilized During Treatment: Gait belt Activity Tolerance: Patient tolerated treatment well Patient left: in chair;with call bell/phone within reach Nurse Communication: Mobility status PT Visit Diagnosis: Other abnormalities of gait and mobility (R26.89);Pain Pain - Right/Left: Left Pain - part of body: Knee     Time: 0922-0940 PT Time Calculation (min) (ACUTE ONLY): 18 min  Charges:  $Gait Training: 8-22 mins                    G Codes:       [place], [place], [place] 06/30/2018, 9:53 AM

## 2018-06-30 NOTE — Progress Notes (Signed)
Spoke with CM Claudie Leach. She stated she would make sure HH was set up and pt could leave. RN to complete d/c paperwork at this time.

## 2018-06-30 NOTE — Progress Notes (Signed)
PATIENT ID: SINAN TUCH  MRN: [id number]  DOB/AGE:  07-27-1931 / 84 y.o.  1 Day Post-Op Procedure(s) (LRB): LEFT TOTAL KNEE ARTHROPLASTY (Left)    PROGRESS NOTE Subjective: Patient is alert, oriented, no Nausea, no Vomiting, yes passing gas. Taking PO well. Denies SOB, Chest or Calf Pain. Using Incentive Spirometer, PAS in place. Ambulate WBAT with pt walking 100 ft with therapy, Patient reports pain as mild cramping .    Objective: Vital signs in last 24 hours: Vitals:   06/29/18 1701 06/29/18 2043 06/30/18 0100 06/30/18 0351  BP:  (!) 124/59 (!) 121/57 126/63  Pulse: 83 (!) 59 65 (!) 55  Resp:  18  16  Temp:  (!) 97.5 F (36.4 C) 98.3 F (36.8 C) 97.8 F (36.6 C)  TempSrc:  Oral Oral Oral  SpO2:  95% 95% 97%      Intake/Output from previous day: I/O last 3 completed shifts: In: 3739.2 [P.O.:860; I.V.:2775.8; IV Piggyback:103.4] Out: 1450 [Urine:800; Blood:650]   Intake/Output this shift: No intake/output data recorded.   LABORATORY DATA: Recent Labs    06/30/18 0654  WBC 10.5  HGB 11.8*  HCT 37.1*  PLT 123*    Examination: Neurologically intact Neurovascular intact Sensation intact distally Intact pulses distally Dorsiflexion/Plantar flexion intact Incision: dressing C/D/I and no drainage No cellulitis present Compartment soft}  Assessment:   1 Day Post-Op Procedure(s) (LRB): LEFT TOTAL KNEE ARTHROPLASTY (Left) ADDITIONAL DIAGNOSIS: Expected Acute Blood Loss Anemia, Hypertension Anticipated LOS equal to or greater than 2 midnights due to - Age 3 and older with one or more of the following:  - Obesity  - Expected need for hospital services (PT, OT, Nursing) required for safe  discharge  - Anticipated need for postoperative skilled nursing care or inpatient rehab  - Active co-morbidities: CKD    Plan: PT/OT WBAT, AROM and PROM  DVT Prophylaxis:  SCDx72hrs, ASA 81 mg BID x 2 weeks and then once daily x 2 weeks DISCHARGE PLAN: Home DISCHARGE  NEEDS: HHPT, Walker and 3-in-1 comode seat     Joanell Rising 06/30/2018, 8:59 AM

## 2018-06-30 NOTE — Progress Notes (Signed)
Nsg Discharge Note  Admit Date:  06/29/2018 Discharge date: 06/30/2018   [name] [name] to be D/C'd Home per MD order.  AVS completed.  Copy for chart, and copy for patient signed, and dated. Patient/caregiver able to verbalize understanding.  Discharge Medication: Allergies as of 06/30/2018      Reactions   Amoxicillin Rash   Has patient had a PCN reaction causing immediate rash, facial/tongue/throat swelling, SOB or lightheadedness with hypotension: Unknown Has patient had a PCN reaction causing severe rash involving mucus membranes or skin necrosis: No Has patient had a PCN reaction that required hospitalization: No Has patient had a PCN reaction occurring within the last 10 years: Yes If all of the above answers are "NO", then may proceed with Cephalosporin use.      Medication List    TAKE these medications   aspirin EC 81 MG tablet Take 1 tablet (81 mg total) by mouth 2 (two) times daily. What changed:  when to take this   carvedilol 3.125 MG tablet Commonly known as:  COREG Take 3.125 mg by mouth 2 (two) times daily with a meal.   clopidogrel 75 MG tablet Commonly known as:  PLAVIX Take 75 mg by mouth daily.   furosemide 40 MG tablet Commonly known as:  LASIX Take 40 mg by mouth daily as needed (for fluid retention.).   losartan 50 MG tablet Commonly known as:  COZAAR Take 50 mg by mouth daily at 2 PM. In the afternoon   lovastatin 40 MG tablet Commonly known as:  MEVACOR Take 40 mg by mouth at bedtime.   nitroGLYCERIN 0.4 MG SL tablet Commonly known as:  NITROSTAT Place 0.4 mg under the tongue every 5 (five) minutes as needed for chest pain.   oxyCODONE-acetaminophen 5-325 MG tablet Commonly known as:  PERCOCET/ROXICET Take 1 tablet by mouth every 4 (four) hours as needed for severe pain.   tiZANidine 2 MG tablet Commonly known as:  ZANAFLEX Take 1 tablet (2 mg total) by mouth every 6 (six) hours as needed.            Durable Medical Equipment  (From  admission, onward)        Start     Ordered   06/29/18 1202  DME Walker rolling  Once    Question:  Patient needs a walker to treat with the following condition  Answer:  Status post total left knee replacement   06/29/18 1201   06/29/18 1202  DME 3 n 1  Once     06/29/18 1201       Discharge Care Instructions  (From admission, onward)        Start     Ordered   06/30/18 0000  Weight bearing as tolerated     06/30/18 1539      Discharge Assessment: Vitals:   06/30/18 0351 06/30/18 1609  BP: 126/63 (!) 165/75  Pulse: (!) 55 66  Resp: 16   Temp: 97.8 F (36.6 C) 97.8 F (36.6 C)  SpO2: 97% 98%   Skin clean, dry and intact without evidence of skin break down, no evidence of skin tears noted. IV catheter discontinued intact. Site without signs and symptoms of complications - no redness or edema noted at insertion site, patient denies c/o pain - only slight tenderness at site.  Dressing with slight pressure applied.  D/c Instructions-Education: Discharge instructions given to patient/family with verbalized understanding. Pt's grandson [name] [name] at bedside. Verbalizes understanding as well.  D/c education completed with  patient/family including follow up instructions, medication list, d/c activities limitations if indicated, with other d/c instructions as indicated by MD - patient able to verbalize understanding, all questions fully answered. Patient instructed to return to ED, call 911, or call MD for any changes in condition.  Pt denies questions.  Patient escorted via [name], and D/C home via private auto.  [name] Keys, RN 06/30/2018 5:18 PM

## 2018-06-30 NOTE — Care Management CC44 (Signed)
Condition Code 44 Documentation Completed  Patient Details  Name: TRI CHITTICK MRN: [id number] Date of Birth: 1931/04/03   Condition Code 44 given:  Yes Patient signature on Condition Code 44 notice:  Yes Documentation of 2 MD's agreement:  Yes Code 44 added to claim:  Yes    Claudie Leach, RN 06/30/2018, 4:54 PM

## 2018-06-30 NOTE — Discharge Summary (Signed)
Patient ID: [name] [name] MRN: [id number] DOB/AGE: 1931/01/30 84 y.o.  Admit date: 06/29/2018 Discharge date: 06/30/2018  Admission Diagnoses:  Principal Problem:   Degenerative arthritis of left knee Active Problems:   Primary osteoarthritis of left knee   Discharge Diagnoses:  Same  Past Medical History:  Diagnosis Date  . Arthritis   . CAD (coronary artery disease) 10/21/2011   s/p stent placement  . Dyspnea   . Hyperlipidemia   . Hypertension   . Myocardial infarction [facility]) 2008 & 2013  . Renal insufficiency, mild   . Sinus bradycardia    Assymptomatic  . Tobacco user    Remote    Surgeries: Procedure(s): LEFT TOTAL KNEE ARTHROPLASTY on 06/29/2018   Consultants:   Discharged Condition: Improved  Hospital Course: [name] [name] is an 84 y.o. male who was admitted 06/29/2018 for operative treatment ofDegenerative arthritis of left knee. Patient has severe unremitting pain that affects sleep, daily activities, and work/hobbies. After pre-op clearance the patient was taken to the operating room on 06/29/2018 and underwent  Procedure(s): LEFT TOTAL KNEE ARTHROPLASTY.    Patient was given perioperative antibiotics:  Anti-infectives (From admission, onward)   Start     Dose/Rate Route Frequency Ordered Stop   06/29/18 0500  vancomycin (VANCOCIN) IVPB 1000 mg/200 mL premix  Status:  Discontinued     1,000 mg 200 mL/hr over 60 Minutes Intravenous To ShortStay Surgical 06/27/18 0752 06/29/18 1156       Patient was given sequential compression devices, early ambulation, and chemoprophylaxis to prevent DVT.  Patient benefited maximally from hospital stay and there were no complications.    Recent vital signs:  Patient Vitals for the past 24 hrs:  BP Temp Temp src Pulse Resp SpO2  06/30/18 0351 126/63 97.8 F (36.6 C) Oral (!) 55 16 97 %  06/30/18 0100 (!) 121/57 98.3 F (36.8 C) Oral 65 - 95 %  06/29/18 2043 (!) 124/59 (!) 97.5 F (36.4 C) Oral (!) 59 18 95 %   06/29/18 1701 - - - 83 - -     Recent laboratory studies:  Recent Labs    06/30/18 0654  WBC 10.5  HGB 11.8*  HCT 37.1*  PLT 123*  NA 134*  K 5.7*  CL 107  CO2 21*  BUN 32*  CREATININE 2.18*  GLUCOSE 143*  CALCIUM 8.6*     Discharge Medications:   Allergies as of 06/30/2018      Reactions   Amoxicillin Rash   Has patient had a PCN reaction causing immediate rash, facial/tongue/throat swelling, SOB or lightheadedness with hypotension: Unknown Has patient had a PCN reaction causing severe rash involving mucus membranes or skin necrosis: No Has patient had a PCN reaction that required hospitalization: No Has patient had a PCN reaction occurring within the last 10 years: Yes If all of the above answers are "NO", then may proceed with Cephalosporin use.      Medication List    TAKE these medications   aspirin EC 81 MG tablet Take 1 tablet (81 mg total) by mouth 2 (two) times daily. What changed:  when to take this   carvedilol 3.125 MG tablet Commonly known as:  COREG Take 3.125 mg by mouth 2 (two) times daily with a meal.   clopidogrel 75 MG tablet Commonly known as:  PLAVIX Take 75 mg by mouth daily.   furosemide 40 MG tablet Commonly known as:  LASIX Take 40 mg by mouth daily as needed (for fluid retention.).  losartan 50 MG tablet Commonly known as:  COZAAR Take 50 mg by mouth daily at 2 PM. In the afternoon   lovastatin 40 MG tablet Commonly known as:  MEVACOR Take 40 mg by mouth at bedtime.   nitroGLYCERIN 0.4 MG SL tablet Commonly known as:  NITROSTAT Place 0.4 mg under the tongue every 5 (five) minutes as needed for chest pain.   oxyCODONE-acetaminophen 5-325 MG tablet Commonly known as:  PERCOCET/ROXICET Take 1 tablet by mouth every 4 (four) hours as needed for severe pain.   tiZANidine 2 MG tablet Commonly known as:  ZANAFLEX Take 1 tablet (2 mg total) by mouth every 6 (six) hours as needed.            Durable Medical Equipment   (From admission, onward)        Start     Ordered   06/29/18 1202  DME Walker rolling  Once    Question:  Patient needs a walker to treat with the following condition  Answer:  Status post total left knee replacement   06/29/18 1201   06/29/18 1202  DME 3 n 1  Once     06/29/18 1201       Discharge Care Instructions  (From admission, onward)        Start     Ordered   06/30/18 0000  Weight bearing as tolerated     06/30/18 1539      Diagnostic Studies: Dg Chest 2 View  Result Date: 06/20/2018 CLINICAL DATA:  Preop for left total knee arthroplasty. Shortness of breath. EXAM: CHEST - 2 VIEW COMPARISON:  Radiographs of August 03, 2015. FINDINGS: The heart size and mediastinal contours are within normal limits. Both lungs are clear. No pneumothorax or pleural effusion is noted. The visualized skeletal structures are unremarkable. IMPRESSION: No active cardiopulmonary disease. Electronically Signed   By: Marijo Conception, M.D.   On: 06/20/2018 15:46    Disposition: Discharge disposition: 01-Home or Self Care       Discharge Instructions    Call MD / Call 911   Complete by:  As directed    If you experience chest pain or shortness of breath, CALL 911 and be transported to the hospital emergency room.  If you develope a fever above 101 F, pus (white drainage) or increased drainage or redness at the wound, or calf pain, call your surgeon's office.   Constipation Prevention   Complete by:  As directed    Drink plenty of fluids.  Prune juice may be helpful.  You may use a stool softener, such as Colace (over the counter) 100 mg twice a day.  Use MiraLax (over the counter) for constipation as needed.   Diet - low sodium heart healthy   Complete by:  As directed    Driving restrictions   Complete by:  As directed    No driving for 2 weeks   Increase activity slowly as tolerated   Complete by:  As directed    Patient may shower   Complete by:  As directed    You may shower  without a dressing once there is no drainage.  Do not wash over the wound.  If drainage remains, cover wound with plastic wrap and then shower.   Weight bearing as tolerated   Complete by:  As directed       Follow-up Information    Frederik Pear, MD In 2 weeks.   Specialty:  Orthopedic Surgery Contact information: [phone]  [place] [address] [phone]        [facility].   Specialty:  [place] Why:  A representative from [facility] will contact you to arrange start date and time for your therapy.  Contact information: [place] [address] [place] Follow up.   Why:  DME will be delivered to your room prior to discharge Contact information: [address] [place] [address] [phone]            Signed: Joanell Rising 06/30/2018, 3:39 PM

## 2018-06-30 NOTE — Progress Notes (Signed)
Physical Therapy Treatment Patient Details Name: [name] [name] MRN: [id number] DOB: [birth year] Today's Date: 06/30/2018    History of Present Illness Pt is an 84 y/o male s/p L TKA. PMH including but not limited to HTN, CAD s/p MI.    PT Comments    Pt making steady progress with functional mobility and tolerated stair training this session with no difficulties. Plan is for pt to d/c home today. Pt is ready to d/c home from PT perspective.   Pt would continue to benefit from skilled physical therapy services at this time while admitted and after d/c to address the below listed limitations in order to improve overall safety and independence with functional mobility.    Follow Up Recommendations  Home health PT;Supervision - Intermittent     Equipment Recommendations  Rolling walker with 5" wheels    Recommendations for Other Services       Precautions / Restrictions Precautions Precautions: Fall;Knee Precaution Booklet Issued: Yes (comment) Precaution Comments: PT reviewed positioning of LE following TKA surgery Restrictions Weight Bearing Restrictions: Yes LLE Weight Bearing: Weight bearing as tolerated    Mobility  Bed Mobility               General bed mobility comments: pt OOB in recliner chair upon arrival  Transfers Overall transfer level: Needs assistance Equipment used: Rolling walker (2 wheeled) Transfers: Sit to/from Stand Sit to Stand: Supervision         General transfer comment: good technique, supervision for safety  Ambulation/Gait Ambulation/Gait assistance: Supervision Gait Distance (Feet): 250 Feet Assistive device: Rolling walker (2 wheeled) Gait Pattern/deviations: Step-through pattern;Decreased step length - right;Decreased step length - left;Decreased stride length;Decreased stance time - left;Decreased weight shift to left Gait velocity: decreased Gait velocity interpretation: 1.31 - 2.62 ft/sec, indicative of limited community  ambulator General Gait Details: pt with mild instability but no overt LOB or need for physical assistance, cueing to maintain a safe distance from [employer] Stairs: Yes Stairs assistance: Min guard Stair Management: Two rails;Step to pattern;Forwards Number of Stairs: 2(x2 trials) General stair comments: cueing for sequencing, pt steady with use of rails   Wheelchair Mobility    Modified Rankin (Stroke Patients Only)       Balance Overall balance assessment: Needs assistance Sitting-balance support: Feet supported Sitting balance-Leahy Scale: Good     Standing balance support: During functional activity;Bilateral upper extremity supported Standing balance-Leahy Scale: Poor                              Cognition Arousal/Alertness: Awake/alert Behavior During Therapy: WFL for tasks assessed/performed Overall Cognitive Status: Within Functional Limits for tasks assessed                                        Exercises Total Joint Exercises Heel Slides: AROM;Strengthening;Left;10 reps;Seated Hip ABduction/ADduction: AROM;Strengthening;Left;10 reps;Seated Straight Leg Raises: AROM;Strengthening;Left;10 reps;Seated Goniometric ROM: Flexion = 80 degrees, Extension = lacking 20 degrees to neutral    General Comments        Pertinent Vitals/Pain Pain Assessment: Faces Pain Score: 4  Faces Pain Scale: Hurts a little bit Pain Location: L knee Pain Descriptors / Indicators: Sore Pain Intervention(s): Monitored during session;Repositioned    Home Living  Prior Function            PT Goals (current goals can now be found in the care plan section) Acute Rehab PT Goals Patient Stated Goal: play golf PT Goal Formulation: With patient Time For Goal Achievement: 07/13/18 Potential to Achieve Goals: Good Progress towards PT goals: Progressing toward goals    Frequency    7X/week      PT Plan Current  plan remains appropriate    Co-evaluation              AM-PAC PT "6 Clicks" Daily Activity  Outcome Measure  Difficulty turning over in bed (including adjusting bedclothes, sheets and blankets)?: None Difficulty moving from lying on back to sitting on the side of the bed? : None Difficulty sitting down on and standing up from a chair with arms (e.g., wheelchair, bedside commode, etc,.)?: Unable Help needed moving to and from a bed to chair (including a wheelchair)?: None Help needed walking in hospital room?: None Help needed climbing 3-5 steps with a railing? : A Little 6 Click Score: 20    End of Session Equipment Utilized During Treatment: Gait belt Activity Tolerance: Patient tolerated treatment well Patient left: in chair;with call bell/phone within reach Nurse Communication: Mobility status PT Visit Diagnosis: Other abnormalities of gait and mobility (R26.89);Pain Pain - Right/Left: Left Pain - part of body: Knee     Time: 1216-1227 PT Time Calculation (min) (ACUTE ONLY): 11 min  Charges:  $Gait Training: 8-22 mins                    G Codes:       [place], [place], [place] 06/30/2018, 12:55 PM

## 2018-06-30 NOTE — Progress Notes (Signed)
Pt requesting discharge. Educated pt that there was no note describing discharge but RN would call PA.   Spoke with PA Erik Phillips via telephone. PA stated if pt had met therapy goals he would put in discharge in an approximately 1-2 hours. Informed pt of same. Pt now unsure if he has a ride home as his wife has gone home. Pt stated he should just spend the night. Educated pt again on conversation with PA. Pt will attempt to call family to see if he can get a ride. Educated pt to call RN when he knows whether he has a ride home.  

## 2018-07-01 DIAGNOSIS — E669 Obesity, unspecified: Secondary | ICD-10-CM | POA: Diagnosis not present

## 2018-07-01 DIAGNOSIS — Z955 Presence of coronary angioplasty implant and graft: Secondary | ICD-10-CM | POA: Diagnosis not present

## 2018-07-01 DIAGNOSIS — N183 Chronic kidney disease, stage 3 (moderate): Secondary | ICD-10-CM | POA: Diagnosis not present

## 2018-07-01 DIAGNOSIS — I129 Hypertensive chronic kidney disease with stage 1 through stage 4 chronic kidney disease, or unspecified chronic kidney disease: Secondary | ICD-10-CM | POA: Diagnosis not present

## 2018-07-01 DIAGNOSIS — I2510 Atherosclerotic heart disease of native coronary artery without angina pectoris: Secondary | ICD-10-CM | POA: Diagnosis not present

## 2018-07-01 DIAGNOSIS — Z7902 Long term (current) use of antithrombotics/antiplatelets: Secondary | ICD-10-CM | POA: Diagnosis not present

## 2018-07-01 DIAGNOSIS — Z96652 Presence of left artificial knee joint: Secondary | ICD-10-CM | POA: Diagnosis not present

## 2018-07-01 DIAGNOSIS — I252 Old myocardial infarction: Secondary | ICD-10-CM | POA: Diagnosis not present

## 2018-07-01 DIAGNOSIS — Z87891 Personal history of nicotine dependence: Secondary | ICD-10-CM | POA: Diagnosis not present

## 2018-07-01 DIAGNOSIS — Z471 Aftercare following joint replacement surgery: Secondary | ICD-10-CM | POA: Diagnosis not present

## 2018-07-01 DIAGNOSIS — Z6833 Body mass index (BMI) 33.0-33.9, adult: Secondary | ICD-10-CM | POA: Diagnosis not present

## 2018-07-01 DIAGNOSIS — Z96641 Presence of right artificial hip joint: Secondary | ICD-10-CM | POA: Diagnosis not present

## 2018-07-01 DIAGNOSIS — K219 Gastro-esophageal reflux disease without esophagitis: Secondary | ICD-10-CM | POA: Diagnosis not present

## 2018-07-02 ENCOUNTER — Encounter (HOSPITAL_COMMUNITY): Payer: Self-pay | Admitting: Orthopedic Surgery

## 2018-07-02 NOTE — Anesthesia Postprocedure Evaluation (Signed)
Anesthesia Post Note  Patient: [name] [name]  Procedure(s) Performed: LEFT TOTAL KNEE ARTHROPLASTY (Left Knee)     Patient location during evaluation: PACU Anesthesia Type: Spinal Level of consciousness: awake and alert Pain management: pain level controlled Vital Signs Assessment: post-procedure vital signs reviewed and stable Respiratory status: spontaneous breathing Cardiovascular status: stable Anesthetic complications: no    Last Vitals:  Vitals:   06/30/18 0351 06/30/18 1609  BP: 126/63 (!) 165/75  Pulse: (!) 55 66  Resp: 16   Temp: 36.6 C 36.6 C  SpO2: 97% 98%    Last Pain:  Vitals:   06/30/18 1609  TempSrc: Oral  PainSc:                  Nolon Nations

## 2018-07-12 DIAGNOSIS — Z471 Aftercare following joint replacement surgery: Secondary | ICD-10-CM | POA: Diagnosis not present

## 2018-07-12 DIAGNOSIS — Z96652 Presence of left artificial knee joint: Secondary | ICD-10-CM | POA: Diagnosis not present

## 2018-07-12 DIAGNOSIS — M1712 Unilateral primary osteoarthritis, left knee: Secondary | ICD-10-CM | POA: Diagnosis not present

## 2018-07-18 DIAGNOSIS — Z6833 Body mass index (BMI) 33.0-33.9, adult: Secondary | ICD-10-CM | POA: Diagnosis not present

## 2018-07-18 DIAGNOSIS — I252 Old myocardial infarction: Secondary | ICD-10-CM | POA: Diagnosis not present

## 2018-07-18 DIAGNOSIS — Z96641 Presence of right artificial hip joint: Secondary | ICD-10-CM | POA: Diagnosis not present

## 2018-07-18 DIAGNOSIS — K219 Gastro-esophageal reflux disease without esophagitis: Secondary | ICD-10-CM | POA: Diagnosis not present

## 2018-07-18 DIAGNOSIS — Z96652 Presence of left artificial knee joint: Secondary | ICD-10-CM | POA: Diagnosis not present

## 2018-07-18 DIAGNOSIS — I129 Hypertensive chronic kidney disease with stage 1 through stage 4 chronic kidney disease, or unspecified chronic kidney disease: Secondary | ICD-10-CM | POA: Diagnosis not present

## 2018-07-18 DIAGNOSIS — Z7902 Long term (current) use of antithrombotics/antiplatelets: Secondary | ICD-10-CM | POA: Diagnosis not present

## 2018-07-18 DIAGNOSIS — N183 Chronic kidney disease, stage 3 (moderate): Secondary | ICD-10-CM | POA: Diagnosis not present

## 2018-07-18 DIAGNOSIS — Z87891 Personal history of nicotine dependence: Secondary | ICD-10-CM | POA: Diagnosis not present

## 2018-07-18 DIAGNOSIS — Z955 Presence of coronary angioplasty implant and graft: Secondary | ICD-10-CM | POA: Diagnosis not present

## 2018-07-18 DIAGNOSIS — E669 Obesity, unspecified: Secondary | ICD-10-CM | POA: Diagnosis not present

## 2018-07-18 DIAGNOSIS — Z471 Aftercare following joint replacement surgery: Secondary | ICD-10-CM | POA: Diagnosis not present

## 2018-07-18 DIAGNOSIS — I2510 Atherosclerotic heart disease of native coronary artery without angina pectoris: Secondary | ICD-10-CM | POA: Diagnosis not present

## 2018-07-23 DIAGNOSIS — M25662 Stiffness of left knee, not elsewhere classified: Secondary | ICD-10-CM | POA: Diagnosis not present

## 2018-07-23 DIAGNOSIS — Z96652 Presence of left artificial knee joint: Secondary | ICD-10-CM | POA: Diagnosis not present

## 2018-07-24 DIAGNOSIS — M25662 Stiffness of left knee, not elsewhere classified: Secondary | ICD-10-CM | POA: Diagnosis not present

## 2018-07-24 DIAGNOSIS — Z96652 Presence of left artificial knee joint: Secondary | ICD-10-CM | POA: Diagnosis not present

## 2018-07-31 DIAGNOSIS — Z96652 Presence of left artificial knee joint: Secondary | ICD-10-CM | POA: Diagnosis not present

## 2018-07-31 DIAGNOSIS — M25662 Stiffness of left knee, not elsewhere classified: Secondary | ICD-10-CM | POA: Diagnosis not present

## 2018-08-02 DIAGNOSIS — M25662 Stiffness of left knee, not elsewhere classified: Secondary | ICD-10-CM | POA: Diagnosis not present

## 2018-08-02 DIAGNOSIS — Z96652 Presence of left artificial knee joint: Secondary | ICD-10-CM | POA: Diagnosis not present

## 2018-08-03 DIAGNOSIS — Z96652 Presence of left artificial knee joint: Secondary | ICD-10-CM | POA: Diagnosis not present

## 2018-08-03 DIAGNOSIS — M25662 Stiffness of left knee, not elsewhere classified: Secondary | ICD-10-CM | POA: Diagnosis not present

## 2018-08-06 DIAGNOSIS — M25662 Stiffness of left knee, not elsewhere classified: Secondary | ICD-10-CM | POA: Diagnosis not present

## 2018-08-06 DIAGNOSIS — Z96652 Presence of left artificial knee joint: Secondary | ICD-10-CM | POA: Diagnosis not present

## 2018-08-08 DIAGNOSIS — Z96652 Presence of left artificial knee joint: Secondary | ICD-10-CM | POA: Diagnosis not present

## 2018-08-08 DIAGNOSIS — M25662 Stiffness of left knee, not elsewhere classified: Secondary | ICD-10-CM | POA: Diagnosis not present

## 2018-08-13 DIAGNOSIS — Z96652 Presence of left artificial knee joint: Secondary | ICD-10-CM | POA: Diagnosis not present

## 2018-08-13 DIAGNOSIS — M25662 Stiffness of left knee, not elsewhere classified: Secondary | ICD-10-CM | POA: Diagnosis not present

## 2018-08-15 DIAGNOSIS — Z96652 Presence of left artificial knee joint: Secondary | ICD-10-CM | POA: Diagnosis not present

## 2018-08-15 DIAGNOSIS — M25662 Stiffness of left knee, not elsewhere classified: Secondary | ICD-10-CM | POA: Diagnosis not present

## 2018-08-16 DIAGNOSIS — M25662 Stiffness of left knee, not elsewhere classified: Secondary | ICD-10-CM | POA: Diagnosis not present

## 2018-08-20 DIAGNOSIS — Z96652 Presence of left artificial knee joint: Secondary | ICD-10-CM | POA: Diagnosis not present

## 2018-08-20 DIAGNOSIS — M25662 Stiffness of left knee, not elsewhere classified: Secondary | ICD-10-CM | POA: Diagnosis not present

## 2018-08-22 DIAGNOSIS — Z96652 Presence of left artificial knee joint: Secondary | ICD-10-CM | POA: Diagnosis not present

## 2018-08-22 DIAGNOSIS — M25662 Stiffness of left knee, not elsewhere classified: Secondary | ICD-10-CM | POA: Diagnosis not present

## 2018-08-23 DIAGNOSIS — L570 Actinic keratosis: Secondary | ICD-10-CM | POA: Diagnosis not present

## 2018-08-23 DIAGNOSIS — C44319 Basal cell carcinoma of skin of other parts of face: Secondary | ICD-10-CM | POA: Diagnosis not present

## 2018-08-23 DIAGNOSIS — L820 Inflamed seborrheic keratosis: Secondary | ICD-10-CM | POA: Diagnosis not present

## 2018-08-23 DIAGNOSIS — X32XXXD Exposure to sunlight, subsequent encounter: Secondary | ICD-10-CM | POA: Diagnosis not present

## 2018-08-28 DIAGNOSIS — M25662 Stiffness of left knee, not elsewhere classified: Secondary | ICD-10-CM | POA: Diagnosis not present

## 2018-08-28 DIAGNOSIS — Z96652 Presence of left artificial knee joint: Secondary | ICD-10-CM | POA: Diagnosis not present

## 2018-08-28 DIAGNOSIS — N183 Chronic kidney disease, stage 3 (moderate): Secondary | ICD-10-CM | POA: Diagnosis not present

## 2018-08-28 DIAGNOSIS — I10 Essential (primary) hypertension: Secondary | ICD-10-CM | POA: Diagnosis not present

## 2018-08-28 DIAGNOSIS — R739 Hyperglycemia, unspecified: Secondary | ICD-10-CM | POA: Diagnosis not present

## 2018-08-28 DIAGNOSIS — D696 Thrombocytopenia, unspecified: Secondary | ICD-10-CM | POA: Diagnosis not present

## 2018-08-28 DIAGNOSIS — E538 Deficiency of other specified B group vitamins: Secondary | ICD-10-CM | POA: Diagnosis not present

## 2018-08-30 DIAGNOSIS — N183 Chronic kidney disease, stage 3 (moderate): Secondary | ICD-10-CM | POA: Diagnosis not present

## 2018-08-30 DIAGNOSIS — E7849 Other hyperlipidemia: Secondary | ICD-10-CM | POA: Diagnosis not present

## 2018-08-30 DIAGNOSIS — E559 Vitamin D deficiency, unspecified: Secondary | ICD-10-CM | POA: Diagnosis not present

## 2018-08-30 DIAGNOSIS — R739 Hyperglycemia, unspecified: Secondary | ICD-10-CM | POA: Diagnosis not present

## 2018-08-30 DIAGNOSIS — I77811 Abdominal aortic ectasia: Secondary | ICD-10-CM | POA: Diagnosis not present

## 2018-08-30 DIAGNOSIS — E538 Deficiency of other specified B group vitamins: Secondary | ICD-10-CM | POA: Diagnosis not present

## 2018-08-30 DIAGNOSIS — D692 Other nonthrombocytopenic purpura: Secondary | ICD-10-CM | POA: Diagnosis not present

## 2018-08-30 DIAGNOSIS — I25118 Atherosclerotic heart disease of native coronary artery with other forms of angina pectoris: Secondary | ICD-10-CM | POA: Diagnosis not present

## 2018-08-30 DIAGNOSIS — I10 Essential (primary) hypertension: Secondary | ICD-10-CM | POA: Diagnosis not present

## 2018-08-30 DIAGNOSIS — M810 Age-related osteoporosis without current pathological fracture: Secondary | ICD-10-CM | POA: Diagnosis not present

## 2018-08-30 DIAGNOSIS — I5022 Chronic systolic (congestive) heart failure: Secondary | ICD-10-CM | POA: Diagnosis not present

## 2018-08-30 DIAGNOSIS — D696 Thrombocytopenia, unspecified: Secondary | ICD-10-CM | POA: Diagnosis not present

## 2018-08-31 DIAGNOSIS — Z96652 Presence of left artificial knee joint: Secondary | ICD-10-CM | POA: Diagnosis not present

## 2018-08-31 DIAGNOSIS — M25662 Stiffness of left knee, not elsewhere classified: Secondary | ICD-10-CM | POA: Diagnosis not present

## 2018-09-03 DIAGNOSIS — M25662 Stiffness of left knee, not elsewhere classified: Secondary | ICD-10-CM | POA: Diagnosis not present

## 2018-09-03 DIAGNOSIS — Z96652 Presence of left artificial knee joint: Secondary | ICD-10-CM | POA: Diagnosis not present

## 2018-09-05 DIAGNOSIS — M25662 Stiffness of left knee, not elsewhere classified: Secondary | ICD-10-CM | POA: Diagnosis not present

## 2018-09-05 DIAGNOSIS — Z96652 Presence of left artificial knee joint: Secondary | ICD-10-CM | POA: Diagnosis not present

## 2018-09-10 DIAGNOSIS — Z96652 Presence of left artificial knee joint: Secondary | ICD-10-CM | POA: Diagnosis not present

## 2018-09-10 DIAGNOSIS — M25662 Stiffness of left knee, not elsewhere classified: Secondary | ICD-10-CM | POA: Diagnosis not present

## 2018-09-11 DIAGNOSIS — N183 Chronic kidney disease, stage 3 (moderate): Secondary | ICD-10-CM | POA: Diagnosis not present

## 2018-09-11 DIAGNOSIS — J40 Bronchitis, not specified as acute or chronic: Secondary | ICD-10-CM | POA: Diagnosis not present

## 2018-09-13 DIAGNOSIS — M25662 Stiffness of left knee, not elsewhere classified: Secondary | ICD-10-CM | POA: Diagnosis not present

## 2018-09-13 DIAGNOSIS — Z96652 Presence of left artificial knee joint: Secondary | ICD-10-CM | POA: Diagnosis not present

## 2018-12-04 DIAGNOSIS — M25562 Pain in left knee: Secondary | ICD-10-CM | POA: Diagnosis not present

## 2018-12-04 DIAGNOSIS — M25551 Pain in right hip: Secondary | ICD-10-CM | POA: Diagnosis not present

## 2019-02-26 DIAGNOSIS — E538 Deficiency of other specified B group vitamins: Secondary | ICD-10-CM | POA: Diagnosis not present

## 2019-02-26 DIAGNOSIS — E7849 Other hyperlipidemia: Secondary | ICD-10-CM | POA: Diagnosis not present

## 2019-02-26 DIAGNOSIS — D696 Thrombocytopenia, unspecified: Secondary | ICD-10-CM | POA: Diagnosis not present

## 2019-02-26 DIAGNOSIS — I10 Essential (primary) hypertension: Secondary | ICD-10-CM | POA: Diagnosis not present

## 2019-02-26 DIAGNOSIS — R739 Hyperglycemia, unspecified: Secondary | ICD-10-CM | POA: Diagnosis not present

## 2019-02-26 DIAGNOSIS — N183 Chronic kidney disease, stage 3 (moderate): Secondary | ICD-10-CM | POA: Diagnosis not present

## 2019-03-05 ENCOUNTER — Other Ambulatory Visit
Admission: RE | Admit: 2019-03-05 | Discharge: 2019-03-05 | Disposition: A | Discharge status: Home / Self Care | Payer: PPO | Source: Ambulatory Visit | Attending: Internal Medicine | Admitting: Internal Medicine

## 2019-03-05 ENCOUNTER — Telehealth: Payer: Self-pay | Admitting: Cardiovascular Disease

## 2019-03-05 DIAGNOSIS — N183 Chronic kidney disease, stage 3 (moderate): Secondary | ICD-10-CM | POA: Diagnosis not present

## 2019-03-05 DIAGNOSIS — I10 Essential (primary) hypertension: Secondary | ICD-10-CM | POA: Insufficient documentation

## 2019-03-05 DIAGNOSIS — R739 Hyperglycemia, unspecified: Secondary | ICD-10-CM | POA: Diagnosis not present

## 2019-03-05 DIAGNOSIS — D692 Other nonthrombocytopenic purpura: Secondary | ICD-10-CM | POA: Insufficient documentation

## 2019-03-05 DIAGNOSIS — R0609 Other forms of dyspnea: Secondary | ICD-10-CM | POA: Diagnosis not present

## 2019-03-05 DIAGNOSIS — R0600 Dyspnea, unspecified: Secondary | ICD-10-CM | POA: Diagnosis not present

## 2019-03-05 DIAGNOSIS — I25118 Atherosclerotic heart disease of native coronary artery with other forms of angina pectoris: Secondary | ICD-10-CM | POA: Insufficient documentation

## 2019-03-05 DIAGNOSIS — E559 Vitamin D deficiency, unspecified: Secondary | ICD-10-CM | POA: Diagnosis not present

## 2019-03-05 DIAGNOSIS — I5022 Chronic systolic (congestive) heart failure: Secondary | ICD-10-CM | POA: Diagnosis not present

## 2019-03-05 DIAGNOSIS — I77811 Abdominal aortic ectasia: Secondary | ICD-10-CM | POA: Diagnosis not present

## 2019-03-05 DIAGNOSIS — E7849 Other hyperlipidemia: Secondary | ICD-10-CM | POA: Diagnosis not present

## 2019-03-05 DIAGNOSIS — M810 Age-related osteoporosis without current pathological fracture: Secondary | ICD-10-CM | POA: Diagnosis not present

## 2019-03-05 DIAGNOSIS — D696 Thrombocytopenia, unspecified: Secondary | ICD-10-CM | POA: Diagnosis not present

## 2019-03-05 LAB — TROPONIN I: TROPONIN I: 0.03 ng/mL — AB (ref <0.03)

## 2019-03-05 LAB — BRAIN NATRIURETIC PEPTIDE: B Natriuretic Peptide: 273 pg/mL — ABNORMAL HIGH (ref 0.0–100.0)

## 2019-03-05 NOTE — Telephone Encounter (Signed)
[facility] - Dr Aquilla Hacker office calling  States that PCP saw patient this morning and patient has been having issues with SOB as well as high BP - in the 200s They will be faxing over referral notes but want patient to be seen ASAP Patient was scheduled next available on 4/13 with R. Dunn Please call patient to discuss and for possible sooner appointment

## 2019-03-05 NOTE — Telephone Encounter (Signed)
Dr Olin Pia office at [facility] calling back Stated that they are aware patient is being seen on 3/16 but wants patient to be seen sooner, like tomorrow - made office aware Dr Rockey Situ is not in office tomorrow  Please advise

## 2019-03-05 NOTE — Telephone Encounter (Signed)
It appears patient refused to see anyone but Dr Rockey Situ from previous entries. Called Dr Eustace Moore Klein's office at [facility] and spoke with [facility]. They were under the impression that we could not offer appointment. SHe verbalized understanding that patient was refusing to see anyone but Dr Rockey Situ. Let her know that he is not in the office this week but in the hospital covering. She said patient's troponin and BNP are elevated from office visit today as well. She will make Dr Caryl Comes aware for next steps for patient.

## 2019-03-05 NOTE — Progress Notes (Deleted)
Cardiology Office Note Date:  03/05/2019  Patient ID:  Izaac, Reisig 1931-06-22, MRN [id number] PCP:  [name], MD  Cardiologist:  Dr. Rockey Situ, MD  ***refresh   Chief Complaint: Chest pain and SOB  History of Present Illness: SHIV SHUEY is a 85 y.o. male with history of CAD as outlined below, HFrEF secondary ICM, prior tobacco abuse quitting in 1969, CKD stage III, HTN, HLD, obesity, LBBB, asymptomatic bradycardia, prior right hip replacement, and thrombocytopenia who presents for evaluation of chest pain and SOB.  Patient previously underwent PCI/DES to the mid LAD in 2006. He was subsequently admitted with an anterior ST elevation MI in 2008 secondary to ISR with successful PCI/BMS, possibly in the setting of stopping Plavix. Most recent cath from 09/2011 showed left main normal, proximal LAD 30% stenosis, 2 overlapping stents in the mid LAD with no restenosis, moderate sized diagonal branch that was jailed by the LAD stents that appeared unchanged when compared to the cath in 2008 with ostial 95% stenosis with good blood flow down the branch, OM2 95% stenosis s/p PCI/DES, mild diffuse RCA plaque, EF 30-35% by LV gram with hypokinesis of the anterolateral, apex, and inferoapical wall. He was admitted in 2016 with GI discomfort and chest pain. He ruled out. Echo showed an EF of 45-50% with apical hypokinesis. Follow up stress test in 07/2015 showed a fixed defect with old anterior MI without ischemia. Lisinopril has previously been changed to losartan secondary to cough. He was most recently seen in 05/2018 prior to knee surgery and was doing well from a cardiac perspective.   Patient was seen by PCP on 03/05/2019 with increasing weights, and BP > 540 mmHg systolic with associated exertional SOB and discomfort across his shoulders as well as chest tightness. He had been eating much more salt and was not compliant with his Coreg or Lasix. Documented weight was 221 pounds. It appears  troponin and BNP were ordered and phone notes indicate these tests were elevated. Phone note states the troponin was 0.03, though I cannot actually see the test result. I am unable to see what the BNP was, just a phone note that states it was elevated. CXR appears to have been ordered, though I do not see where this was completed. It was advised the patient follow up with cardiology as an outpatient given his refusal to go to the ED.   ***   Past Medical History:  Diagnosis Date  . Arthritis   . CAD (coronary artery disease) 10/21/2011   s/p stent placement  . Dyspnea   . Hyperlipidemia   . Hypertension   . Myocardial infarction [facility]) 2008 & 2013  . Renal insufficiency, mild   . Sinus bradycardia    Assymptomatic  . Tobacco user    Remote    Past Surgical History:  Procedure Laterality Date  . CARDIAC CATHETERIZATION  10/21/2011   s/p stent placement  . CARDIAC CATHETERIZATION  2008   stent placement  . CARDIAC CATHETERIZATION  2006   stent placement  . CARPAL TUNNEL RELEASE Right 07/22/2016   Procedure: CARPAL TUNNEL RELEASE;  Surgeon: Leanor Kail, MD;  Location: [facility] ORS;  Service: Orthopedics;  Laterality: Right;  . JOINT REPLACEMENT    . TOTAL HIP ARTHROPLASTY     Right  . TOTAL KNEE ARTHROPLASTY Left 06/29/2018   Procedure: LEFT TOTAL KNEE ARTHROPLASTY;  Surgeon: Frederik Pear, MD;  Location: [place];  Service: Orthopedics;  Laterality: Left;  . TRANSURETHRAL RESECTION  OF PROSTATE      No outpatient medications have been marked as taking for the 03/06/19 encounter (Appointment) with Rise Mu, PA-C.    Allergies:   Amoxicillin   Social History:  The patient  reports that he quit smoking about 50 years ago. His smoking use included cigarettes. He has a 20.00 pack-year smoking history. He has never used smokeless tobacco. He reports that he does not drink alcohol or use drugs.   Family History:  The patient's Family history is unknown by patient.  ROS:   ROS    PHYSICAL EXAM: *** VS:  There were no vitals taken for this visit. BMI: There is no height or weight on file to calculate BMI.  Physical Exam   EKG:  Was ordered and interpreted by me today. Shows ***  Recent Labs: 06/30/2018: BUN 32; Creatinine, Ser 2.18; Hemoglobin 11.8; Platelets 123; Potassium 5.7; Sodium 134 03/05/2019: B Natriuretic Peptide 273.0  No results found for requested labs within last 8760 hours.   CrCl cannot be calculated (Patient's most recent lab result is older than the maximum 21 days allowed.).   Wt Readings from Last 3 Encounters:  06/20/18 206 lb 8 oz (93.7 kg)  06/07/18 205 lb 4 oz (93.1 kg)  05/01/17 220 lb (99.8 kg)     Other studies reviewed: Additional studies/records reviewed today include: summarized above  ASSESSMENT AND PLAN:  1. ***  Disposition: F/u with Dr. Rockey Situ or an APP in ***   Current medicines are reviewed at length with the patient today.  The patient did not have any concerns regarding medicines.  Signed, Christell Faith, PA-C 03/05/2019 6:34 PM     [address] [place], [place] [address] [phone]

## 2019-03-05 NOTE — Telephone Encounter (Signed)
Pt c/o of Chest Pain: STAT if CP now or developed within 24 hours  1. Are you having CP right now? No   2. Are you experiencing any other symptoms (ex. SOB, nausea, vomiting, sweating)?   Sob and cp on exertion patient states tightness radiates down arm this week bp 220/98  170/70   3. How long have you been experiencing CP? Quite a while   4. Is your CP continuous or coming and going?  Interim on exertion   5. Have you taken Nitroglycerin? No  ?   Patient states Dr. Ramonita Lab rxd Amlodipine 5 mg po q d and to see Dr. Rockey Situ asap .  Patient declined appt with APP and also declined appt in April .  Please call to discuss .

## 2019-03-05 NOTE — Telephone Encounter (Addendum)
Triage message received. Contacted the pt to move his appt up after receiving a call from [place] @ [facility] for the pt to be seen asap. The pt would only like to see Dr.Gollan. Appt rescheduled for 03/11/19 @ 3pm with Dr.Gollan. Adv the pt to seek emergency if symptoms worsen. Pt verbalized understanding and voiced appreciation for the assistance.

## 2019-03-06 ENCOUNTER — Emergency Department: Payer: PPO

## 2019-03-06 ENCOUNTER — Telehealth: Payer: Self-pay | Admitting: *Deleted

## 2019-03-06 ENCOUNTER — Ambulatory Visit: Payer: PPO | Admitting: Physician Assistant

## 2019-03-06 ENCOUNTER — Telehealth: Payer: Self-pay | Admitting: Cardiovascular Disease

## 2019-03-06 ENCOUNTER — Emergency Department
Admission: EM | Admit: 2019-03-06 | Discharge: 2019-03-06 | Disposition: A | Discharge status: Home / Self Care | Payer: PPO | Attending: Emergency Medicine | Admitting: Emergency Medicine

## 2019-03-06 ENCOUNTER — Other Ambulatory Visit: Payer: Self-pay

## 2019-03-06 ENCOUNTER — Encounter: Payer: Self-pay | Admitting: Emergency Medicine

## 2019-03-06 DIAGNOSIS — Z79899 Other long term (current) drug therapy: Secondary | ICD-10-CM | POA: Insufficient documentation

## 2019-03-06 DIAGNOSIS — I252 Old myocardial infarction: Secondary | ICD-10-CM | POA: Insufficient documentation

## 2019-03-06 DIAGNOSIS — Z96641 Presence of right artificial hip joint: Secondary | ICD-10-CM | POA: Diagnosis not present

## 2019-03-06 DIAGNOSIS — I2510 Atherosclerotic heart disease of native coronary artery without angina pectoris: Secondary | ICD-10-CM | POA: Diagnosis not present

## 2019-03-06 DIAGNOSIS — R079 Chest pain, unspecified: Secondary | ICD-10-CM

## 2019-03-06 DIAGNOSIS — Z7982 Long term (current) use of aspirin: Secondary | ICD-10-CM | POA: Insufficient documentation

## 2019-03-06 DIAGNOSIS — I208 Other forms of angina pectoris: Secondary | ICD-10-CM | POA: Diagnosis not present

## 2019-03-06 DIAGNOSIS — R0602 Shortness of breath: Secondary | ICD-10-CM | POA: Diagnosis not present

## 2019-03-06 DIAGNOSIS — I10 Essential (primary) hypertension: Secondary | ICD-10-CM | POA: Insufficient documentation

## 2019-03-06 DIAGNOSIS — Z87891 Personal history of nicotine dependence: Secondary | ICD-10-CM | POA: Diagnosis not present

## 2019-03-06 DIAGNOSIS — I25119 Atherosclerotic heart disease of native coronary artery with unspecified angina pectoris: Secondary | ICD-10-CM

## 2019-03-06 LAB — CBC
HCT: 46.3 % (ref 39.0–52.0)
Hemoglobin: 15.6 g/dL (ref 13.0–17.0)
MCH: 33.8 pg (ref 26.0–34.0)
MCHC: 33.7 g/dL (ref 30.0–36.0)
MCV: 100.2 fL — ABNORMAL HIGH (ref 80.0–100.0)
PLATELETS: 138 10*3/uL — AB (ref 150–400)
RBC: 4.62 MIL/uL (ref 4.22–5.81)
RDW: 13.2 % (ref 11.5–15.5)
WBC: 6.3 10*3/uL (ref 4.0–10.5)
nRBC: 0 % (ref 0.0–0.2)

## 2019-03-06 LAB — BASIC METABOLIC PANEL
Anion gap: 7 (ref 5–15)
BUN: 25 mg/dL — ABNORMAL HIGH (ref 8–23)
CHLORIDE: 107 mmol/L (ref 98–111)
CO2: 24 mmol/L (ref 22–32)
Calcium: 9 mg/dL (ref 8.9–10.3)
Creatinine, Ser: 1.51 mg/dL — ABNORMAL HIGH (ref 0.61–1.24)
GFR calc Af Amer: 48 mL/min — ABNORMAL LOW (ref >60)
GFR calc non Af Amer: 41 mL/min — ABNORMAL LOW (ref >60)
Glucose, Bld: 116 mg/dL — ABNORMAL HIGH (ref 70–99)
Potassium: 4.7 mmol/L (ref 3.5–5.1)
Sodium: 138 mmol/L (ref 135–145)

## 2019-03-06 LAB — TROPONIN I: Troponin I: 0.03 ng/mL (ref <0.03)

## 2019-03-06 MED ORDER — SODIUM CHLORIDE 0.9% FLUSH: 3.0000 mL | Freq: Once | INTRAVENOUS | Status: DC

## 2019-03-06 NOTE — ED Triage Notes (Signed)
Pt to ED via POV c/o chest tightness and shortness of breath. Pt states that his PCP took blood work yesterday and called him today and told him that he needed to come to the ED. Pt is supposed to see cardiology today at noon. Pt states that he has hx/o MI in the past. Pt is able to speak in complete sentences at this time and does not appear to be in any distress.

## 2019-03-06 NOTE — Telephone Encounter (Addendum)
Secure chat message received from Dr. Rockey Situ that the patient is currently in the ER for evaluation of chest pain/ known CAD. Troponin stable and he should go home today. He would like [place] to arrange for the patient to have a lexiscan myoview this week if possible.   Order entered.   Lexiscan scheduled for Friday 03/08/19 at 7:30 am.  Will call the patient after discharge to discuss appt date/ time/ details.  [place]  Your caregiver has ordered a Stress Test with nuclear imaging. The purpose of this test is to evaluate the blood supply to your heart muscle. This procedure is referred to as a "Non-Invasive Stress Test." This is because other than having an IV started in your vein, nothing is inserted or "invades" your body. Cardiac stress tests are done to find areas of poor blood flow to the heart by determining the extent of coronary artery disease (CAD). Some patients exercise on a treadmill, which naturally increases the blood flow to your heart, while others who are  unable to walk on a treadmill due to physical limitations have a pharmacologic/chemical stress agent called Lexiscan . This medicine will mimic walking on a treadmill by temporarily increasing your coronary blood flow.   Please note: these test may take anywhere between 2-4 hours to complete  PLEASE REPORT TO [facility] AT THE FIRST DESK WILL DIRECT YOU WHERE TO GO  Date of Procedure: Friday 03/08/19  Arrival Time for Procedure: 7:15 am  Instructions regarding medication:   ____ : Hold diabetes medication morning of procedure  __x__:  Hold betablocker(s) night before procedure and morning of procedure (coreg)  ____:  Hold other medications as follows: lasix the morning of your procedure  PLEASE NOTIFY THE OFFICE AT LEAST 24 HOURS IN ADVANCE IF YOU ARE UNABLE TO KEEP YOUR APPOINTMENT.  [phone] AND  PLEASE NOTIFY NUCLEAR MEDICINE AT [facility] AT LEAST 24 HOURS IN ADVANCE IF YOU ARE  UNABLE TO KEEP YOUR APPOINTMENT. [phone]  How to prepare for your Myoview test:  1. Do not eat or drink after midnight 2. No caffeine for 24 hours prior to test 3. No smoking 24 hours prior to test. 4. Your medication may be taken with water.  If your doctor stopped a medication because of this test, do not take that medication. 5. Ladies, please do not wear dresses.  Skirts or pants are appropriate. Please wear a short sleeve shirt. 6. No perfume, cologne or lotion. 7. Wear comfortable walking shoes. No heels!

## 2019-03-06 NOTE — Telephone Encounter (Signed)
I spoke with the patient regarding his lexiscan scheduled for Friday 03/08/19 at 7:30 am. The patient voices understanding of the date/ time/ verbal pre-test instructions. He is agreeable with the above. He is aware that his appt with Dr. Rockey Situ on 3/16 was r/s today, so he does not have that appt any longer. I advised Dr. Rockey Situ will review his myoview results and determine follow up at that time.  He is agreeable.

## 2019-03-06 NOTE — Telephone Encounter (Signed)
Christell Faith, PA was to see patient today. He reviewed patient's chart and recent abnormal lab work. He advised me to contact patient and advise him to proceed to the ER this morning.  Phoned patient and he verbalized understanding that he should go to the ED this morning. He said he would go.  Lewayne Bunting, RN, in the ER to let her know patient would be coming by private vehicle.

## 2019-03-06 NOTE — ED Provider Notes (Signed)
[facility] Emergency Department Provider Note   ____________________________________________    I have reviewed the triage vital signs and the nursing notes.   HISTORY  Chief Complaint Chest Pain     HPI [name] [name] is a 85 y.o. male who presents with complaints of chest discomfort.  Patient reports over the last month he has had chest tightness with significant exertion.  Does have a history of coronary artery disease including a stent placement in the past.  He saw his PCP yesterday who ordered labs and referred him to cardiology.  Cardiology PA apparently referred him to the emergency department when labs resulted with a troponin of 0.03.  Patient is chest pain-free at this time, no shortness of breath.  No pleurisy.  Past Medical History:  Diagnosis Date  . Arthritis   . CAD (coronary artery disease) 10/21/2011   s/p stent placement  . Dyspnea   . Hyperlipidemia   . Hypertension   . Myocardial infarction [facility]) 2008 & 2013  . Renal insufficiency, mild   . Sinus bradycardia    Assymptomatic  . Tobacco user    Remote    Patient Active Problem List   Diagnosis Date Noted  . Total knee replacement status, left 06/30/2018  . Primary osteoarthritis of left knee 06/29/2018  . Degenerative arthritis of left knee 06/27/2018  . GERD (gastroesophageal reflux disease) 09/21/2015  . Chronic renal failure, stage 3 (moderate) ([place]) 09/21/2015  . Pain in the chest   . Shortness of breath 03/25/2015  . Hyperlipidemia 10/07/2009  . HYPERTENSION, BENIGN 10/07/2009  . OLD MYOCARDIAL INFARCTION 10/07/2009  . CAD, NATIVE VESSEL 10/07/2009    Past Surgical History:  Procedure Laterality Date  . CARDIAC CATHETERIZATION  10/21/2011   s/p stent placement  . CARDIAC CATHETERIZATION  2008   stent placement  . CARDIAC CATHETERIZATION  2006   stent placement  . CARPAL TUNNEL RELEASE Right 07/22/2016   Procedure: CARPAL TUNNEL RELEASE;  Surgeon: Leanor Kail, MD;  Location: [facility] ORS;  Service: Orthopedics;  Laterality: Right;  . JOINT REPLACEMENT    . TOTAL HIP ARTHROPLASTY     Right  . TOTAL KNEE ARTHROPLASTY Left 06/29/2018   Procedure: LEFT TOTAL KNEE ARTHROPLASTY;  Surgeon: Frederik Pear, MD;  Location: [facility];  Service: Orthopedics;  Laterality: Left;  . TRANSURETHRAL RESECTION OF PROSTATE      Prior to Admission medications   Medication Sig Start Date End Date Taking? Authorizing Provider  amLODipine (NORVASC) 5 MG tablet Take 5 mg by mouth daily. 03/05/19 03/04/20 Yes [provider]  azelastine (ASTELIN) 0.1 % nasal spray Place 1 spray into the nose 2 (two) times daily. 08/30/18 08/30/19 Yes [provider]  aspirin EC 81 MG tablet Take 1 tablet (81 mg total) by mouth 2 (two) times daily. 06/29/18   Leighton Parody, PA-C  carvedilol (COREG) 3.125 MG tablet Take 3.125 mg by mouth 2 (two) times daily with a meal.    [provider]  clopidogrel (PLAVIX) 75 MG tablet Take 75 mg by mouth daily.      [provider]  furosemide (LASIX) 40 MG tablet Take 40 mg by mouth daily as needed (for fluid retention.).  08/17/15   [provider]  losartan (COZAAR) 100 MG tablet Take 100 mg by mouth daily. 11/09/18   [provider]  losartan (COZAAR) 50 MG tablet Take 50 mg by mouth daily at 2 PM. In the afternoon    [provider]  lovastatin (MEVACOR) 40 MG tablet Take 40 mg by mouth at bedtime.    [provider]  nitroGLYCERIN (NITROSTAT) 0.4 MG SL tablet Place 0.4 mg under the tongue every 5 (five) minutes as needed for chest pain.    [provider]  oxyCODONE-acetaminophen (PERCOCET/ROXICET) 5-325 MG tablet Take 1 tablet by mouth every 4 (four) hours as needed for severe pain. 06/29/18   Leighton Parody, PA-C  tiZANidine (ZANAFLEX) 2 MG tablet Take 1 tablet (2 mg total) by mouth every 6 (six) hours as needed. 06/29/18   Leighton Parody, PA-C     Allergies Amoxicillin   Family History  Family history unknown: Yes    Social History Social History   Tobacco Use  . Smoking status: Former Smoker    Packs/day: 1.00    Years: 20.00    Pack years: 20.00    Types: Cigarettes    Last attempt to quit: 12/06/1968    Years since quitting: 50.2  . Smokeless tobacco: Never Used  Substance Use Topics  . Alcohol use: No  . Drug use: No    Review of Systems  Constitutional: No fever/chills Eyes: No visual changes.  ENT: No sore throat. Cardiovascular: As above Respiratory: Denies shortness of breath. Gastrointestinal: No abdominal pain.  Genitourinary: Negative for dysuria. Musculoskeletal: Negative for back pain. Skin: Negative for rash. Neurological: Negative for headaches   ____________________________________________   PHYSICAL EXAM:  VITAL SIGNS: ED Triage Vitals  Enc Vitals Group     BP 03/06/19 0951 (!) 161/66     Pulse Rate 03/06/19 0951 62     Resp 03/06/19 0951 16     Temp 03/06/19 0951 98.3 F (36.8 C)     Temp Source 03/06/19 0951 Oral     SpO2 03/06/19 0951 97 %     Weight 03/06/19 0949 99.8 kg (220 lb)     Height 03/06/19 0949 1.676 m (5\' 6" )     Head Circumference --      Peak Flow --      Pain Score 03/06/19 0949 0     Pain Loc --      Pain Edu? --      Excl. in [place]? --     Constitutional: Alert and oriented.  Eyes: Conjunctivae are normal.   Nose: No congestion/rhinnorhea. Mouth/Throat: Mucous membranes are moist.    Cardiovascular: Normal rate, regular rhythm. Grossly normal heart sounds.  Good peripheral circulation. Respiratory: Normal respiratory effort.  No retractions. Lungs CTAB. Gastrointestinal: Soft and nontender. No distention.   Musculoskeletal:  Warm and well perfused Neurologic:  Normal speech and language. No gross focal neurologic deficits are appreciated.  Skin:  Skin is warm, dry and intact. No rash noted. Psychiatric: Mood and affect are normal. Speech and behavior are normal.   ____________________________________________   LABS (all labs ordered are listed, but only abnormal results are displayed)  Labs Reviewed  BASIC METABOLIC PANEL - Abnormal; Notable for the following components:      Result Value   Glucose, Bld 116 (*)    BUN 25 (*)    Creatinine, Ser 1.51 (*)    GFR calc non Af Amer 41 (*)    GFR calc Af Amer 48 (*)    All other components within normal limits  CBC - Abnormal; Notable for the following components:   MCV 100.2 (*)    Platelets 138 (*)    All other components within normal limits  TROPONIN I - Abnormal;  Notable for the following components:   Troponin I 0.03 (*)    All other components within normal limits   ____________________________________________  EKG ED ECG REPORT I, Lavonia Drafts, the attending physician, personally viewed and interpreted this ECG.  Date: 03/06/2019  Rhythm: normal sinus rhythm QRS Axis: Abnormal Intervals: Abnormal ST/T Wave abnormalities: Nonspecific changes Narrative Interpretation: Unchanged from prior  ____________________________________________  RADIOLOGY  Chest x-ray unremarkable ____________________________________________   PROCEDURES  Procedure(s) performed: No  Procedures   Critical Care performed: No ____________________________________________   INITIAL IMPRESSION / ASSESSMENT AND PLAN / ED COURSE  Pertinent labs & imaging results that were available during my care of the patient were reviewed by me and considered in my medical decision making (see chart for details).  Patient's repeat troponin today is unchanged, he is chest pain-free in the emergency department.  EKG is unchanged.  He is already scheduled for follow-up with cardiology.  I discussed with Dr. Rockey Situ who states they will arrange for outpatient stress testing for the patient and no need for admission at this time which I agree with.  The patient was given strict return precautions     ____________________________________________   FINAL CLINICAL IMPRESSION(S) / ED DIAGNOSES  Final diagnoses:  Angina of effort [facility])        Note:  This document was prepared using Dragon voice recognition software and may include unintentional dictation errors.   Lavonia Drafts, MD 03/06/19 1300

## 2019-03-06 NOTE — ED Notes (Signed)
First Nurse Note:  Troponin 0.03, received from [place] in Lab.  [place] RN aware, patient to go to Rm 26.

## 2019-03-06 NOTE — ED Notes (Signed)
First Nurse Note: Patient to Rm 26, connected to monitor.  [place] RN aware of room placement.

## 2019-03-08 ENCOUNTER — Encounter
Admission: RE | Admit: 2019-03-08 | Discharge: 2019-03-08 | Disposition: A | Discharge status: Home / Self Care | Payer: PPO | Source: Ambulatory Visit | Attending: Cardiovascular Disease | Admitting: Cardiovascular Disease

## 2019-03-08 ENCOUNTER — Other Ambulatory Visit: Payer: Self-pay

## 2019-03-08 DIAGNOSIS — R079 Chest pain, unspecified: Secondary | ICD-10-CM

## 2019-03-08 DIAGNOSIS — I25119 Atherosclerotic heart disease of native coronary artery with unspecified angina pectoris: Secondary | ICD-10-CM

## 2019-03-08 LAB — NM MYOCAR MULTI W/SPECT W/WALL MOTION / EF
Estimated workload: 1 METS
Exercise duration (min): 0 min
Exercise duration (sec): 0 s
LVDIAVOL: 196 mL (ref 62–150)
LVSYSVOL: 111 mL
MPHR: 134 {beats}/min
Peak HR: 74 {beats}/min
Percent HR: 55 %
Rest HR: 56 {beats}/min
SDS: 2
SRS: 37
SSS: 30
TID: 1.09

## 2019-03-08 MED ORDER — TECHNETIUM TC 99M TETROFOSMIN IV KIT
10.0000 | PACK | Freq: Once | INTRAVENOUS | Status: AC | PRN
  Administered 2019-03-08: 10.95 via INTRAVENOUS

## 2019-03-08 MED ORDER — REGADENOSON 0.4 MG/5ML IV SOLN
0.4000 mg | Freq: Once | INTRAVENOUS | Status: AC
  Administered 2019-03-08: 0.4 mg via INTRAVENOUS
  Filled 2019-03-08: qty 5

## 2019-03-08 MED ORDER — TECHNETIUM TC 99M TETROFOSMIN IV KIT
30.1260 | PACK | Freq: Once | INTRAVENOUS | Status: AC | PRN
  Administered 2019-03-08: 30.126 via INTRAVENOUS

## 2019-03-11 ENCOUNTER — Ambulatory Visit: Payer: PPO | Admitting: Cardiovascular Disease

## 2019-03-13 DIAGNOSIS — X32XXXD Exposure to sunlight, subsequent encounter: Secondary | ICD-10-CM | POA: Diagnosis not present

## 2019-03-13 DIAGNOSIS — C4441 Basal cell carcinoma of skin of scalp and neck: Secondary | ICD-10-CM | POA: Diagnosis not present

## 2019-03-13 DIAGNOSIS — D225 Melanocytic nevi of trunk: Secondary | ICD-10-CM | POA: Diagnosis not present

## 2019-03-13 DIAGNOSIS — L820 Inflamed seborrheic keratosis: Secondary | ICD-10-CM | POA: Diagnosis not present

## 2019-03-13 DIAGNOSIS — L570 Actinic keratosis: Secondary | ICD-10-CM | POA: Diagnosis not present

## 2019-03-14 ENCOUNTER — Telehealth: Payer: Self-pay | Admitting: Cardiovascular Disease

## 2019-03-14 DIAGNOSIS — R0609 Other forms of dyspnea: Secondary | ICD-10-CM | POA: Diagnosis not present

## 2019-03-14 NOTE — Telephone Encounter (Signed)
I spoke with the patient.  He is aware of his myoview results.  He is still concerned that he is having increased SOB with walking to the mail box. He will have some chest tightness with exertion.   I have advised that we should bring him in for further discussion with Dr. Rockey Situ at this time.   The patient will come in tomorrow at 2:20 pm with Dr. Rockey Situ.     Cardiac Questionnaire:    Since your last visit or hospitalization:    1. Have you been having new or worsening chest pain? No- some continued chest tightness with exertion   2. Have you been having new or worsening shortness of breath? Yes- increased with exertion- mostly walking to the mail box 3. Have you been having new or worsening leg swelling, wt gain, or increase in abdominal girth (pants fitting more tightly)? No   4. Have you had any passing out spells? No    *A YES to any of these questions would result in the appointment being kept. *If all the answers to these questions are NO, we should indicate that given the current situation regarding the worldwide coronarvirus pandemic, at the recommendation of the CDC, we are looking to limit gatherings in our waiting area, and thus will reschedule their appointment beyond four weeks from today.   _____________   QASTM-19 Pre-Screening Questions:  . Do you currently have a fever? No (yes = cancel and refer to pcp for e-visit) . Have you recently travelled on a cruise, internationally, or to [place], [place], [place], [place], [place], or [place], [place] [employer]) ? No (yes = cancel, stay home, monitor symptoms, and contact pcp or initiate e-visit if symptoms develop) . Have you been in contact with someone that is currently pending confirmation of Covid19 testing or has been confirmed to have the [place] virus?  No (yes = cancel, stay home, away from tested individual, monitor symptoms, and contact pcp or initiate e-visit if symptoms develop) . Are you currently experiencing fatigue or cough? No (yes  = pt should be prepared to have a mask placed at the time of their visit).      Primary Cardiologist: No primary care provider on file.   Pt contacted.  History and symptoms reviewed.  Pt will f/u with [facility] provider as scheduled.  Pt. advised that we are restricting visitors at this time and request that only patients present for check-in prior to their appointment.  All other visitors should remain in their car.  If necessary, only one visitor may come with the patient, into the building. For everyone's safety, all patients and visitor entering our practice area should expect to be screened again prior to entering our waiting area.  Alvis Lemmings, RN  03/14/2019 1:16 PM

## 2019-03-14 NOTE — Telephone Encounter (Signed)
Notes recorded by Minna Merritts, MD on 03/10/2019 at 6:56 PM EDT Stress test Regions of old scar,  Pictures unchanged from 2016 (I have pulled them up myself) No indication for cardiac cath unless having more chest pain concerning for unstable angina We can talk about it in clinic if he would like

## 2019-03-15 ENCOUNTER — Ambulatory Visit (INDEPENDENT_AMBULATORY_CARE_PROVIDER_SITE_OTHER): Payer: PPO | Admitting: Cardiovascular Disease

## 2019-03-15 ENCOUNTER — Other Ambulatory Visit: Payer: Self-pay

## 2019-03-15 ENCOUNTER — Encounter: Payer: Self-pay | Admitting: Cardiovascular Disease

## 2019-03-15 VITALS — BP 142/62 | HR 62 | Ht 66.0 in | Wt 217.0 lb

## 2019-03-15 DIAGNOSIS — R0602 Shortness of breath: Secondary | ICD-10-CM

## 2019-03-15 MED ORDER — POTASSIUM CHLORIDE CRYS ER 10 MEQ PO TBCR: 10.0000 meq | EXTENDED_RELEASE_TABLET | ORAL | 3 refills | Status: DC

## 2019-03-15 MED ORDER — FUROSEMIDE 40 MG PO TABS: 40.0000 mg | ORAL_TABLET | Freq: Every day | ORAL | 6 refills | Status: DC | PRN

## 2019-03-15 NOTE — Progress Notes (Signed)
Cardiology Office Note  Date:  03/15/2019   ID:  [name] [name], DOB [birth year], MRN [id number]  PCP:  [name], MD   Chief Complaint  Patient presents with  . other    f/u myoview/ongoing SOB with exertion.Medications reviewed verbally with the patient.     HPI:  [name] [name] is a pleasant 85 y.o. gentleman with a history of  Smoking, stopped in 1969 coronary artery disease,  prior stenting to his mid LAD,  in 2006, anterior ST elevation MI December 2008 secondary to ISR hypertension,  hyperlipidemia,  chronic kidney disease stage III admitted to the hospital October 21, 2011 with chest pain cardiac cardiac catheterization  with PCI of the OM 2. He presents today for follow-up of his coronary artery disease  INTERVAL HISTORY: The patient reports today for follow up.  He has shortness of breath with exertion.  He is concerned that he may have a blockage.   Presented to the ED on 03/06/2019 after seeing Dr. Ramonita Lab and his troponin lab showed 0.03. Repeat again 0.03 The ED did not find anything of note  And he was discharged home  For further work-up, he had a stress test on 03/08/2019 This showed predominantly fixed defects in regions of old MI  Results discussed with him in detail today  Ejection fraction 34%  We were unaware but he had echocardiogram done outside facility through primary care, [facility] Results reviewed showing ejection fraction 35% presumably but not specifically detailed.  Moderate AI   Left bundle branch block has previously been seen Etiology discussed with him in detail  He has not been taking Lasix at home He has some leg swelling and states they are tender.   He had lost some weight prior to his knee surgery, but has gone up since then. Some of the weight may be attributed to fluid.   He is trying to stay at home unless he goes to appointments or grocery shopping.   He is taking losartan 100 mg, not 50 mg.  Blood pressure  142/62 Total Chol 124/ LDL 61 HBA1C 6.0 CR 1.51 Glucose 116  EKG personally reviewed by myself on todays visit Shows normal sinus rhythm with sinus arrhythmia. 62 bpm. LAD. Left bundle branch block.    OTHER PAST MEDICAL HISTORY REVIEWED BY ME FOR TODAY'S VISIT: Left total knee replacement surgery in July 2019.   Hospital admission 08/04/2015 for GI discomfort, chest pain Negative cardiac enzymes,  Acute on chronic bronchitis, treated with ABX  Cough with ACE inhibitors  Echocardiogram 08/04/2015 showing ejection fraction 45-50% with apical hypokinesis  Stress test at the end of August 2016 showing fixed defect, no ischemia, old anterior MI   chronic kidney disease, stage III, asymptomatic bradycardia, obesity, right hip replacement 2008, thrombocytopenia, anterior ST elevation MI December 2008 secondary to stent thrombosis in the setting of stopping Plavix treated with bare-metal stenting. Prior DES stent to the LAD in 2006  Prior stress test 01/10/2013 that showed Old scar and the mid to distal anterior wall, apical region.  Previous creatinine 1.9.  Prior cardiac catheterization October 2012 showed left main shows no disease, LAD has 30% proximal disease, 2 overlapping stents in the mid region, 95% ostial diagonal disease jailed by the stents, 95% proximal OM 2 disease, mild plaque in the RCA, hypokinesis of the anterolateral wall, apical region and inferoapical wall with ejection fraction 30-35%.  Lab work from June 2014 shows total cholesterol 94, LDL 42, HDL 28 Lab work  December 2014 shows total cholesterol 120  Echocardiogram October 20, 2011 shows ejection fraction 45%, apical hypokinesis, mild LVH, mildly dilated left atrium   PMH:   has a past medical history of Arthritis, CAD (coronary artery disease) (10/21/2011), Dyspnea, Hyperlipidemia, Hypertension, Myocardial infarction [facility]) (2008 & 2013), Renal insufficiency, mild, Sinus bradycardia, and Tobacco  user.  PSH:    Past Surgical History:  Procedure Laterality Date  . CARDIAC CATHETERIZATION  10/21/2011   s/p stent placement  . CARDIAC CATHETERIZATION  2008   stent placement  . CARDIAC CATHETERIZATION  2006   stent placement  . CARPAL TUNNEL RELEASE Right 07/22/2016   Procedure: CARPAL TUNNEL RELEASE;  Surgeon: Leanor Kail, MD;  Location: [facility] ORS;  Service: Orthopedics;  Laterality: Right;  . JOINT REPLACEMENT    . TOTAL HIP ARTHROPLASTY     Right  . TOTAL KNEE ARTHROPLASTY Left 06/29/2018   Procedure: LEFT TOTAL KNEE ARTHROPLASTY;  Surgeon: Frederik Pear, MD;  Location: [place];  Service: Orthopedics;  Laterality: Left;  . TRANSURETHRAL RESECTION OF PROSTATE      Current Outpatient Medications  Medication Sig Dispense Refill  . amLODipine (NORVASC) 5 MG tablet Take 5 mg by mouth daily.    Marland Kitchen aspirin EC 81 MG tablet Take 1 tablet (81 mg total) by mouth 2 (two) times daily. 60 tablet 0  . azelastine (ASTELIN) 0.1 % nasal spray Place 1 spray into the nose 2 (two) times daily.    . carvedilol (COREG) 3.125 MG tablet Take 3.125 mg by mouth 2 (two) times daily with a meal.    . clopidogrel (PLAVIX) 75 MG tablet Take 75 mg by mouth daily.      . furosemide (LASIX) 40 MG tablet Take 1 tablet (40 mg total) by mouth daily as needed (for fluid retention.). 30 tablet 6  . losartan (COZAAR) 100 MG tablet Take 100 mg by mouth daily.    Marland Kitchen losartan (COZAAR) 50 MG tablet Take 50 mg by mouth daily at 2 PM. In the afternoon    . lovastatin (MEVACOR) 40 MG tablet Take 40 mg by mouth at bedtime.    . nitroGLYCERIN (NITROSTAT) 0.4 MG SL tablet Place 0.4 mg under the tongue every 5 (five) minutes as needed for chest pain.    Marland Kitchen oxyCODONE-acetaminophen (PERCOCET/ROXICET) 5-325 MG tablet Take 1 tablet by mouth every 4 (four) hours as needed for severe pain. 30 tablet 0  . tiZANidine (ZANAFLEX) 2 MG tablet Take 1 tablet (2 mg total) by mouth every 6 (six) hours as needed. 60 tablet 0   No current  facility-administered medications for this visit.      Allergies:   Amoxicillin   Social History:  The patient  reports that he quit smoking about 50 years ago. His smoking use included cigarettes. He has a 20.00 pack-year smoking history. He has never used smokeless tobacco. He reports that he does not drink alcohol or use drugs.   Family History:   Family history is unknown by patient.   Review of Systems: Review of Systems  Constitutional: Negative.   Eyes: Negative.   Respiratory: Positive for shortness of breath (exertion).   Cardiovascular: Positive for leg swelling.  Gastrointestinal: Negative.   Genitourinary: Negative.   Musculoskeletal: Negative.   Neurological: Negative.   Psychiatric/Behavioral: Negative.   All other systems reviewed and are negative.   PHYSICAL EXAM: VS:  BP (!) 142/62 (BP Location: Left Arm, Patient Position: Sitting, Cuff Size: Normal)   Pulse 62   Ht 5'  6" (1.676 m)   Wt 217 lb (98.4 kg)   BMI 35.02 kg/m  , BMI Body mass index is 35.02 kg/m.  Constitutional:  oriented to person, place, and time. No distress.  HENT:  Head: Grossly normal Eyes:  no discharge. No scleral icterus.  Neck: No JVD, no carotid bruits  Cardiovascular: Regular rate and rhythm, no murmurs appreciated Pulmonary/Chest: Clear to auscultation bilaterally, no wheezes or rails Abdominal: Soft.  no distension.  no tenderness.  Musculoskeletal: Normal range of motion Neurological:  normal muscle tone. Coordination normal. No atrophy Skin: Skin warm and dry Psychiatric: normal affect, pleasant  Recent Labs: 03/05/2019: B Natriuretic Peptide 273.0 03/06/2019: BUN 25; Creatinine, Ser 1.51; Hemoglobin 15.6; Platelets 138; Potassium 4.7; Sodium 138    Lipid Panel Lab Results  Component Value Date   CHOL 156 10/21/2011   HDL 27 (L) 10/21/2011   LDLCALC 95 10/21/2011   TRIG 172 (H) 10/21/2011    Wt Readings from Last 3 Encounters:  03/15/19 217 lb (98.4 kg)  03/06/19  220 lb (99.8 kg)  06/20/18 206 lb 8 oz (93.7 kg)      ASSESSMENT AND PLAN:  Atherosclerosis of native coronary artery with stable angina pectoris,  native(HCC)  Long discussion concerning recent stress test Regions of fixed defect consistent with prior MI Suspect his shortness of breath symptoms likely from elevated right heart pressures but were not detailed on recent echocardiogram at outside facility.  He does have mild AI on their report but images not available for review.  He does have lower extremity edema 1+ -Recommend he start Lasix 40 mg with potassium 10 mEq every other day for 1 to 2 weeks.  Then will take Lasix with potassium 3 times a week If symptoms get worse may need to perform cardiac catheterization but we did discuss he is at higher risk given underlying renal dysfunction  HYPERTENSION, BENIGN  No changes to his blood pressure medications  Mixed hyperlipidemia  Lab work reviewed from primary care, cholesterol at goal No medication changes made Results discussed with him in detail  Chronic renal failure, stage 3 (moderate)  Stable renal function  avoid NSAIDs Will try to avoid cardiac catheterization for now  Shortness of breath exacerbated in the setting of acute on chronic systolic and diastolic CHF Plan: Recommend taking Lasix 40 mg with potassium 10 meq every other day for 1 to 2 weeks. Then as needed for SOB (probably 3x a week)   Total encounter time more than 25 minutes  Greater than 50% was spent in counseling and coordination of care with the patient  Disposition:   F/U  6 months   Orders Placed This Encounter  Procedures  . EKG 12-Lead   I, Jesus Reyes am acting as a scribe for Ida Rogue, M.D., Ph.D.  I, Ida Rogue, M.D. Ph.D., have reviewed the above documentation for accuracy and completeness, and I agree with the above.    Signed, Esmond Plants, M.D., Ph.D. 03/15/2019  [place],  [place]

## 2019-03-15 NOTE — Patient Instructions (Addendum)
Medication Instructions:  Please take lasix 40 mg with potassium 10 meq every other day for 1 to 2 weeks  Then as needed for shortness of breath (probably 3x a week)  If you need a refill on your cardiac medications before your next appointment, please call your pharmacy.    Lab work: No new labs needed   If you have labs (blood work) drawn today and your tests are completely normal, you will receive your results only by: Marland Kitchen MyChart Message (if you have MyChart) OR . A paper copy in the mail If you have any lab test that is abnormal or we need to change your treatment, we will call you to review the results.   Testing/Procedures: No new testing needed   Follow-Up: At [facility], you and your health needs are our priority.  As part of our continuing mission to provide you with exceptional heart care, we have created designated Provider Care Teams.  These Care Teams include your primary Cardiologist (physician) and Advanced Practice Providers (APPs -  Physician Assistants and Nurse Practitioners) who all work together to provide you with the care you need, when you need it.  . You will need a follow up appointment in 6 months .   Please call our office 2 months in advance to schedule this appointment.    . Providers on your designated Care Team:   . Murray Hodgkins, NP . Christell Faith, PA-C . Marrianne Mood, PA-C  Any Other Special Instructions Will Be Listed Below (If Applicable).  For educational health videos Log in to : [email or web address] Or : [email or web address], password : [email or web address]  '

## 2019-03-20 DIAGNOSIS — I25118 Atherosclerotic heart disease of native coronary artery with other forms of angina pectoris: Secondary | ICD-10-CM | POA: Diagnosis not present

## 2019-03-20 DIAGNOSIS — E538 Deficiency of other specified B group vitamins: Secondary | ICD-10-CM | POA: Diagnosis not present

## 2019-03-20 DIAGNOSIS — I10 Essential (primary) hypertension: Secondary | ICD-10-CM | POA: Diagnosis not present

## 2019-03-20 DIAGNOSIS — E7849 Other hyperlipidemia: Secondary | ICD-10-CM | POA: Diagnosis not present

## 2019-03-20 DIAGNOSIS — I5022 Chronic systolic (congestive) heart failure: Secondary | ICD-10-CM | POA: Diagnosis not present

## 2019-03-20 DIAGNOSIS — E559 Vitamin D deficiency, unspecified: Secondary | ICD-10-CM | POA: Diagnosis not present

## 2019-03-20 DIAGNOSIS — I77811 Abdominal aortic ectasia: Secondary | ICD-10-CM | POA: Diagnosis not present

## 2019-03-20 DIAGNOSIS — R739 Hyperglycemia, unspecified: Secondary | ICD-10-CM | POA: Diagnosis not present

## 2019-03-20 DIAGNOSIS — M810 Age-related osteoporosis without current pathological fracture: Secondary | ICD-10-CM | POA: Diagnosis not present

## 2019-04-08 ENCOUNTER — Ambulatory Visit: Payer: PPO | Admitting: Physician Assistant

## 2019-04-09 ENCOUNTER — Ambulatory Visit: Payer: PPO | Admitting: Physician Assistant

## 2019-05-23 DIAGNOSIS — L570 Actinic keratosis: Secondary | ICD-10-CM | POA: Diagnosis not present

## 2019-05-23 DIAGNOSIS — L820 Inflamed seborrheic keratosis: Secondary | ICD-10-CM | POA: Diagnosis not present

## 2019-05-23 DIAGNOSIS — Z85828 Personal history of other malignant neoplasm of skin: Secondary | ICD-10-CM | POA: Diagnosis not present

## 2019-05-23 DIAGNOSIS — Z08 Encounter for follow-up examination after completed treatment for malignant neoplasm: Secondary | ICD-10-CM | POA: Diagnosis not present

## 2019-05-23 DIAGNOSIS — X32XXXD Exposure to sunlight, subsequent encounter: Secondary | ICD-10-CM | POA: Diagnosis not present

## 2019-07-01 DIAGNOSIS — R739 Hyperglycemia, unspecified: Secondary | ICD-10-CM | POA: Diagnosis not present

## 2019-07-01 DIAGNOSIS — E7849 Other hyperlipidemia: Secondary | ICD-10-CM | POA: Diagnosis not present

## 2019-07-01 DIAGNOSIS — E538 Deficiency of other specified B group vitamins: Secondary | ICD-10-CM | POA: Diagnosis not present

## 2019-07-01 DIAGNOSIS — I10 Essential (primary) hypertension: Secondary | ICD-10-CM | POA: Diagnosis not present

## 2019-07-08 DIAGNOSIS — D692 Other nonthrombocytopenic purpura: Secondary | ICD-10-CM | POA: Diagnosis not present

## 2019-07-08 DIAGNOSIS — N183 Chronic kidney disease, stage 3 (moderate): Secondary | ICD-10-CM | POA: Diagnosis not present

## 2019-07-08 DIAGNOSIS — M810 Age-related osteoporosis without current pathological fracture: Secondary | ICD-10-CM | POA: Diagnosis not present

## 2019-07-08 DIAGNOSIS — Z0000 Encounter for general adult medical examination without abnormal findings: Secondary | ICD-10-CM | POA: Diagnosis not present

## 2019-07-08 DIAGNOSIS — I5022 Chronic systolic (congestive) heart failure: Secondary | ICD-10-CM | POA: Diagnosis not present

## 2019-07-08 DIAGNOSIS — I77811 Abdominal aortic ectasia: Secondary | ICD-10-CM | POA: Diagnosis not present

## 2019-07-08 DIAGNOSIS — E7849 Other hyperlipidemia: Secondary | ICD-10-CM | POA: Diagnosis not present

## 2019-07-08 DIAGNOSIS — E559 Vitamin D deficiency, unspecified: Secondary | ICD-10-CM | POA: Diagnosis not present

## 2019-07-08 DIAGNOSIS — I10 Essential (primary) hypertension: Secondary | ICD-10-CM | POA: Diagnosis not present

## 2019-07-08 DIAGNOSIS — E538 Deficiency of other specified B group vitamins: Secondary | ICD-10-CM | POA: Diagnosis not present

## 2019-07-08 DIAGNOSIS — D696 Thrombocytopenia, unspecified: Secondary | ICD-10-CM | POA: Diagnosis not present

## 2019-07-08 DIAGNOSIS — I25118 Atherosclerotic heart disease of native coronary artery with other forms of angina pectoris: Secondary | ICD-10-CM | POA: Diagnosis not present

## 2019-07-08 DIAGNOSIS — R739 Hyperglycemia, unspecified: Secondary | ICD-10-CM | POA: Diagnosis not present

## 2019-11-28 DIAGNOSIS — C4441 Basal cell carcinoma of skin of scalp and neck: Secondary | ICD-10-CM | POA: Diagnosis not present

## 2019-11-28 DIAGNOSIS — D225 Melanocytic nevi of trunk: Secondary | ICD-10-CM | POA: Diagnosis not present

## 2019-11-28 DIAGNOSIS — C44319 Basal cell carcinoma of skin of other parts of face: Secondary | ICD-10-CM | POA: Diagnosis not present

## 2019-11-28 DIAGNOSIS — L570 Actinic keratosis: Secondary | ICD-10-CM | POA: Diagnosis not present

## 2019-11-28 DIAGNOSIS — X32XXXD Exposure to sunlight, subsequent encounter: Secondary | ICD-10-CM | POA: Diagnosis not present

## 2019-11-28 DIAGNOSIS — L820 Inflamed seborrheic keratosis: Secondary | ICD-10-CM | POA: Diagnosis not present

## 2020-01-01 DIAGNOSIS — D696 Thrombocytopenia, unspecified: Secondary | ICD-10-CM | POA: Diagnosis not present

## 2020-01-01 DIAGNOSIS — E7849 Other hyperlipidemia: Secondary | ICD-10-CM | POA: Diagnosis not present

## 2020-01-01 DIAGNOSIS — I10 Essential (primary) hypertension: Secondary | ICD-10-CM | POA: Diagnosis not present

## 2020-01-01 DIAGNOSIS — R739 Hyperglycemia, unspecified: Secondary | ICD-10-CM | POA: Diagnosis not present

## 2020-01-01 DIAGNOSIS — N183 Chronic kidney disease, stage 3 unspecified: Secondary | ICD-10-CM | POA: Diagnosis not present

## 2020-01-02 DIAGNOSIS — L570 Actinic keratosis: Secondary | ICD-10-CM | POA: Diagnosis not present

## 2020-01-02 DIAGNOSIS — C44319 Basal cell carcinoma of skin of other parts of face: Secondary | ICD-10-CM | POA: Diagnosis not present

## 2020-01-02 DIAGNOSIS — X32XXXD Exposure to sunlight, subsequent encounter: Secondary | ICD-10-CM | POA: Diagnosis not present

## 2020-01-02 DIAGNOSIS — L820 Inflamed seborrheic keratosis: Secondary | ICD-10-CM | POA: Diagnosis not present

## 2020-01-08 DIAGNOSIS — D696 Thrombocytopenia, unspecified: Secondary | ICD-10-CM | POA: Diagnosis not present

## 2020-01-08 DIAGNOSIS — I5022 Chronic systolic (congestive) heart failure: Secondary | ICD-10-CM | POA: Diagnosis not present

## 2020-01-08 DIAGNOSIS — E7849 Other hyperlipidemia: Secondary | ICD-10-CM | POA: Diagnosis not present

## 2020-01-08 DIAGNOSIS — D692 Other nonthrombocytopenic purpura: Secondary | ICD-10-CM | POA: Diagnosis not present

## 2020-01-08 DIAGNOSIS — I25118 Atherosclerotic heart disease of native coronary artery with other forms of angina pectoris: Secondary | ICD-10-CM | POA: Diagnosis not present

## 2020-01-08 DIAGNOSIS — E538 Deficiency of other specified B group vitamins: Secondary | ICD-10-CM | POA: Diagnosis not present

## 2020-01-08 DIAGNOSIS — R739 Hyperglycemia, unspecified: Secondary | ICD-10-CM | POA: Diagnosis not present

## 2020-01-08 DIAGNOSIS — I77811 Abdominal aortic ectasia: Secondary | ICD-10-CM | POA: Diagnosis not present

## 2020-01-08 DIAGNOSIS — N183 Chronic kidney disease, stage 3 unspecified: Secondary | ICD-10-CM | POA: Diagnosis not present

## 2020-01-08 DIAGNOSIS — M810 Age-related osteoporosis without current pathological fracture: Secondary | ICD-10-CM | POA: Diagnosis not present

## 2020-01-08 DIAGNOSIS — I10 Essential (primary) hypertension: Secondary | ICD-10-CM | POA: Diagnosis not present

## 2020-01-08 DIAGNOSIS — E559 Vitamin D deficiency, unspecified: Secondary | ICD-10-CM | POA: Diagnosis not present

## 2020-01-12 ENCOUNTER — Ambulatory Visit: Payer: PPO | Attending: Internal Medicine

## 2020-01-12 DIAGNOSIS — Z23 Encounter for immunization: Secondary | ICD-10-CM

## 2020-01-12 NOTE — Progress Notes (Signed)
   Covid-19 Vaccination Clinic  Name:  [name] [name]    MRN: [id number] DOB: 10-02-1931  01/12/2020  [name] was observed post Covid-19 immunization for 30 minutes based on pre-vaccination screening without incidence. He was provided with Vaccine Information Sheet and instruction to access the V-Safe system.   [name] was instructed to call 911 with any severe reactions post vaccine: Marland Kitchen Difficulty breathing  . Swelling of your face and throat  . A fast heartbeat  . A bad rash all over your body  . Dizziness and weakness   Patient was discharged at 1:26

## 2020-01-13 NOTE — Progress Notes (Signed)
Cardiology Office Note  Date:  01/14/2020   ID:  [name] [name], DOB Dec 23, 1931, MRN [id number]  PCP:  [name], MD   Chief Complaint  Patient presents with  . other    Follow up A-fib. Meds reviewed by the pt. verbally. Pt. c/o shortness of breath, chest tightness and LE edema.    HPI:  [name] [name] is a pleasant 87 y.o. gentleman with a history of  Smoking, stopped in 1969 coronary artery disease,  prior stenting to his mid LAD,  in 2006, anterior ST elevation MI December 2008 secondary to ISR hypertension,  hyperlipidemia,  chronic kidney disease stage III admitted to the hospital October 21, 2011 with chest pain cardiac cardiac catheterization  with PCI of the OM 2. He presents today for follow-up of his coronary artery disease  ekg with atrial fibrillation Seen with Dr. Caryl Comes, again on today's visit  Xarelto/eliquis "too expensive" per the patient He increased aspirin up to 325 mg daily on his own with Plavix  Warfarin : "too much hassle"  Taking rare lasix, More leg edema Breathing little worse, SOB with walking  Lab work reviewed with him CR 2.1 labs reviewed 01/01/2020  EKG personally reviewed by myself on todays visit Shows atrial fibrillation right bundle branch block rate 93 bpm  Other past medical history reviewed  Presented to the ED on 03/06/2019 after seeing Dr. Ramonita Lab and his troponin lab showed 0.03. Repeat again 0.03 The ED did not find anything of note  And he was discharged home  For further work-up, he had a stress test on 03/08/2019 This showed predominantly fixed defects in regions of old MI  Results discussed with him in detail today  Ejection fraction 34%   echocardiogram  [facility] ejection fraction 35% presumably but not specifically detailed.  Moderate AI   OTHER PAST MEDICAL HISTORY REVIEWED BY ME FOR TODAY'S VISIT: Left total knee replacement surgery in July 2019.   Hospital admission 08/04/2015 for GI discomfort, chest  pain Negative cardiac enzymes,  Acute on chronic bronchitis, treated with ABX  Cough with ACE inhibitors  Echocardiogram 08/04/2015 showing ejection fraction 45-50% with apical hypokinesis  Stress test at the end of August 2016 showing fixed defect, no ischemia, old anterior MI   chronic kidney disease, stage III, asymptomatic bradycardia, obesity, right hip replacement 2008, thrombocytopenia, anterior ST elevation MI December 2008 secondary to stent thrombosis in the setting of stopping Plavix treated with bare-metal stenting. Prior DES stent to the LAD in 2006  Prior stress test 01/10/2013 that showed Old scar and the mid to distal anterior wall, apical region.  Previous creatinine 1.9.  Prior cardiac catheterization October 2012 showed left main shows no disease, LAD has 30% proximal disease, 2 overlapping stents in the mid region, 95% ostial diagonal disease jailed by the stents, 95% proximal OM 2 disease, mild plaque in the RCA, hypokinesis of the anterolateral wall, apical region and inferoapical wall with ejection fraction 30-35%.  Lab work from June 2014 shows total cholesterol 94, LDL 42, HDL 28 Lab work December 2014 shows total cholesterol 120  Echocardiogram October 20, 2011 shows ejection fraction 45%, apical hypokinesis, mild LVH, mildly dilated left atrium   PMH:   has a past medical history of Arthritis, CAD (coronary artery disease) (10/21/2011), Dyspnea, Hyperlipidemia, Hypertension, Myocardial infarction [facility]) (2008 & 2013), Renal insufficiency, mild, Sinus bradycardia, and Tobacco user.  PSH:    Past Surgical History:  Procedure Laterality Date  . CARDIAC CATHETERIZATION  10/21/2011  s/p stent placement  . CARDIAC CATHETERIZATION  2008   stent placement  . CARDIAC CATHETERIZATION  2006   stent placement  . CARPAL TUNNEL RELEASE Right 07/22/2016   Procedure: CARPAL TUNNEL RELEASE;  Surgeon: Leanor Kail, MD;  Location: [facility] ORS;  Service: Orthopedics;   Laterality: Right;  . JOINT REPLACEMENT    . TOTAL HIP ARTHROPLASTY     Right  . TOTAL KNEE ARTHROPLASTY Left 06/29/2018   Procedure: LEFT TOTAL KNEE ARTHROPLASTY;  Surgeon: Frederik Pear, MD;  Location: [place];  Service: Orthopedics;  Laterality: Left;  . TRANSURETHRAL RESECTION OF PROSTATE      Current Outpatient Medications  Medication Sig Dispense Refill  . amLODipine (NORVASC) 5 MG tablet Take 5 mg by mouth daily.    Marland Kitchen aspirin EC 81 MG tablet Take 1 tablet (81 mg total) by mouth daily. 60 tablet 0  . azelastine (ASTELIN) 0.1 % nasal spray Place 1 spray into the nose 2 (two) times daily.    . carvedilol (COREG) 3.125 MG tablet Take 3.125 mg by mouth 2 (two) times daily with a meal.    . clopidogrel (PLAVIX) 75 MG tablet Take 75 mg by mouth daily.      . furosemide (LASIX) 40 MG tablet Take 1 tablet (40 mg total) by mouth daily as needed (for fluid retention.). 30 tablet 6  . losartan (COZAAR) 100 MG tablet Take 100 mg by mouth daily.    Marland Kitchen lovastatin (MEVACOR) 40 MG tablet Take 40 mg by mouth at bedtime.    . nitroGLYCERIN (NITROSTAT) 0.4 MG SL tablet Place 0.4 mg under the tongue every 5 (five) minutes as needed for chest pain.    Marland Kitchen oxyCODONE-acetaminophen (PERCOCET/ROXICET) 5-325 MG tablet Take 1 tablet by mouth every 4 (four) hours as needed for severe pain. 30 tablet 0  . potassium chloride (KLOR-CON M10) 10 MEQ tablet Take 1 tablet (10 mEq total) by mouth as directed. Take with fluid pill (Furosemide) 90 tablet 3  . tiZANidine (ZANAFLEX) 2 MG tablet Take 1 tablet (2 mg total) by mouth every 6 (six) hours as needed. 60 tablet 0   No current facility-administered medications for this visit.     Allergies:   Amoxicillin   Social History:  The patient  reports that he quit smoking about 51 years ago. His smoking use included cigarettes. He has a 20.00 pack-year smoking history. He has never used smokeless tobacco. He reports that he does not drink alcohol or use drugs.   Family  History:   Family history is unknown by patient.   Review of Systems: Review of Systems  Constitutional: Negative.   HENT: Negative.   Eyes: Negative.   Respiratory: Positive for shortness of breath (exertion).   Cardiovascular: Positive for leg swelling.  Gastrointestinal: Negative.   Genitourinary: Negative.   Musculoskeletal: Negative.   Neurological: Negative.   Psychiatric/Behavioral: Negative.   All other systems reviewed and are negative.   PHYSICAL EXAM: VS:  BP 124/74   Pulse 93   Ht 5\' 6"  (1.676 m)   Wt 233 lb (105.7 kg)   BMI 37.61 kg/m  , BMI Body mass index is 37.61 kg/m.  Constitutional:  oriented to person, place, and time. No distress.  HENT:  Head: Grossly normal Eyes:  no discharge. No scleral icterus.  Neck: No JVD, no carotid bruits  Cardiovascular: Irregularly irregular, no murmurs appreciated Trace lower extremity edema bilaterally Pulmonary/Chest: Clear to auscultation bilaterally, no wheezes or rails Abdominal: Soft.  no distension.  no tenderness.  Musculoskeletal: Normal range of motion Neurological:  normal muscle tone. Coordination normal. No atrophy Skin: Skin warm and dry Psychiatric: normal affect, pleasant  Recent Labs: 03/05/2019: B Natriuretic Peptide 273.0 03/06/2019: BUN 25; Creatinine, Ser 1.51; Hemoglobin 15.6; Platelets 138; Potassium 4.7; Sodium 138    Lipid Panel Lab Results  Component Value Date   CHOL 156 10/21/2011   HDL 27 (L) 10/21/2011   LDLCALC 95 10/21/2011   TRIG 172 (H) 10/21/2011    Wt Readings from Last 3 Encounters:  01/14/20 233 lb (105.7 kg)  03/15/19 217 lb (98.4 kg)  03/06/19 220 lb (99.8 kg)      ASSESSMENT AND PLAN:  Atrial fib, new After long discussion concerning risk of stroke, he is willing to start warfarin We have recommended he start warfarin 5 mg daily,  INR check next week in our clinic Coreg up to 6.25 mg twice a day for rate control Extra Lasix for any leg swelling shortness of  breath abdominal bloating Weight appears 10 pounds from last year --In close follow-up will discuss various treatment options once he has been anticoagulated   Atherosclerosis of native coronary artery with stable angina pectoris,  native(HCC)  Prior stress test Regions of fixed defect consistent with prior MI Denies having any unstable angina symptoms Medication changes as above  Hypertension Increase carvedilol as above  Mixed hyperlipidemia  Goal LDL less than 70  Chronic renal failure, stage 3 (moderate)  Avoid NSAIDs, slow rising creatinine Will avoid excess diuresis  Shortness of breath Chronic issue from CHF, exacerbated by atrial fibrillation deconditioning   Total encounter time more than 25 minutes  Greater than 50% was spent in counseling and coordination of care with the patient  Disposition:   F/U  2-3 months   Orders Placed This Encounter  Procedures  . EKG 12-Lead   Signed, Esmond Plants, M.D., Ph.D. 01/14/2020  [place], [place]

## 2020-01-14 ENCOUNTER — Other Ambulatory Visit: Payer: Self-pay

## 2020-01-14 ENCOUNTER — Encounter: Payer: Self-pay | Admitting: Cardiovascular Disease

## 2020-01-14 ENCOUNTER — Ambulatory Visit: Payer: PPO | Admitting: Cardiovascular Disease

## 2020-01-14 VITALS — BP 124/74 | HR 93 | Ht 66.0 in | Wt 233.0 lb

## 2020-01-14 DIAGNOSIS — R079 Chest pain, unspecified: Secondary | ICD-10-CM | POA: Diagnosis not present

## 2020-01-14 DIAGNOSIS — I10 Essential (primary) hypertension: Secondary | ICD-10-CM

## 2020-01-14 DIAGNOSIS — I25119 Atherosclerotic heart disease of native coronary artery with unspecified angina pectoris: Secondary | ICD-10-CM

## 2020-01-14 DIAGNOSIS — N183 Chronic kidney disease, stage 3 unspecified: Secondary | ICD-10-CM

## 2020-01-14 DIAGNOSIS — E782 Mixed hyperlipidemia: Secondary | ICD-10-CM | POA: Diagnosis not present

## 2020-01-14 DIAGNOSIS — R0602 Shortness of breath: Secondary | ICD-10-CM

## 2020-01-14 MED ORDER — CARVEDILOL 3.125 MG PO TABS: 6.2500 mg | ORAL_TABLET | Freq: Two times a day (BID) | ORAL | 5 refills | Status: DC

## 2020-01-14 MED ORDER — WARFARIN SODIUM 5 MG PO TABS: 5.0000 mg | ORAL_TABLET | Freq: Every day | ORAL | 3 refills | Status: DC

## 2020-01-14 MED ORDER — WARFARIN SODIUM 5 MG PO TABS: 5.0000 mg | ORAL_TABLET | Freq: Every day | ORAL | 0 refills | Status: DC

## 2020-01-14 NOTE — Patient Instructions (Addendum)
Medication Instructions:   Please start warfarin 5 mg daily,  INR check next week  No more then asa 81 daily  Stop plavix  Coreg up to 6.25 mg twice a day  Stop all potassium  If you need a refill on your cardiac medications before your next appointment, please call your pharmacy.    Lab work: No new labs needed   If you have labs (blood work) drawn today and your tests are completely normal, you will receive your results only by: Marland Kitchen MyChart Message (if you have MyChart) OR . A paper copy in the mail If you have any lab test that is abnormal or we need to change your treatment, we will call you to review the results.   Testing/Procedures: No new testing needed   Follow-Up: At [facility], you and your health needs are our priority.  As part of our continuing mission to provide you with exceptional heart care, we have created designated Provider Care Teams.  These Care Teams include your primary Cardiologist (physician) and Advanced Practice Providers (APPs -  Physician Assistants and Nurse Practitioners) who all work together to provide you with the care you need, when you need it.  . You will need a follow up appointment in 2-3 months   . Providers on your designated Care Team:   . Murray Hodgkins, NP . Christell Faith, PA-C . Marrianne Mood, PA-C  Any Other Special Instructions Will Be Listed Below (If Applicable).  For educational health videos Log in to : [email or web address] Or : [email or web address], password : [email or web address]

## 2020-01-19 ENCOUNTER — Inpatient Hospital Stay (HOSPITAL_COMMUNITY)
Admission: EM | Admit: 2020-01-19 | Discharge: 2020-01-21 | DRG: 281 | Disposition: A | Discharge status: Home / Self Care | Payer: PPO | Attending: Internal Medicine | Admitting: Internal Medicine

## 2020-01-19 ENCOUNTER — Emergency Department (HOSPITAL_COMMUNITY): Payer: PPO

## 2020-01-19 ENCOUNTER — Encounter (HOSPITAL_COMMUNITY): Payer: Self-pay

## 2020-01-19 ENCOUNTER — Other Ambulatory Visit: Payer: Self-pay

## 2020-01-19 DIAGNOSIS — E782 Mixed hyperlipidemia: Secondary | ICD-10-CM

## 2020-01-19 DIAGNOSIS — N289 Disorder of kidney and ureter, unspecified: Secondary | ICD-10-CM

## 2020-01-19 DIAGNOSIS — I25119 Atherosclerotic heart disease of native coronary artery with unspecified angina pectoris: Secondary | ICD-10-CM | POA: Diagnosis present

## 2020-01-19 DIAGNOSIS — Z20822 Contact with and (suspected) exposure to covid-19: Secondary | ICD-10-CM | POA: Diagnosis present

## 2020-01-19 DIAGNOSIS — I214 Non-ST elevation (NSTEMI) myocardial infarction: Principal | ICD-10-CM

## 2020-01-19 DIAGNOSIS — Z96652 Presence of left artificial knee joint: Secondary | ICD-10-CM | POA: Diagnosis present

## 2020-01-19 DIAGNOSIS — I4891 Unspecified atrial fibrillation: Secondary | ICD-10-CM | POA: Diagnosis not present

## 2020-01-19 DIAGNOSIS — Z880 Allergy status to penicillin: Secondary | ICD-10-CM | POA: Diagnosis not present

## 2020-01-19 DIAGNOSIS — E785 Hyperlipidemia, unspecified: Secondary | ICD-10-CM | POA: Diagnosis not present

## 2020-01-19 DIAGNOSIS — E875 Hyperkalemia: Secondary | ICD-10-CM | POA: Diagnosis present

## 2020-01-19 DIAGNOSIS — N184 Chronic kidney disease, stage 4 (severe): Secondary | ICD-10-CM | POA: Diagnosis present

## 2020-01-19 DIAGNOSIS — R778 Other specified abnormalities of plasma proteins: Secondary | ICD-10-CM | POA: Diagnosis not present

## 2020-01-19 DIAGNOSIS — R079 Chest pain, unspecified: Secondary | ICD-10-CM | POA: Diagnosis not present

## 2020-01-19 DIAGNOSIS — Z87891 Personal history of nicotine dependence: Secondary | ICD-10-CM | POA: Diagnosis not present

## 2020-01-19 DIAGNOSIS — I25118 Atherosclerotic heart disease of native coronary artery with other forms of angina pectoris: Secondary | ICD-10-CM | POA: Diagnosis not present

## 2020-01-19 DIAGNOSIS — N179 Acute kidney failure, unspecified: Secondary | ICD-10-CM | POA: Diagnosis not present

## 2020-01-19 DIAGNOSIS — Z79899 Other long term (current) drug therapy: Secondary | ICD-10-CM | POA: Diagnosis not present

## 2020-01-19 DIAGNOSIS — I447 Left bundle-branch block, unspecified: Secondary | ICD-10-CM | POA: Diagnosis present

## 2020-01-19 DIAGNOSIS — Z7901 Long term (current) use of anticoagulants: Secondary | ICD-10-CM | POA: Diagnosis not present

## 2020-01-19 DIAGNOSIS — Z955 Presence of coronary angioplasty implant and graft: Secondary | ICD-10-CM | POA: Diagnosis not present

## 2020-01-19 DIAGNOSIS — N1831 Chronic kidney disease, stage 3a: Secondary | ICD-10-CM

## 2020-01-19 DIAGNOSIS — I2510 Atherosclerotic heart disease of native coronary artery without angina pectoris: Secondary | ICD-10-CM | POA: Diagnosis present

## 2020-01-19 DIAGNOSIS — I4819 Other persistent atrial fibrillation: Secondary | ICD-10-CM | POA: Diagnosis present

## 2020-01-19 DIAGNOSIS — I129 Hypertensive chronic kidney disease with stage 1 through stage 4 chronic kidney disease, or unspecified chronic kidney disease: Secondary | ICD-10-CM | POA: Diagnosis not present

## 2020-01-19 DIAGNOSIS — I10 Essential (primary) hypertension: Secondary | ICD-10-CM | POA: Diagnosis not present

## 2020-01-19 DIAGNOSIS — I255 Ischemic cardiomyopathy: Secondary | ICD-10-CM | POA: Diagnosis present

## 2020-01-19 DIAGNOSIS — Z96641 Presence of right artificial hip joint: Secondary | ICD-10-CM | POA: Diagnosis not present

## 2020-01-19 DIAGNOSIS — N189 Chronic kidney disease, unspecified: Secondary | ICD-10-CM

## 2020-01-19 DIAGNOSIS — Z7982 Long term (current) use of aspirin: Secondary | ICD-10-CM | POA: Diagnosis not present

## 2020-01-19 DIAGNOSIS — R0789 Other chest pain: Secondary | ICD-10-CM | POA: Diagnosis not present

## 2020-01-19 DIAGNOSIS — R7989 Other specified abnormal findings of blood chemistry: Secondary | ICD-10-CM

## 2020-01-19 DIAGNOSIS — N183 Chronic kidney disease, stage 3 unspecified: Secondary | ICD-10-CM | POA: Diagnosis present

## 2020-01-19 LAB — COMPREHENSIVE METABOLIC PANEL
ALT: 28 U/L (ref 0–44)
AST: 28 U/L (ref 15–41)
Albumin: 3.5 g/dL (ref 3.5–5.0)
Alkaline Phosphatase: 41 U/L (ref 38–126)
Anion gap: 5 (ref 5–15)
BUN: 35 mg/dL — ABNORMAL HIGH (ref 8–23)
CO2: 22 mmol/L (ref 22–32)
Calcium: 8.5 mg/dL — ABNORMAL LOW (ref 8.9–10.3)
Chloride: 111 mmol/L (ref 98–111)
Creatinine, Ser: 2.2 mg/dL — ABNORMAL HIGH (ref 0.61–1.24)
GFR calc Af Amer: 30 mL/min — ABNORMAL LOW (ref >60)
GFR calc non Af Amer: 26 mL/min — ABNORMAL LOW (ref >60)
Glucose, Bld: 109 mg/dL — ABNORMAL HIGH (ref 70–99)
Potassium: 6.1 mmol/L — ABNORMAL HIGH (ref 3.5–5.1)
Sodium: 138 mmol/L (ref 135–145)
Total Bilirubin: 0.8 mg/dL (ref 0.3–1.2)
Total Protein: 6.1 g/dL — ABNORMAL LOW (ref 6.5–8.1)

## 2020-01-19 LAB — RESPIRATORY PANEL BY RT PCR (FLU A&B, COVID)
Influenza A by PCR: NEGATIVE
Influenza B by PCR: NEGATIVE
SARS Coronavirus 2 by RT PCR: NEGATIVE

## 2020-01-19 LAB — CBC WITH DIFFERENTIAL/PLATELET
Abs Immature Granulocytes: 0.03 10*3/uL (ref 0.00–0.07)
Basophils Absolute: 0 10*3/uL (ref 0.0–0.1)
Basophils Relative: 1 %
Eosinophils Absolute: 0.2 10*3/uL (ref 0.0–0.5)
Eosinophils Relative: 3 %
HCT: 44.6 % (ref 39.0–52.0)
Hemoglobin: 14.4 g/dL (ref 13.0–17.0)
Immature Granulocytes: 1 %
Lymphocytes Relative: 25 %
Lymphs Abs: 1.6 10*3/uL (ref 0.7–4.0)
MCH: 34.2 pg — ABNORMAL HIGH (ref 26.0–34.0)
MCHC: 32.3 g/dL (ref 30.0–36.0)
MCV: 105.9 fL — ABNORMAL HIGH (ref 80.0–100.0)
Monocytes Absolute: 0.5 10*3/uL (ref 0.1–1.0)
Monocytes Relative: 8 %
Neutro Abs: 4.1 10*3/uL (ref 1.7–7.7)
Neutrophils Relative %: 62 %
Platelets: 136 10*3/uL — ABNORMAL LOW (ref 150–400)
RBC: 4.21 MIL/uL — ABNORMAL LOW (ref 4.22–5.81)
RDW: 13.1 % (ref 11.5–15.5)
WBC: 6.5 10*3/uL (ref 4.0–10.5)
nRBC: 0 % (ref 0.0–0.2)

## 2020-01-19 LAB — BASIC METABOLIC PANEL
Anion gap: 9 (ref 5–15)
BUN: 36 mg/dL — ABNORMAL HIGH (ref 8–23)
CO2: 21 mmol/L — ABNORMAL LOW (ref 22–32)
Calcium: 8.9 mg/dL (ref 8.9–10.3)
Chloride: 108 mmol/L (ref 98–111)
Creatinine, Ser: 2.19 mg/dL — ABNORMAL HIGH (ref 0.61–1.24)
GFR calc Af Amer: 30 mL/min — ABNORMAL LOW (ref >60)
GFR calc non Af Amer: 26 mL/min — ABNORMAL LOW (ref >60)
Glucose, Bld: 136 mg/dL — ABNORMAL HIGH (ref 70–99)
Potassium: 5.4 mmol/L — ABNORMAL HIGH (ref 3.5–5.1)
Sodium: 138 mmol/L (ref 135–145)

## 2020-01-19 LAB — TROPONIN I (HIGH SENSITIVITY)
Troponin I (High Sensitivity): 916 ng/L (ref <18)
Troponin I (High Sensitivity): 3275 ng/L (ref <18)
Troponin I (High Sensitivity): 5845 ng/L (ref <18)

## 2020-01-19 LAB — MRSA PCR SCREENING: MRSA by PCR: NEGATIVE

## 2020-01-19 LAB — PROTIME-INR
INR: 1.7 — ABNORMAL HIGH (ref 0.8–1.2)
Prothrombin Time: 19.8 seconds — ABNORMAL HIGH (ref 11.4–15.2)

## 2020-01-19 MED ORDER — NITROGLYCERIN IN D5W 200-5 MCG/ML-% IV SOLN
0.0000 ug/min | INTRAVENOUS | Status: DC
  Administered 2020-01-19: 5 ug/min via INTRAVENOUS
  Filled 2020-01-19: qty 250

## 2020-01-19 MED ORDER — SODIUM BICARBONATE 8.4 % IV SOLN
50.0000 meq | Freq: Once | INTRAVENOUS | Status: AC
  Administered 2020-01-19: 50 meq via INTRAVENOUS
  Filled 2020-01-19: qty 50

## 2020-01-19 MED ORDER — SODIUM ZIRCONIUM CYCLOSILICATE 10 G PO PACK
10.0000 g | PACK | Freq: Once | ORAL | Status: AC
  Administered 2020-01-19: 10 g via ORAL
  Filled 2020-01-19: qty 1

## 2020-01-19 MED ORDER — PRAVASTATIN SODIUM 40 MG PO TABS
40.0000 mg | ORAL_TABLET | Freq: Every day | ORAL | Status: DC
  Administered 2020-01-20: 40 mg via ORAL
  Filled 2020-01-19: qty 1

## 2020-01-19 MED ORDER — SODIUM CHLORIDE 0.9 % IV SOLN: INTRAVENOUS | Status: AC

## 2020-01-19 MED ORDER — HEPARIN BOLUS VIA INFUSION
4000.0000 [IU] | Freq: Once | INTRAVENOUS | Status: AC
  Administered 2020-01-19: 4000 [IU] via INTRAVENOUS
  Filled 2020-01-19: qty 4000

## 2020-01-19 MED ORDER — CARVEDILOL 6.25 MG PO TABS
6.2500 mg | ORAL_TABLET | Freq: Two times a day (BID) | ORAL | Status: DC
  Administered 2020-01-20 – 2020-01-21 (×3): 6.25 mg via ORAL
  Filled 2020-01-19 (×3): qty 1

## 2020-01-19 MED ORDER — METOPROLOL TARTRATE 5 MG/5ML IV SOLN
2.5000 mg | Freq: Once | INTRAVENOUS | Status: AC
  Administered 2020-01-19: 2.5 mg via INTRAVENOUS
  Filled 2020-01-19: qty 5

## 2020-01-19 MED ORDER — FUROSEMIDE 40 MG PO TABS: 40.0000 mg | ORAL_TABLET | Freq: Every day | ORAL | Status: DC | PRN

## 2020-01-19 MED ORDER — CALCIUM GLUCONATE-NACL 1-0.675 GM/50ML-% IV SOLN
1.0000 g | Freq: Once | INTRAVENOUS | Status: AC
  Administered 2020-01-19: 1000 mg via INTRAVENOUS
  Filled 2020-01-19: qty 50

## 2020-01-19 MED ORDER — CARVEDILOL 12.5 MG PO TABS
6.2500 mg | ORAL_TABLET | Freq: Once | ORAL | Status: AC
  Administered 2020-01-19: 6.25 mg via ORAL
  Filled 2020-01-19: qty 1

## 2020-01-19 MED ORDER — ASPIRIN EC 81 MG PO TBEC
81.0000 mg | DELAYED_RELEASE_TABLET | Freq: Every day | ORAL | Status: DC
  Administered 2020-01-19 – 2020-01-21 (×3): 81 mg via ORAL
  Filled 2020-01-19 (×4): qty 1

## 2020-01-19 MED ORDER — ACETAMINOPHEN 325 MG PO TABS: 650.0000 mg | ORAL_TABLET | Freq: Four times a day (QID) | ORAL | Status: DC | PRN

## 2020-01-19 MED ORDER — HEPARIN (PORCINE) 25000 UT/250ML-% IV SOLN
1000.0000 [IU]/h | INTRAVENOUS | Status: DC
  Administered 2020-01-19: 1200 [IU]/h via INTRAVENOUS
  Filled 2020-01-19: qty 250

## 2020-01-19 MED ORDER — ACETAMINOPHEN 650 MG RE SUPP: 650.0000 mg | Freq: Four times a day (QID) | RECTAL | Status: DC | PRN

## 2020-01-19 MED ORDER — AMLODIPINE BESYLATE 5 MG PO TABS
5.0000 mg | ORAL_TABLET | Freq: Every day | ORAL | Status: DC
  Administered 2020-01-19 – 2020-01-21 (×3): 5 mg via ORAL
  Filled 2020-01-19 (×3): qty 1

## 2020-01-19 NOTE — ED Notes (Signed)
Report given to Ammi on 2C. All questions answered.

## 2020-01-19 NOTE — Progress Notes (Signed)
ANTICOAGULATION CONSULT NOTE - Initial Consult  Pharmacy Consult for heparin Indication: chest pain/ACS   Patient Measurements: Heparin Dosing Weight: ~ 88 kg  Vital Signs: Temp: 98.3 F (36.8 C) (01/24 1713) Temp Source: Oral (01/24 1713) BP: 148/87 (01/24 1815) Pulse Rate: 103 (01/24 1815)  Labs: Recent Labs    01/19/20 1754 01/19/20 1755  HGB 14.4  --   HCT 44.6  --   PLT 136*  --   LABPROT  --  19.8*  INR  --  1.7*  CREATININE 2.20*  --   TROPONINIHS 916*  --     Estimated Creatinine Clearance: 27.5 mL/min (A) (by C-G formula based on SCr of 2.2 mg/dL (H)).   Medical History: Past Medical History:  Diagnosis Date  . Arthritis   . CAD (coronary artery disease) 10/21/2011   s/p stent placement  . Dyspnea   . Hyperlipidemia   . Hypertension   . Myocardial infarction [facility]) 2008 & 2013  . Renal insufficiency, mild   . Sinus bradycardia    Assymptomatic  . Tobacco user    Remote      Assessment: 86 yo male admitted with chest pain. He was recently started on warfarin for AFib without a bridge, is INR on admit is 1.7. Now concern for NSTEMI, troponin is positive.   Goal of Therapy:  Heparin level 0.3-0.7 units/ml Monitor platelets by anticoagulation protocol: Yes    Plan:  -Heparin bolus 4000 units x1 then 1200 units/hr -Daily HL, CBC -First level in the morning   Harvel Quale 01/19/2020,6:43 PM

## 2020-01-19 NOTE — ED Notes (Addendum)
Pt alert, resting on cart in NAD. VSS. Nitro and heparin drips initiated per [facility]. PIV initiated, 18G to R forearm. PIV flushes with 10 cc NS. Secured with tape and tegaderm. Positive blood return noted Troponin drawn, labeled with 2 pt identifiers, and sent to lab

## 2020-01-19 NOTE — ED Notes (Signed)
Admitting MD Dr. Maudie Mercury at bedside

## 2020-01-19 NOTE — Consult Note (Signed)
Cardiology Consultation:   Patient ID: [name] [name] MRN: [id number]; DOB: [birth year]  Admit date: 01/19/2020 Date of Consult: 01/19/2020  Primary Care Provider: [name], MD Primary Cardiologist: [name] [name] Primary Electrophysiologist:  None    Patient Profile:   [name] [name] is a 86 y.o. male with a hx of CAD w/ multiple PCI who is being seen today for the evaluation of CP/NSTEMI at the request of [name] Gravel, MD.  History of Present Illness:   [name] [name] has h/o CAD, prior stenting to his mid LAD,  in 2006, anterior ST elevation MI December 2008 secondary to ISR, s/p PCI to [place] in October 2012. He has h/o afib (has declined DOAC due to expense but currently on coumadin), CKD Stage III, and h/o ICM with EF 45-50%. He has h/o LBBB on EKG. Pt states he came in to ED for evaluation after onset of chest discomfort at rest this afternoon shortly after noon. He describes his discomfort and L-sided "pressure", with radiation to the L arm and associated with diaphoresis. The pain was persistent after taking ASA x2 as well as NTG SL x 2 doses with minimal relief. He does note that he has been having mild progressive DOE that he has noticed for the past 3 months or so. He was just seen by his cardiologist in [place] on 01-14-20 and he denied any acute symptoms or changes during that visit.   Heart Pathway Score:     Past Medical History:  Diagnosis Date  . Arthritis   . CAD (coronary artery disease) 10/21/2011   s/p stent placement  . Dyspnea   . Hyperlipidemia   . Hypertension   . Myocardial infarction [facility]) 2008 & 2013  . Renal insufficiency, mild   . Sinus bradycardia    Assymptomatic  . Tobacco user    Remote    Past Surgical History:  Procedure Laterality Date  . CARDIAC CATHETERIZATION  10/21/2011   s/p stent placement  . CARDIAC CATHETERIZATION  2008   stent placement  . CARDIAC CATHETERIZATION  2006   stent placement  . CARPAL TUNNEL RELEASE Right  07/22/2016   Procedure: CARPAL TUNNEL RELEASE;  Surgeon: Leanor Kail, MD;  Location: [facility] ORS;  Service: Orthopedics;  Laterality: Right;  . JOINT REPLACEMENT    . TOTAL HIP ARTHROPLASTY     Right  . TOTAL KNEE ARTHROPLASTY Left 06/29/2018   Procedure: LEFT TOTAL KNEE ARTHROPLASTY;  Surgeon: Frederik Pear, MD;  Location: [place];  Service: Orthopedics;  Laterality: Left;  . TRANSURETHRAL RESECTION OF PROSTATE       No current facility-administered medications on file prior to encounter.   Current Outpatient Medications on File Prior to Encounter  Medication Sig Dispense Refill  . amLODipine (NORVASC) 5 MG tablet Take 5 mg by mouth daily.    Marland Kitchen aspirin EC 81 MG tablet Take 1 tablet (81 mg total) by mouth daily. 60 tablet 0  . carvedilol (COREG) 3.125 MG tablet Take 2 tablets (6.25 mg total) by mouth 2 (two) times daily with a meal. 30 tablet 5  . furosemide (LASIX) 40 MG tablet Take 1 tablet (40 mg total) by mouth daily as needed (for fluid retention.). 30 tablet 6  . losartan (COZAAR) 100 MG tablet Take 100 mg by mouth daily.    Marland Kitchen lovastatin (MEVACOR) 40 MG tablet Take 40 mg by mouth at bedtime.    . nitroGLYCERIN (NITROSTAT) 0.4 MG SL tablet Place 0.4 mg under the tongue every 5 (five)  minutes as needed for chest pain.    Marland Kitchen warfarin (COUMADIN) 5 MG tablet Take 1 tablet (5 mg total) by mouth daily. 30 tablet 0  . azelastine (ASTELIN) 0.1 % nasal spray Place 1 spray into the nose 2 (two) times daily.    Marland Kitchen oxyCODONE-acetaminophen (PERCOCET/ROXICET) 5-325 MG tablet Take 1 tablet by mouth every 4 (four) hours as needed for severe pain. (Patient not taking: Reported on 01/19/2020) 30 tablet 0  . tiZANidine (ZANAFLEX) 2 MG tablet Take 1 tablet (2 mg total) by mouth every 6 (six) hours as needed. (Patient not taking: Reported on 01/19/2020) 60 tablet 0     Allergies:    Allergies  Allergen Reactions  . Amoxicillin Rash    Has patient had a PCN reaction causing immediate rash, facial/tongue/throat  swelling, SOB or lightheadedness with hypotension: Unknown Has patient had a PCN reaction causing severe rash involving mucus membranes or skin necrosis: No Has patient had a PCN reaction that required hospitalization: No Has patient had a PCN reaction occurring within the last 10 years: Yes If all of the above answers are "NO", then may proceed with Cephalosporin use.     Social History:   Social History   Socioeconomic History  . Marital status: Married    Spouse name: Not on file  . Number of children: Not on file  . Years of education: Not on file  . Highest education level: Not on file  Occupational History  . Occupation: Retired    Fish farm manager: [employer]  Tobacco Use  . Smoking status: Former Smoker    Packs/day: 1.00    Years: 20.00    Pack years: 20.00    Types: Cigarettes    Quit date: 12/06/1968    Years since quitting: 51.1  . Smokeless tobacco: Never Used  Substance and Sexual Activity  . Alcohol use: No  . Drug use: No  . Sexual activity: Not on file  Other Topics Concern  . Not on file  Social History Narrative   Married   Lives in [place] with his wife   Social Determinants of Health   Financial Resource Strain:   . Difficulty of Paying Living Expenses: Not on file  Food Insecurity:   . Worried About Charity fundraiser in the Last Year: Not on file  . Ran Out of Food in the Last Year: Not on file  Transportation Needs:   . Lack of Transportation (Medical): Not on file  . Lack of Transportation (Non-Medical): Not on file  Physical Activity:   . Days of Exercise per Week: Not on file  . Minutes of Exercise per Session: Not on file  Stress:   . Feeling of Stress : Not on file  Social Connections:   . Frequency of Communication with Friends and Family: Not on file  . Frequency of Social Gatherings with Friends and Family: Not on file  . Attends Religious Services: Not on file  . Active Member of Clubs or Organizations: Not on file  . Attends English as a second language teacher Meetings: Not on file  . Marital Status: Not on file  Intimate Partner Violence:   . Fear of Current or Ex-Partner: Not on file  . Emotionally Abused: Not on file  . Physically Abused: Not on file  . Sexually Abused: Not on file    Family History:    Family History  Family history unknown: Yes     ROS:  Please see the history of present illness.  All other ROS reviewed and negative.     Physical Exam/Data:   Vitals:   01/19/20 2040 01/19/20 2052 01/19/20 2055 01/19/20 2100  BP:  (!) 153/87 (!) 149/85 (!) 158/93  Pulse:  96 (!) 104 80  Resp:   (!) 26 17  Temp:      TempSrc:      SpO2: 97%  96% 97%   No intake or output data in the 24 hours ending 01/19/20 2108 Last 3 Weights 01/14/2020 03/15/2019 03/06/2019  Weight (lbs) 233 lb 217 lb 220 lb  Weight (kg) 105.688 kg 98.431 kg 99.791 kg     There is no height or weight on file to calculate BMI.  General:  Well nourished, well developed, in no acute distress HEENT: normal Lymph: no adenopathy Neck: no JVD Endocrine:  No thryomegaly Vascular: No carotid bruits; DP pulses 2+ bilaterally  Cardiac:  normal S1, S2; irreg irreg, tachy; no murmur  Lungs:  clear to auscultation bilaterally, no wheezing, rhonchi or rales  Abd: soft, nontender, no hepatomegaly  Ext: 1+ edema Musculoskeletal:  No deformities, BUE and BLE strength normal and equal Skin: warm and dry  Neuro:  CNs 2-12 intact, no focal abnormalities noted Psych:  Normal affect   EKG:  The EKG was personally reviewed and demonstrates:  afib w/ LBBB, HR 99  Relevant CV Studies: TTE 08-04-15 Left ventricle: The cavity size was normal. Systolic function was   mildly reduced. The estimated ejection fraction was in the range   of 45% to 50%. There is akinesis of the distal septal and apical   myocardium. There is hypokinesis of the mid anteroseptal and   apical anterior myocardium. There was an increased relative   contribution of atrial contraction to  ventricular filling.   Doppler parameters are consistent with abnormal left ventricular   relaxation (grade 1 diastolic dysfunction). - Aortic valve: There was mild regurgitation.  Laboratory Data:  High Sensitivity Troponin:   Recent Labs  Lab 01/19/20 1754  TROPONINIHS 916*     Chemistry Recent Labs  Lab 01/19/20 1754  NA 138  K 6.1*  CL 111  CO2 22  GLUCOSE 109*  BUN 35*  CREATININE 2.20*  CALCIUM 8.5*  GFRNONAA 26*  GFRAA 30*  ANIONGAP 5    Recent Labs  Lab 01/19/20 1754  PROT 6.1*  ALBUMIN 3.5  AST 28  ALT 28  ALKPHOS 41  BILITOT 0.8   Hematology Recent Labs  Lab 01/19/20 1754  WBC 6.5  RBC 4.21*  HGB 14.4  HCT 44.6  MCV 105.9*  MCH 34.2*  MCHC 32.3  RDW 13.1  PLT 136*   BNPNo results for input(s): BNP, PROBNP in the last 168 hours.  DDimer No results for input(s): DDIMER in the last 168 hours.   Radiology/Studies:  DG Chest Portable 1 View  Result Date: 01/19/2020 CLINICAL DATA:  LEFT side chest pain for 1 month, associated LEFT arm pain, increased with exertion and relieved with rest, began today way and sitting, history coronary artery disease post MI, hypertension EXAM: PORTABLE CHEST 1 VIEW COMPARISON:  Portable exam 1723 hours compared to 03/06/2019 FINDINGS: Minimal enlargement of cardiac silhouette. Mediastinal contours and pulmonary vascularity normal. Lungs clear. No pulmonary infiltrate, pleural effusion or pneumothorax. Bones slightly demineralized. IMPRESSION: Minimal enlargement of cardiac silhouette. No acute abnormalities. Electronically Signed   By: Lavonia Dana M.D.   On: 01/19/2020 17:59       TIMI Risk Score for Unstable Angina or Non-ST Elevation MI:  The patient's TIMI risk score is 5, which indicates a 26% risk of all cause mortality, new or recurrent myocardial infarction or need for urgent revascularization in the next 14 days.   Assessment and Plan:   1. CP/NSTEMI: h/o multiple PCI in past. I feel like his current sx  are in large part due to recently diagnosed afib (which is suboptimally controlled in regard to HR) creating a demand situation, possibly in setting of obstructive CAD as well. initial HS trop is 916 in setting of chronic CAD, HF and Cr of 2.20. Will cycle troponins to get an idea of trend. Pt will likely need repeat LHC; he appears to have mild acute or chronic renal insufficiency (last Cr 1.51, tonight 2.2). hopefully his renal function can be optimized in advance of dye load w/ LHC. Agree w/ heparin IV gtt. NTG and morphine PRN for relief of sx. Will repeat TTE to reassess LVEF.  He really needs better rate control and possible DCCV to restore NSR; would increase coreg dose to 12.5mg  bid as below, and consider changing over to toprol. He may benefit from EP eval and possible DCCV +/- anti-arrhythmic as his afib is clearly symptomatic.   2. Afib: ?paroxysmal? And relatively new-diagnosis. He was noted to be in afib last week at last visit w/ his cardiologist. Rate control is marginal. Can try increasing coreg to 12.5mg  bid, although a switch to metoprolol XL may ultimately be more effective at controlling HR. As above, I think his current sx are in large part due to loss of atrial kick and suboptimal rate control.  3. HTN: cont home regimen and adjust as needed. Plan to increase coreg dose as above for rate control of afib  4. Dyslipidemia: LDL goal<70. OK to cont lovastatin for now but have low threshold for switching to higher intensity if LDL is above goal  5. ICM: repeat TTE as above  6. Abn EKG: LBBB, old.  7. CKD: likely mild acute on chronic renal insufficiency w/ hyperkalemia. Defer mgmt to primary team    For questions or updates, please contact [facility] Please consult [email or web address] for contact info under    Signed, Rudean Curt, MD, [facility]  01/19/2020 9:08 PM

## 2020-01-19 NOTE — ED Notes (Signed)
All meds given per [facility]. Name/DOB verified with pt. Pt reporting complete relief in chest pain and arm pain after nitro drip initiation. VSS

## 2020-01-19 NOTE — ED Triage Notes (Signed)
Pt BIB [facility] for eval of L sided CP x 1 month w/ associated L sided arm pain. Reports associated w/ exertion, relieved w/ rest. States spontaneously resolves. Pt reports he was sitting down today and CP started, took 2 SL NTG at home and 2 adult ASA, pt also rec'd 1 SL NTG w/ EMS. States +diaphoresis and nausea w/ chest pain. Reports pain initially 10/10, arrives w/ pain 1/10.

## 2020-01-19 NOTE — ED Provider Notes (Signed)
[facility] EMERGENCY DEPARTMENT Provider Note   CSN: [id number] Arrival date & time: 01/19/20  1702     History Chief Complaint  Patient presents with  . Chest Pain    [name] [name] is a 86 y.o. male.  HPI Patient presents with chest pain.  Pressure is mid chest.  Has been coming on with exertion.  States it does feel somewhat like his previous heart attacks.  Had had more shortness of breath with the episodes but now appears to have more pain.  Reportedly had some diaphoresis.  States he also had episodes going on with flex exertion today.  Recently started on warfarin for atrial fibrillation.  Saw his cardiologist last week.  Patient took nitroglycerin at home and and by EMS with improvement of the pain.  Also took aspirin.    Past Medical History:  Diagnosis Date  . Arthritis   . CAD (coronary artery disease) 10/21/2011   s/p stent placement  . Dyspnea   . Hyperlipidemia   . Hypertension   . Myocardial infarction [facility]) 2008 & 2013  . Renal insufficiency, mild   . Sinus bradycardia    Assymptomatic  . Tobacco user    Remote    Patient Active Problem List   Diagnosis Date Noted  . Total knee replacement status, left 06/30/2018  . Primary osteoarthritis of left knee 06/29/2018  . Degenerative arthritis of left knee 06/27/2018  . GERD (gastroesophageal reflux disease) 09/21/2015  . Chronic renal failure, stage 3 (moderate) 09/21/2015  . Pain in the chest   . Shortness of breath 03/25/2015  . Hyperlipidemia 10/07/2009  . HYPERTENSION, BENIGN 10/07/2009  . OLD MYOCARDIAL INFARCTION 10/07/2009  . CAD, NATIVE VESSEL 10/07/2009    Past Surgical History:  Procedure Laterality Date  . CARDIAC CATHETERIZATION  10/21/2011   s/p stent placement  . CARDIAC CATHETERIZATION  2008   stent placement  . CARDIAC CATHETERIZATION  2006   stent placement  . CARPAL TUNNEL RELEASE Right 07/22/2016   Procedure: CARPAL TUNNEL RELEASE;  Surgeon: Leanor Kail,  MD;  Location: [facility] ORS;  Service: Orthopedics;  Laterality: Right;  . JOINT REPLACEMENT    . TOTAL HIP ARTHROPLASTY     Right  . TOTAL KNEE ARTHROPLASTY Left 06/29/2018   Procedure: LEFT TOTAL KNEE ARTHROPLASTY;  Surgeon: Frederik Pear, MD;  Location: [place];  Service: Orthopedics;  Laterality: Left;  . TRANSURETHRAL RESECTION OF PROSTATE         Family History  Family history unknown: Yes    Social History   Tobacco Use  . Smoking status: Former Smoker    Packs/day: 1.00    Years: 20.00    Pack years: 20.00    Types: Cigarettes    Quit date: 12/06/1968    Years since quitting: 51.1  . Smokeless tobacco: Never Used  Substance Use Topics  . Alcohol use: No  . Drug use: No    Home Medications Prior to Admission medications   Medication Sig Start Date End Date Taking? Authorizing Provider  amLODipine (NORVASC) 5 MG tablet Take 5 mg by mouth daily. 03/05/19 03/04/20  [provider]  aspirin EC 81 MG tablet Take 1 tablet (81 mg total) by mouth daily. 03/15/19   Minna Merritts, MD  azelastine (ASTELIN) 0.1 % nasal spray Place 1 spray into the nose 2 (two) times daily. 08/30/18 01/14/20  [provider]  carvedilol (COREG) 3.125 MG tablet Take 2 tablets (6.25 mg total) by mouth 2 (two) times  daily with a meal. 01/14/20   Gollan, Kathlene November, MD  furosemide (LASIX) 40 MG tablet Take 1 tablet (40 mg total) by mouth daily as needed (for fluid retention.). 03/15/19   Minna Merritts, MD  losartan (COZAAR) 100 MG tablet Take 100 mg by mouth daily. 11/09/18   [provider]  lovastatin (MEVACOR) 40 MG tablet Take 40 mg by mouth at bedtime.    [provider]  nitroGLYCERIN (NITROSTAT) 0.4 MG SL tablet Place 0.4 mg under the tongue every 5 (five) minutes as needed for chest pain.    [provider]  oxyCODONE-acetaminophen (PERCOCET/ROXICET) 5-325 MG tablet Take 1 tablet by mouth every 4 (four) hours as needed for severe pain. 06/29/18   Leighton Parody, PA-C  tiZANidine (ZANAFLEX) 2 MG tablet Take 1 tablet (2 mg total) by mouth every 6 (six) hours as needed. 06/29/18   Leighton Parody, PA-C  warfarin (COUMADIN) 5 MG tablet Take 1 tablet (5 mg total) by mouth daily. 01/14/20   Minna Merritts, MD    Allergies    Amoxicillin  Review of Systems   Review of Systems  Constitutional: Negative for appetite change.  HENT: Negative for congestion.   Respiratory: Positive for shortness of breath.   Cardiovascular: Positive for chest pain. Negative for leg swelling.  Gastrointestinal: Negative for abdominal pain.  Genitourinary: Negative for flank pain.  Musculoskeletal: Negative for back pain.  Skin: Negative for rash.  Neurological: Negative for weakness.    Physical Exam Updated Vital Signs BP (!) 148/87   Pulse (!) 103   Temp 98.3 F (36.8 C) (Oral)   Resp 16   SpO2 96%   Physical Exam Vitals reviewed.  HENT:     Head: Atraumatic.  Cardiovascular:     Rate and Rhythm: Regular rhythm.  Pulmonary:     Breath sounds: No wheezing, rhonchi or rales.  Chest:     Chest wall: No tenderness.  Abdominal:     Tenderness: There is no abdominal tenderness.  Musculoskeletal:     Comments: Mild bilateral lower extremity edema  Skin:    Capillary Refill: Capillary refill takes less than 2 seconds.  Neurological:     Mental Status: He is alert.     ED Results / Procedures / Treatments   Labs (all labs ordered are listed, but only abnormal results are displayed) Labs Reviewed  COMPREHENSIVE METABOLIC PANEL - Abnormal; Notable for the following components:      Result Value   Potassium 6.1 (*)    Glucose, Bld 109 (*)    BUN 35 (*)    Creatinine, Ser 2.20 (*)    Calcium 8.5 (*)    Total Protein 6.1 (*)    GFR calc non Af Amer 26 (*)    GFR calc Af Amer 30 (*)    All other components within normal limits  CBC WITH DIFFERENTIAL/PLATELET - Abnormal; Notable for the following components:   RBC 4.21 (*)    MCV 105.9 (*)     MCH 34.2 (*)    Platelets 136 (*)    All other components within normal limits  PROTIME-INR - Abnormal; Notable for the following components:   Prothrombin Time 19.8 (*)    INR 1.7 (*)    All other components within normal limits  TROPONIN I (HIGH SENSITIVITY) - Abnormal; Notable for the following components:   Troponin I (High Sensitivity) 916 (*)    All other components within normal limits  RESPIRATORY PANEL  BY RT PCR (FLU A&B, COVID)  HEPARIN LEVEL (UNFRACTIONATED)  CBC    EKG EKG Interpretation  Date/Time:  Sunday January 19 2020 17:09:40 EST Ventricular Rate:  99 PR Interval:    QRS Duration: 171 QT Interval:  366 QTC Calculation: 470 R Axis:   22 Text Interpretation: Atrial fibrillation Left bundle branch block No significant change since last tracing Confirmed by Davonna Belling [phone]) on 01/19/2020 5:14:14 PM Also confirmed by Davonna Belling [phone])  on 01/19/2020 6:41:15 PM   Radiology DG Chest Portable 1 View  Result Date: 01/19/2020 CLINICAL DATA:  LEFT side chest pain for 1 month, associated LEFT arm pain, increased with exertion and relieved with rest, began today way and sitting, history coronary artery disease post MI, hypertension EXAM: PORTABLE CHEST 1 VIEW COMPARISON:  Portable exam 1723 hours compared to 03/06/2019 FINDINGS: Minimal enlargement of cardiac silhouette. Mediastinal contours and pulmonary vascularity normal. Lungs clear. No pulmonary infiltrate, pleural effusion or pneumothorax. Bones slightly demineralized. IMPRESSION: Minimal enlargement of cardiac silhouette. No acute abnormalities. Electronically Signed   By: Lavonia Dana M.D.   On: 01/19/2020 17:59    Procedures Procedures (including critical care time)  Medications Ordered in ED Medications  nitroGLYCERIN 50 mg in dextrose 5 % 250 mL (0.2 mg/mL) infusion (has no administration in time range)  heparin bolus via infusion 4,000 Units (has no administration in time range)  heparin ADULT  infusion 100 units/mL (25000 units/264mL sodium chloride 0.45%) (has no administration in time range)    ED Course  I have reviewed the triage vital signs and the nursing notes.  Pertinent labs & imaging results that were available during my care of the patient were reviewed by me and considered in my medical decision making (see chart for details).    MDM Rules/Calculators/A&P                      Patient with chest pain.  Has been going on for the last week.  Had previously been with exertion that was come on with less exertion with rest.  EKG shows left bundle branch with some nonspecific changes.  However troponin is at thousand.  Pain much improved after nitroglycerin at home, although does have some continued pain on the back of the left arm.  Has atrial fibrillation which is known and recently started on Coumadin.  However INR is 1.7.  Will start heparin since he is subtherapeutic.  Already had aspirin at home.  Will discuss with cardiology about more urgent intervention.  Nitroglycerin drip will be started.  CRITICAL CARE Performed by: Davonna Belling Total critical care time: 30 minutes Critical care time was exclusive of separately billable procedures and treating other patients. Critical care was necessary to treat or prevent imminent or life-threatening deterioration. Critical care was time spent personally by me on the following activities: development of treatment plan with patient and/or surrogate as well as nursing, discussions with consultants, evaluation of patient's response to treatment, examination of patient, obtaining history from patient or surrogate, ordering and performing treatments and interventions, ordering and review of laboratory studies, ordering and review of radiographic studies, pulse oximetry and re-evaluation of patient's condition.  Final Clinical Impression(s) / ED Diagnoses Final diagnoses:  None    Rx / DC Orders ED Discharge Orders    None        Davonna Belling, MD 01/19/20 [phone]

## 2020-01-19 NOTE — H&P (Signed)
TRH H&P    Patient Demographics:    [name] [name], is a 86 y.o. male  MRN: [id number]  DOB - 05-May-1931  Admit Date - 01/19/2020  Referring MD/NP/PA: Davonna Belling  Outpatient Primary MD for the patient is [name], MD [facility] - Cardiology  Patient coming from: home  Chief complaint-  Chest pain    HPI:    [name] [name]  is a 86 y.o. male,  w hypertension, hyperlipidemia, CKD stage3, Pafib (recent diagnosis), CAD s/p stent,   Chest pain starting at rest this afternoon, 1-2pm, left sided, with radiation to the left arm, "pressure",  Slight diaphoresis.   Took 2 aspirin, and 2 slg nitro at home with slight relief.  Pt presented for evaluation due to worsening chest discomfort.  Pt notes slight dyspnea w exertion and chest pain with exertion for 3 months.    Pt denies fever, cough, palp, n/v, diarrhea, brbpr, black stool.   In ED,  T 98.3, P 100, R 18, Bp 158/75  Pox 97% on RA  Na 138, K 6.1,k Bun 35, Creatinine 2.2 Ast 28, Alt 28 Wbc 6.5l hgb 14.4, plt 136 Trop 916 INR 1.7  CXR IMPRESSION: Minimal enlargement of cardiac silhouette. No acute abnormalities   Ekg afib at 100, lad, LBBB (old), but LBBB appears to have slightly increased in st elevation in v3, and LBBB new in v4, and biphasic t in v5,6   Pt will be admitted for chest pain, unstable,angina and hyperkalemia.    Review of systems:    In addition to the HPI above,  No Fever-chills, No Headache, No changes with Vision or hearing, No problems swallowing food or Liquids, No Cough  No Abdominal pain, No Nausea or Vomiting, bowel movements are regular, No Blood in stool or Urine, No dysuria, No new skin rashes or bruises, No new joints pains-aches,  No new weakness, tingling, numbness in any extremity, No recent weight gain or loss, No polyuria, polydypsia or polyphagia, No significant Mental  Stressors.  All other systems reviewed and are negative.    Past History of the following :    Past Medical History:  Diagnosis Date  . Arthritis   . CAD (coronary artery disease) 10/21/2011   s/p stent placement  . Dyspnea   . Hyperlipidemia   . Hypertension   . Myocardial infarction [facility]) 2008 & 2013  . Renal insufficiency, mild   . Sinus bradycardia    Assymptomatic  . Tobacco user    Remote      Past Surgical History:  Procedure Laterality Date  . CARDIAC CATHETERIZATION  10/21/2011   s/p stent placement  . CARDIAC CATHETERIZATION  2008   stent placement  . CARDIAC CATHETERIZATION  2006   stent placement  . CARPAL TUNNEL RELEASE Right 07/22/2016   Procedure: CARPAL TUNNEL RELEASE;  Surgeon: Leanor Kail, MD;  Location: [facility] ORS;  Service: Orthopedics;  Laterality: Right;  . JOINT REPLACEMENT    . TOTAL HIP ARTHROPLASTY     Right  . TOTAL KNEE ARTHROPLASTY  Left 06/29/2018   Procedure: LEFT TOTAL KNEE ARTHROPLASTY;  Surgeon: Frederik Pear, MD;  Location: [place];  Service: Orthopedics;  Laterality: Left;  . TRANSURETHRAL RESECTION OF PROSTATE        Social History:      Social History   Tobacco Use  . Smoking status: Former Smoker    Packs/day: 1.00    Years: 20.00    Pack years: 20.00    Types: Cigarettes    Quit date: 12/06/1968    Years since quitting: 51.1  . Smokeless tobacco: Never Used  Substance Use Topics  . Alcohol use: No       Family History :     Family History  Family history unknown: Yes       Home Medications:   Prior to Admission medications   Medication Sig Start Date End Date Taking? Authorizing Provider  amLODipine (NORVASC) 5 MG tablet Take 5 mg by mouth daily. 03/05/19 03/04/20 Yes [provider]  aspirin EC 81 MG tablet Take 1 tablet (81 mg total) by mouth daily. 03/15/19  Yes Minna Merritts, MD  carvedilol (COREG) 3.125 MG tablet Take 2 tablets (6.25 mg total) by mouth 2 (two) times daily with a meal.  01/14/20  Yes Gollan, Kathlene November, MD  furosemide (LASIX) 40 MG tablet Take 1 tablet (40 mg total) by mouth daily as needed (for fluid retention.). 03/15/19  Yes Gollan, Kathlene November, MD  losartan (COZAAR) 100 MG tablet Take 100 mg by mouth daily. 11/09/18  Yes [provider]  lovastatin (MEVACOR) 40 MG tablet Take 40 mg by mouth at bedtime.   Yes [provider]  nitroGLYCERIN (NITROSTAT) 0.4 MG SL tablet Place 0.4 mg under the tongue every 5 (five) minutes as needed for chest pain.   Yes [provider]  warfarin (COUMADIN) 5 MG tablet Take 1 tablet (5 mg total) by mouth daily. 01/14/20  Yes Gollan, Kathlene November, MD  azelastine (ASTELIN) 0.1 % nasal spray Place 1 spray into the nose 2 (two) times daily. 08/30/18 01/14/20  [provider]  oxyCODONE-acetaminophen (PERCOCET/ROXICET) 5-325 MG tablet Take 1 tablet by mouth every 4 (four) hours as needed for severe pain. Patient not taking: Reported on 01/19/2020 06/29/18   Leighton Parody, PA-C  tiZANidine (ZANAFLEX) 2 MG tablet Take 1 tablet (2 mg total) by mouth every 6 (six) hours as needed. Patient not taking: Reported on 01/19/2020 06/29/18   Leighton Parody, PA-C     Allergies:     Allergies  Allergen Reactions  . Amoxicillin Rash    Has patient had a PCN reaction causing immediate rash, facial/tongue/throat swelling, SOB or lightheadedness with hypotension: Unknown Has patient had a PCN reaction causing severe rash involving mucus membranes or skin necrosis: No Has patient had a PCN reaction that required hospitalization: No Has patient had a PCN reaction occurring within the last 10 years: Yes If all of the above answers are "NO", then may proceed with Cephalosporin use.      Physical Exam:   Vitals  Blood pressure (!) 147/93, pulse 95, temperature 98.3 F (36.8 C), temperature source Oral, resp. rate 17, SpO2 97 %.  1.  General: axoxo3  2. Psychiatric: Euthymic  3. Neurologic: Nonfocal, cn2-12  intact, reflexes 2+ symmetric, diffuse with no clonus, motor 5/5 in all 4 ext  4. HEENMT:  Anicteric, pupils 1.38mm symmetric, direct, consensual,  Neck: no jvd  5. Respiratory : CTAB  6. Cardiovascular : rrr s1, s2,  7. Gastrointestinal:  Abd: soft, nt, nd, +bs  8. Skin:  Ext: no c/c/e, no rash  9.Musculoskeletal:  Good ROM    Data Review:    CBC Recent Labs  Lab 01/19/20 1754  WBC 6.5  HGB 14.4  HCT 44.6  PLT 136*  MCV 105.9*  MCH 34.2*  MCHC 32.3  RDW 13.1  LYMPHSABS 1.6  MONOABS 0.5  EOSABS 0.2  BASOSABS 0.0   ------------------------------------------------------------------------------------------------------------------  Results for orders placed or performed during the hospital encounter of 01/19/20 (from the past 48 hour(s))  Respiratory Panel by RT PCR (Flu A&B, Covid) - Nasopharyngeal Swab     Status: None   Collection Time: 01/19/20  5:44 PM   Specimen: Nasopharyngeal Swab  Result Value Ref Range   SARS Coronavirus 2 by RT PCR NEGATIVE NEGATIVE    Comment: (NOTE) SARS-CoV-2 target nucleic acids are NOT DETECTED. The SARS-CoV-2 RNA is generally detectable in upper respiratoy specimens during the acute phase of infection. The lowest concentration of SARS-CoV-2 viral copies this assay can detect is 131 copies/mL. A negative result does not preclude SARS-Cov-2 infection and should not be used as the sole basis for treatment or other patient management decisions. A negative result may occur with  improper specimen collection/handling, submission of specimen other than nasopharyngeal swab, presence of viral mutation(s) within the areas targeted by this assay, and inadequate number of viral copies (<131 copies/mL). A negative result must be combined with clinical observations, patient history, and epidemiological information. The expected result is Negative. Fact Sheet for Patients:  PinkCheek.be Fact Sheet for  Healthcare Providers:  GravelBags.it This test is not yet ap proved or cleared by the [place] FDA and  has been authorized for detection and/or diagnosis of SARS-CoV-2 by FDA under an Emergency Use Authorization (EUA). This EUA will remain  in effect (meaning this test can be used) for the duration of the COVID-19 declaration under Section 564(b)(1) of the Act, 21 U.S.C. section 360bbb-3(b)(1), unless the authorization is terminated or revoked sooner.    Influenza A by PCR NEGATIVE NEGATIVE   Influenza B by PCR NEGATIVE NEGATIVE    Comment: (NOTE) The Xpert Xpress SARS-CoV-2/FLU/RSV assay is intended as an aid in  the diagnosis of influenza from Nasopharyngeal swab specimens and  should not be used as a sole basis for treatment. Nasal washings and  aspirates are unacceptable for Xpert Xpress SARS-CoV-2/FLU/RSV  testing. Fact Sheet for Patients: PinkCheek.be Fact Sheet for Healthcare Providers: GravelBags.it This test is not yet approved or cleared by the [place] FDA and  has been authorized for detection and/or diagnosis of SARS-CoV-2 by  FDA under an Emergency Use Authorization (EUA). This EUA will remain  in effect (meaning this test can be used) for the duration of the  Covid-19 declaration under Section 564(b)(1) of the Act, 21  U.S.C. section 360bbb-3(b)(1), unless the authorization is  terminated or revoked. Performed at [facility], [place] [address] [place], [place] [address]   Comprehensive metabolic panel     Status: Abnormal   Collection Time: 01/19/20  5:54 PM  Result Value Ref Range   Sodium 138 135 - 145 mmol/L   Potassium 6.1 (H) 3.5 - 5.1 mmol/L   Chloride 111 98 - 111 mmol/L   CO2 22 22 - 32 mmol/L   Glucose, Bld 109 (H) 70 - 99 mg/dL   BUN 35 (H) 8 - 23 mg/dL   Creatinine, Ser 2.20 (H) 0.61 - 1.24 mg/dL   Calcium 8.5 (  L) 8.9 - 10.3 mg/dL   Total Protein  6.1 (L) 6.5 - 8.1 g/dL   Albumin 3.5 3.5 - 5.0 g/dL   AST 28 15 - 41 U/L   ALT 28 0 - 44 U/L   Alkaline Phosphatase 41 38 - 126 U/L   Total Bilirubin 0.8 0.3 - 1.2 mg/dL   GFR calc non Af Amer 26 (L) >60 mL/min   GFR calc Af Amer 30 (L) >60 mL/min   Anion gap 5 5 - 15    Comment: Performed at [facility] [address], [place], [place] [address]  CBC with Differential     Status: Abnormal   Collection Time: 01/19/20  5:54 PM  Result Value Ref Range   WBC 6.5 4.0 - 10.5 K/uL   RBC 4.21 (L) 4.22 - 5.81 MIL/uL   Hemoglobin 14.4 13.0 - 17.0 g/dL   HCT 44.6 39.0 - 52.0 %   MCV 105.9 (H) 80.0 - 100.0 fL   MCH 34.2 (H) 26.0 - 34.0 pg   MCHC 32.3 30.0 - 36.0 g/dL   RDW 13.1 11.5 - 15.5 %   Platelets 136 (L) 150 - 400 K/uL   nRBC 0.0 0.0 - 0.2 %   Neutrophils Relative % 62 %   Neutro Abs 4.1 1.7 - 7.7 K/uL   Lymphocytes Relative 25 %   Lymphs Abs 1.6 0.7 - 4.0 K/uL   Monocytes Relative 8 %   Monocytes Absolute 0.5 0.1 - 1.0 K/uL   Eosinophils Relative 3 %   Eosinophils Absolute 0.2 0.0 - 0.5 K/uL   Basophils Relative 1 %   Basophils Absolute 0.0 0.0 - 0.1 K/uL   Immature Granulocytes 1 %   Abs Immature Granulocytes 0.03 0.00 - 0.07 K/uL    Comment: Performed at [facility] [address] [place], [place] [address]  Troponin I (High Sensitivity)     Status: Abnormal   Collection Time: 01/19/20  5:54 PM  Result Value Ref Range   Troponin I (High Sensitivity) 916 (HH) <18 ng/L    Comment: CRITICAL RESULT CALLED TO, READ BACK BY AND VERIFIED WITH: CRISCO,K RN @ 2297 01/19/20 LEONARD,A (NOTE) Elevated high sensitivity troponin I (hsTnI) values and significant  changes across serial measurements may suggest ACS but many other  chronic and acute conditions are known to elevate hsTnI results.  Refer to the Links section for chest pain algorithms and additional  guidance. Performed at [facility], [place] [address], [place], [place] [address]   Protime-INR      Status: Abnormal   Collection Time: 01/19/20  5:55 PM  Result Value Ref Range   Prothrombin Time 19.8 (H) 11.4 - 15.2 seconds   INR 1.7 (H) 0.8 - 1.2    Comment: (NOTE) INR goal varies based on device and disease states. Performed at [facility], [place] [address], [place], [place] [address]     Chemistries  Recent Labs  Lab 01/19/20 1754  NA 138  K 6.1*  CL 111  CO2 22  GLUCOSE 109*  BUN 35*  CREATININE 2.20*  CALCIUM 8.5*  AST 28  ALT 28  ALKPHOS 41  BILITOT 0.8   ------------------------------------------------------------------------------------------------------------------  ------------------------------------------------------------------------------------------------------------------ GFR: Estimated Creatinine Clearance: 27.5 mL/min (A) (by C-G formula based on SCr of 2.2 mg/dL (H)). Liver Function Tests: Recent Labs  Lab 01/19/20 1754  AST 28  ALT 28  ALKPHOS 41  BILITOT 0.8  PROT 6.1*  ALBUMIN 3.5  No results for input(s): LIPASE, AMYLASE in the last 168 hours. No results for input(s): AMMONIA in the last 168 hours. Coagulation Profile: Recent Labs  Lab 01/19/20 1755  INR 1.7*   Cardiac Enzymes: No results for input(s): CKTOTAL, CKMB, CKMBINDEX, TROPONINI in the last 168 hours. BNP (last 3 results) No results for input(s): PROBNP in the last 8760 hours. HbA1C: No results for input(s): HGBA1C in the last 72 hours. CBG: No results for input(s): GLUCAP in the last 168 hours. Lipid Profile: No results for input(s): CHOL, HDL, LDLCALC, TRIG, CHOLHDL, LDLDIRECT in the last 72 hours. Thyroid Function Tests: No results for input(s): TSH, T4TOTAL, FREET4, T3FREE, THYROIDAB in the last 72 hours. Anemia Panel: No results for input(s): VITAMINB12, FOLATE, FERRITIN, TIBC, IRON, RETICCTPCT in the last 72 hours.  --------------------------------------------------------------------------------------------------------------- Urine analysis:     Component Value Date/Time   COLORURINE YELLOW 06/20/2018 1032   APPEARANCEUR CLEAR 06/20/2018 1032   LABSPEC 1.015 06/20/2018 1032   PHURINE 5.0 06/20/2018 1032   GLUCOSEU NEGATIVE 06/20/2018 1032   HGBUR SMALL (A) 06/20/2018 1032   BILIRUBINUR NEGATIVE 06/20/2018 1032   KETONESUR NEGATIVE 06/20/2018 1032   PROTEINUR NEGATIVE 06/20/2018 1032   UROBILINOGEN 0.2 08/03/2015 0925   NITRITE NEGATIVE 06/20/2018 1032   LEUKOCYTESUR NEGATIVE 06/20/2018 1032      Imaging Results:    DG Chest Portable 1 View  Result Date: 01/19/2020 CLINICAL DATA:  LEFT side chest pain for 1 month, associated LEFT arm pain, increased with exertion and relieved with rest, began today way and sitting, history coronary artery disease post MI, hypertension EXAM: PORTABLE CHEST 1 VIEW COMPARISON:  Portable exam 1723 hours compared to 03/06/2019 FINDINGS: Minimal enlargement of cardiac silhouette. Mediastinal contours and pulmonary vascularity normal. Lungs clear. No pulmonary infiltrate, pleural effusion or pneumothorax. Bones slightly demineralized. IMPRESSION: Minimal enlargement of cardiac silhouette. No acute abnormalities. Electronically Signed   By: Lavonia Dana M.D.   On: 01/19/2020 17:59     Assessment & Plan:    Active Problems:   Chest pain  Chest pain  Tele  Trop  Hga1c,Lipid Heparin gtt Aspirin Cont Carvedilol 6.25mg  po bid Cont Amlodpine 5mg  po qday Cont Lasix 40mg  po qday Cont Mevacor 40mg  po qhs  Hyperkalemia Calcium gluconate 1gm iv x1 Sodium bicarb 1 amp iv x1 Lokelma   Check bmp   ARF on CKD stage3 Hydrate with ns iv Check cmp in am  PAfib  Hepatin gtt   DVT Prophylaxis-   Heparin  SCDs   AM Labs Ordered, also please review Full Orders  Family Communication: Admission, patients condition and plan of care including tests being ordered have been discussed with the patient  who indicate understanding and agree with the plan and Code Status.  Code Status:  FULL CODE per  patient,   Admission status: Inpatientt: Based on patients clinical presentation and evaluation of above clinical data, I have made determination that patient meets Inpatient criteria at this time.  Time spent in minutes : 70 minutes   Jani Gravel M.D on 01/19/2020 at 7:43 PM

## 2020-01-19 NOTE — ED Notes (Signed)
Pt transported to [facility] in [facility] with all belongings via cart transport on continuous monitors. Pt alert, speaking in full sentences. Breathing easy, non-labored. VSS. Pt reporting relief in CP.

## 2020-01-20 ENCOUNTER — Encounter (HOSPITAL_COMMUNITY)
Admission: EM | Disposition: A | Discharge status: Home / Self Care | Payer: Self-pay | Source: Home / Self Care | Attending: Internal Medicine

## 2020-01-20 ENCOUNTER — Inpatient Hospital Stay (HOSPITAL_COMMUNITY): Payer: PPO

## 2020-01-20 DIAGNOSIS — R778 Other specified abnormalities of plasma proteins: Secondary | ICD-10-CM

## 2020-01-20 DIAGNOSIS — I4819 Other persistent atrial fibrillation: Secondary | ICD-10-CM

## 2020-01-20 DIAGNOSIS — N179 Acute kidney failure, unspecified: Secondary | ICD-10-CM

## 2020-01-20 DIAGNOSIS — R079 Chest pain, unspecified: Secondary | ICD-10-CM

## 2020-01-20 DIAGNOSIS — I255 Ischemic cardiomyopathy: Secondary | ICD-10-CM

## 2020-01-20 DIAGNOSIS — I25118 Atherosclerotic heart disease of native coronary artery with other forms of angina pectoris: Secondary | ICD-10-CM

## 2020-01-20 DIAGNOSIS — N184 Chronic kidney disease, stage 4 (severe): Secondary | ICD-10-CM

## 2020-01-20 HISTORY — PX: LEFT HEART CATH AND CORONARY ANGIOGRAPHY: CATH118249

## 2020-01-20 LAB — LIPID PANEL
Cholesterol: 110 mg/dL (ref 0–200)
HDL: 26 mg/dL — ABNORMAL LOW (ref >40)
LDL Cholesterol: 53 mg/dL (ref 0–99)
Total CHOL/HDL Ratio: 4.2 RATIO
Triglycerides: 153 mg/dL — ABNORMAL HIGH (ref <150)
VLDL: 31 mg/dL (ref 0–40)

## 2020-01-20 LAB — ECHOCARDIOGRAM COMPLETE
Height: 66 in
Weight: 3569.69 oz

## 2020-01-20 LAB — CBC
HCT: 43.4 % (ref 39.0–52.0)
Hemoglobin: 14.2 g/dL (ref 13.0–17.0)
MCH: 34.1 pg — ABNORMAL HIGH (ref 26.0–34.0)
MCHC: 32.7 g/dL (ref 30.0–36.0)
MCV: 104.3 fL — ABNORMAL HIGH (ref 80.0–100.0)
Platelets: 121 10*3/uL — ABNORMAL LOW (ref 150–400)
RBC: 4.16 MIL/uL — ABNORMAL LOW (ref 4.22–5.81)
RDW: 13.2 % (ref 11.5–15.5)
WBC: 8.1 10*3/uL (ref 4.0–10.5)
nRBC: 0 % (ref 0.0–0.2)

## 2020-01-20 LAB — PROTIME-INR
INR: 1.8 — ABNORMAL HIGH (ref 0.8–1.2)
Prothrombin Time: 20.7 seconds — ABNORMAL HIGH (ref 11.4–15.2)

## 2020-01-20 LAB — COMPREHENSIVE METABOLIC PANEL
ALT: 27 U/L (ref 0–44)
AST: 44 U/L — ABNORMAL HIGH (ref 15–41)
Albumin: 3.4 g/dL — ABNORMAL LOW (ref 3.5–5.0)
Alkaline Phosphatase: 39 U/L (ref 38–126)
Anion gap: 9 (ref 5–15)
BUN: 36 mg/dL — ABNORMAL HIGH (ref 8–23)
CO2: 23 mmol/L (ref 22–32)
Calcium: 8.8 mg/dL — ABNORMAL LOW (ref 8.9–10.3)
Chloride: 107 mmol/L (ref 98–111)
Creatinine, Ser: 2.25 mg/dL — ABNORMAL HIGH (ref 0.61–1.24)
GFR calc Af Amer: 30 mL/min — ABNORMAL LOW (ref >60)
GFR calc non Af Amer: 25 mL/min — ABNORMAL LOW (ref >60)
Glucose, Bld: 115 mg/dL — ABNORMAL HIGH (ref 70–99)
Potassium: 5.3 mmol/L — ABNORMAL HIGH (ref 3.5–5.1)
Sodium: 139 mmol/L (ref 135–145)
Total Bilirubin: 0.6 mg/dL (ref 0.3–1.2)
Total Protein: 5.9 g/dL — ABNORMAL LOW (ref 6.5–8.1)

## 2020-01-20 LAB — HEPARIN LEVEL (UNFRACTIONATED)
Heparin Unfractionated: 0.57 IU/mL (ref 0.30–0.70)
Heparin Unfractionated: 0.83 IU/mL — ABNORMAL HIGH (ref 0.30–0.70)

## 2020-01-20 LAB — HEMOGLOBIN A1C
Hgb A1c MFr Bld: 6 % — ABNORMAL HIGH (ref 4.8–5.6)
Mean Plasma Glucose: 125.5 mg/dL

## 2020-01-20 SURGERY — LEFT HEART CATH AND CORONARY ANGIOGRAPHY
Anesthesia: LOCAL

## 2020-01-20 MED ORDER — WARFARIN SODIUM 5 MG PO TABS
5.0000 mg | ORAL_TABLET | Freq: Once | ORAL | Status: AC
  Administered 2020-01-20: 5 mg via ORAL
  Filled 2020-01-20: qty 1

## 2020-01-20 MED ORDER — HEPARIN SODIUM (PORCINE) 1000 UNIT/ML IJ SOLN
INTRAMUSCULAR | Status: DC | PRN
  Administered 2020-01-20: 5000 [IU] via INTRAVENOUS

## 2020-01-20 MED ORDER — WARFARIN - PHARMACIST DOSING INPATIENT: Freq: Every day | Status: DC

## 2020-01-20 MED ORDER — SODIUM CHLORIDE 0.9 % WEIGHT BASED INFUSION
3.0000 mL/kg/h | INTRAVENOUS | Status: DC
  Administered 2020-01-20: 3 mL/kg/h via INTRAVENOUS

## 2020-01-20 MED ORDER — ASPIRIN 81 MG PO CHEW: 81.0000 mg | CHEWABLE_TABLET | ORAL | Status: DC

## 2020-01-20 MED ORDER — HEPARIN (PORCINE) IN NACL 1000-0.9 UT/500ML-% IV SOLN
INTRAVENOUS | Status: AC
  Filled 2020-01-20: qty 500

## 2020-01-20 MED ORDER — SODIUM CHLORIDE 0.9 % IV SOLN: 250.0000 mL | INTRAVENOUS | Status: DC | PRN

## 2020-01-20 MED ORDER — HEPARIN (PORCINE) IN NACL 1000-0.9 UT/500ML-% IV SOLN
INTRAVENOUS | Status: DC | PRN
  Administered 2020-01-20 (×2): 500 mL

## 2020-01-20 MED ORDER — PERFLUTREN LIPID MICROSPHERE
INTRAVENOUS | Status: AC
  Filled 2020-01-20: qty 10

## 2020-01-20 MED ORDER — LIDOCAINE HCL (PF) 1 % IJ SOLN
INTRAMUSCULAR | Status: AC
  Filled 2020-01-20: qty 30

## 2020-01-20 MED ORDER — PERFLUTREN LIPID MICROSPHERE
1.0000 mL | INTRAVENOUS | Status: AC | PRN
  Administered 2020-01-20: 3 mL via INTRAVENOUS
  Filled 2020-01-20: qty 10

## 2020-01-20 MED ORDER — HEPARIN (PORCINE) 25000 UT/250ML-% IV SOLN
1100.0000 [IU]/h | INTRAVENOUS | Status: DC
  Administered 2020-01-20: 1000 [IU]/h via INTRAVENOUS
  Filled 2020-01-20: qty 250

## 2020-01-20 MED ORDER — HEPARIN SODIUM (PORCINE) 1000 UNIT/ML IJ SOLN
INTRAMUSCULAR | Status: AC
  Filled 2020-01-20: qty 1

## 2020-01-20 MED ORDER — SODIUM CHLORIDE 0.9% FLUSH: 3.0000 mL | Freq: Two times a day (BID) | INTRAVENOUS | Status: DC

## 2020-01-20 MED ORDER — LIDOCAINE HCL (PF) 1 % IJ SOLN
INTRAMUSCULAR | Status: DC | PRN
  Administered 2020-01-20: 2 mL

## 2020-01-20 MED ORDER — SODIUM CHLORIDE 0.9% FLUSH: 3.0000 mL | INTRAVENOUS | Status: DC | PRN

## 2020-01-20 MED ORDER — VERAPAMIL HCL 2.5 MG/ML IV SOLN
INTRAVENOUS | Status: AC
  Filled 2020-01-20: qty 2

## 2020-01-20 MED ORDER — IOHEXOL 350 MG/ML SOLN
INTRAVENOUS | Status: DC | PRN
  Administered 2020-01-20: 30 mL

## 2020-01-20 MED ORDER — SODIUM CHLORIDE 0.9 % WEIGHT BASED INFUSION: 1.0000 mL/kg/h | INTRAVENOUS | Status: DC

## 2020-01-20 SURGICAL SUPPLY — 9 items

## 2020-01-20 NOTE — Progress Notes (Addendum)
Progress Note  Patient Name: [name] [name] Date of Encounter: 01/20/2020  Primary Cardiologist: Ida Rogue, MD   Subjective   Still having some mild CP, left arm pain. Less severe than with prior cath.   Inpatient Medications    Scheduled Meds: . amLODipine  5 mg Oral Daily  . aspirin EC  81 mg Oral Daily  . carvedilol  6.25 mg Oral BID WC  . pravastatin  40 mg Oral q1800   Continuous Infusions: . heparin 1,200 Units/hr (01/19/20 1941)  . nitroGLYCERIN 5 mcg/min (01/19/20 1956)   PRN Meds: acetaminophen **OR** acetaminophen, furosemide   Vital Signs    Vitals:   01/20/20 0315 01/20/20 0330 01/20/20 0336 01/20/20 0736  BP: 133/86 129/73 129/73 (!) 141/85  Pulse: 74 86 89 86  Resp: 17 19 18 18   Temp:   97.6 F (36.4 C) 97.8 F (36.6 C)  TempSrc:   Oral Oral  SpO2: 94% 98% 97% 95%  Weight:   101.2 kg   Height:        Intake/Output Summary (Last 24 hours) at 01/20/2020 5053 Last data filed at 01/20/2020 0800 Gross per 24 hour  Intake 653.79 ml  Output 500 ml  Net 153.79 ml   Last 3 Weights 01/20/2020 01/19/2020 01/14/2020  Weight (lbs) 223 lb 1.7 oz 223 lb 1.7 oz 233 lb  Weight (kg) 101.2 kg 101.2 kg 105.688 kg      Telemetry    AFIB LBBB 98 - Personally Reviewed  ECG    LBBB AFIB 99 - Personally Reviewed  Physical Exam   GEN: No acute distress.   Neck: No JVD Cardiac: RRR, no murmurs, rubs, or gallops.  Respiratory: Clear to auscultation bilaterally. GI: Soft, nontender, non-distended  MS: No edema; No deformity. Neuro:  Nonfocal  Psych: Normal affect   Labs    High Sensitivity Troponin:   Recent Labs  Lab 01/19/20 1754 01/19/20 2000 01/19/20 2240  TROPONINIHS 916* 3,275* 5,845*      Chemistry Recent Labs  Lab 01/19/20 1754 01/19/20 2240 01/20/20 0233  NA 138 138 139  K 6.1* 5.4* 5.3*  CL 111 108 107  CO2 22 21* 23  GLUCOSE 109* 136* 115*  BUN 35* 36* 36*  CREATININE 2.20* 2.19* 2.25*  CALCIUM 8.5* 8.9 8.8*  PROT 6.1*   --  5.9*  ALBUMIN 3.5  --  3.4*  AST 28  --  44*  ALT 28  --  27  ALKPHOS 41  --  39  BILITOT 0.8  --  0.6  GFRNONAA 26* 26* 25*  GFRAA 30* 30* 30*  ANIONGAP 5 9 9      Hematology Recent Labs  Lab 01/19/20 1754 01/20/20 0233  WBC 6.5 8.1  RBC 4.21* 4.16*  HGB 14.4 14.2  HCT 44.6 43.4  MCV 105.9* 104.3*  MCH 34.2* 34.1*  MCHC 32.3 32.7  RDW 13.1 13.2  PLT 136* 121*    BNPNo results for input(s): BNP, PROBNP in the last 168 hours.   DDimer No results for input(s): DDIMER in the last 168 hours.   Radiology    DG Chest Portable 1 View  Result Date: 01/19/2020 CLINICAL DATA:  LEFT side chest pain for 1 month, associated LEFT arm pain, increased with exertion and relieved with rest, began today way and sitting, history coronary artery disease post MI, hypertension EXAM: PORTABLE CHEST 1 VIEW COMPARISON:  Portable exam 1723 hours compared to 03/06/2019 FINDINGS: Minimal enlargement of cardiac silhouette. Mediastinal contours and pulmonary  vascularity normal. Lungs clear. No pulmonary infiltrate, pleural effusion or pneumothorax. Bones slightly demineralized. IMPRESSION: Minimal enlargement of cardiac silhouette. No acute abnormalities. Electronically Signed   By: Lavonia Dana M.D.   On: 01/19/2020 17:59    Cardiac Studies   ECHO P  Patient Profile     86 y.o. male here with non-ST elevation myocardial infarction, atrial fibrillation persistent, known CAD with multiple PCI's, CKD stage III  Assessment & Plan    Non-ST elevation myocardial infarction -Troponins increased from 900 up to 5000 with chest pain, left arm pain consistent with myocardial infarction. -Currently on heparin IV, aspirin, carvedilol 6.25 twice daily and pravastatin 40. -Nitroglycerin drip -IV hydration -Plan for cardiac catheterization. Checking INR stat this am. If increased from 1.7 hold off until tomorrow on cath.  Creatinine is ranged from 1.5-2.2, was 2.2 in July 2019.  Chronic kidney disease has  been present.  Renal protection with hydration. Risks and benefits explained including renal dysfunction, stroke, MI, death, bleeding. Willing to proceed.    Hyperlipidemia -On pravastatin 40-LDL 53  Chronic kidney disease stage stage III/IV -Fairly stable over the past 2 years.  Will be at increased risk for dye administration cardiac catheterization however benefits outweigh risk.  Continue to optimize with IV hydration.  Careful administration given his EF previously reduced, ranging from 35-50 according to prior records.  Persistent atrial fibrillation -Currently on IV heparin.  Did not wish to take Eliquis secondary to cost.  Did not previously want to take Coumadin because of hassle.  Rate is currently reasonably controlled.  Increased risk of embolic stroke without anticoagulation.  CHA2DS2-VASc score of at least 4 -Last week he started Coumadin at the request of Dr. Rockey Situ.  His INR on 01/19/2020 at 1755 was 1.7.  I will recheck this morning.    Left bundle branch block -Chronic, prior EKG from March reviewed, was in sinus rhythm at the time.  Ischemic cardiomyopathy -EF has been reduced as described above.  Watch with fluid hydration. -Stress test August 2016 showed fixed defect old anterior MI with no ischemia -EF in 2016 was 45 to 50% with apical hypokinesis -Cardiac catheterization October 2012 showed left main shows no disease, LAD has 30% proximal disease, 2 overlapping stents in the mid region, 95% ostial diagonal disease jailed by the stents, 95% proximal OM 2 disease, mild plaque in the RCA, hypokinesis of the anterolateral wall, apical region and inferoapical wall with ejection fraction 30-35%.      For questions or updates, please contact [facility] Please consult [email or web address] for contact info under        Signed, Candee Furbish, MD  01/20/2020, 8:22 AM    Addendum-INR last evening 1.7, currently this morning 1.8, fairly stable.  I discussed with Anderson Malta in Cath  Lab as well as Dr. Irish Lack.  We will try to avoid groin access.  Radial access preferable to reduce bleeding risks.  Willing to proceed.  Candee Furbish, MD

## 2020-01-20 NOTE — Progress Notes (Signed)
PROGRESS NOTE    [name] [name]    Code Status: Full Code  FTD:[id number] DOB: 09/26/1931 DOA: 01/19/2020  PCP: [name], MD    Hospital Summary  This is an 86 year old male with history of hypertension, hyperlipidemia, CKD 3, paroxysmal atrial fibrillation, CAD status post stent who was admitted on 1/24 with complaints of chest pain and radiation to left arm with associated diaphoresis.  Improved with aspirin and nitroglycerin at home.  Initial troponin 916 was evaluated by cardiology and started on heparin drip.  Troponin increased from 916>> 5845, remained hemodynamically stable.  Echo with EF 20 to 25% reduced from previous 40 to 45%.  Patient went for cardiac catheterization on 1/25 which showed nonobstructive CAD, continued on medical therapy and resumed IV heparin and Coumadin and cardio plan to follow-up in a.m. 1/26.  A & P   Principal Problem:   Chest pain Active Problems:   Hyperlipidemia   HYPERTENSION, BENIGN   NSTEMI (non-ST elevated myocardial infarction) (HCC)   CAD, NATIVE VESSEL   Chronic renal failure, stage 3 (moderate)   Hyperkalemia   Renal failure (ARF), acute on chronic (HCC)   Elevated troponin   1. NSTEMI status post cardiac catheterization 1/25 a. Troponin 916->>5000+ b. Cath Today with nonobstructive CAD c. To restart IV heparin and Coumadin this evening d. Continue current meds e. Follow-up with cardio in a.m. 2. Persistent atrial fibrillation a. Not on Eliquis due to cost, currently on Coumadin since last week, INR 1.8 3. Chronic LBBB PAD 4. Ischemic cardiomyopathy a. EF in 2016 45 to 50% with apical hypokinesis, EF today: 20-25 % b. Continue Coreg c. Appreciate cardiology recommendations 5. Hyperkalemia a. Given calcium gluconate and sodium bicarb x1 and received Lokelma in ED b. No repeat labs, will follow up in a.m. 6. AKI on CKD 3, likely from NSTEMI and hemodynamic changes a. Creatinine 2.25, baseline 1.5  b. Follow-up in  a.m.  DVT prophylaxis: Heparin Diet: Heart healthy Family Communication: Patient's wife has been updated by phone Disposition Plan: Inpatient, continue discharge planning hopefully in next 24 to 48 hours  Consultants  Cardiology  Procedures  Left heart cath 1/25  Antibiotics   Anti-infectives (From admission, onward)   None           Subjective   Examined at bedside prior to cardiac catheterization.  Patient states his chest pain and palpitations had resolved.  Asymptomatic at time of encounter.  Otherwise no complaints  Objective   Vitals:   01/20/20 1330 01/20/20 1335 01/20/20 1340 01/20/20 1400  BP: 130/78 123/79 124/73 101/68  Pulse: (!) 172 78 87 79  Resp: 17 10 (!) 32 14  Temp:      TempSrc:      SpO2: 95% 93% (!) 0% 98%  Weight:      Height:        Intake/Output Summary (Last 24 hours) at 01/20/2020 1650 Last data filed at 01/20/2020 1226 Gross per 24 hour  Intake 707.79 ml  Output 950 ml  Net -242.21 ml   Filed Weights   01/19/20 2114 01/20/20 0336  Weight: 101.2 kg 101.2 kg    Examination:  Physical Exam Vitals and nursing note reviewed.  HENT:     Head: Normocephalic.  Cardiovascular:     Rate and Rhythm: Normal rate. Rhythm irregular.     Heart sounds: Normal heart sounds.  Pulmonary:     Effort: Pulmonary effort is normal.     Breath sounds: Normal breath sounds.  Abdominal:     General: Bowel sounds are normal.     Palpations: Abdomen is soft.  Musculoskeletal:     Right lower leg: No tenderness. No edema.     Left lower leg: No tenderness. No edema.  Neurological:     General: No focal deficit present.     Mental Status: He is alert.  Psychiatric:        Mood and Affect: Mood normal.     Data Reviewed: I have personally reviewed following labs and imaging studies  CBC: Recent Labs  Lab 01/19/20 1754 01/20/20 0233  WBC 6.5 8.1  NEUTROABS 4.1  --   HGB 14.4 14.2  HCT 44.6 43.4  MCV 105.9* 104.3*  PLT 136* 121*    Basic Metabolic Panel: Recent Labs  Lab 01/19/20 1754 01/19/20 2240 01/20/20 0233  NA 138 138 139  K 6.1* 5.4* 5.3*  CL 111 108 107  CO2 22 21* 23  GLUCOSE 109* 136* 115*  BUN 35* 36* 36*  CREATININE 2.20* 2.19* 2.25*  CALCIUM 8.5* 8.9 8.8*   GFR: Estimated Creatinine Clearance: 26.3 mL/min (A) (by C-G formula based on SCr of 2.25 mg/dL (H)). Liver Function Tests: Recent Labs  Lab 01/19/20 1754 01/20/20 0233  AST 28 44*  ALT 28 27  ALKPHOS 41 39  BILITOT 0.8 0.6  PROT 6.1* 5.9*  ALBUMIN 3.5 3.4*   No results for input(s): LIPASE, AMYLASE in the last 168 hours. No results for input(s): AMMONIA in the last 168 hours. Coagulation Profile: Recent Labs  Lab 01/19/20 1755 01/20/20 0905  INR 1.7* 1.8*   Cardiac Enzymes: No results for input(s): CKTOTAL, CKMB, CKMBINDEX, TROPONINI in the last 168 hours. BNP (last 3 results) No results for input(s): PROBNP in the last 8760 hours. HbA1C: Recent Labs    01/20/20 0233  HGBA1C 6.0*   CBG: No results for input(s): GLUCAP in the last 168 hours. Lipid Profile: Recent Labs    01/20/20 0233  CHOL 110  HDL 26*  LDLCALC 53  TRIG 153*  CHOLHDL 4.2   Thyroid Function Tests: No results for input(s): TSH, T4TOTAL, FREET4, T3FREE, THYROIDAB in the last 72 hours. Anemia Panel: No results for input(s): VITAMINB12, FOLATE, FERRITIN, TIBC, IRON, RETICCTPCT in the last 72 hours. Sepsis Labs: No results for input(s): PROCALCITON, LATICACIDVEN in the last 168 hours.  Recent Results (from the past 240 hour(s))  Respiratory Panel by RT PCR (Flu A&B, Covid) - Nasopharyngeal Swab     Status: None   Collection Time: 01/19/20  5:44 PM   Specimen: Nasopharyngeal Swab  Result Value Ref Range Status   SARS Coronavirus 2 by RT PCR NEGATIVE NEGATIVE Final    Comment: (NOTE) SARS-CoV-2 target nucleic acids are NOT DETECTED. The SARS-CoV-2 RNA is generally detectable in upper respiratoy specimens during the acute phase of  infection. The lowest concentration of SARS-CoV-2 viral copies this assay can detect is 131 copies/mL. A negative result does not preclude SARS-Cov-2 infection and should not be used as the sole basis for treatment or other patient management decisions. A negative result may occur with  improper specimen collection/handling, submission of specimen other than nasopharyngeal swab, presence of viral mutation(s) within the areas targeted by this assay, and inadequate number of viral copies (<131 copies/mL). A negative result must be combined with clinical observations, patient history, and epidemiological information. The expected result is Negative. Fact Sheet for Patients:  PinkCheek.be Fact Sheet for Healthcare Providers:  GravelBags.it This test is not yet ap proved  or cleared by the [place] and  has been authorized for detection and/or diagnosis of SARS-CoV-2 by FDA under an Emergency Use Authorization (EUA). This EUA will remain  in effect (meaning this test can be used) for the duration of the COVID-19 declaration under Section 564(b)(1) of the Act, 21 U.S.C. section 360bbb-3(b)(1), unless the authorization is terminated or revoked sooner.    Influenza A by PCR NEGATIVE NEGATIVE Final   Influenza B by PCR NEGATIVE NEGATIVE Final    Comment: (NOTE) The Xpert Xpress SARS-CoV-2/FLU/RSV assay is intended as an aid in  the diagnosis of influenza from Nasopharyngeal swab specimens and  should not be used as a sole basis for treatment. Nasal washings and  aspirates are unacceptable for Xpert Xpress SARS-CoV-2/FLU/RSV  testing. Fact Sheet for Patients: PinkCheek.be Fact Sheet for Healthcare Providers: GravelBags.it This test is not yet approved or cleared by the [place] FDA and  has been authorized for detection and/or diagnosis of SARS-CoV-2 by  FDA under  an Emergency Use Authorization (EUA). This EUA will remain  in effect (meaning this test can be used) for the duration of the  Covid-19 declaration under Section 564(b)(1) of the Act, 21  U.S.C. section 360bbb-3(b)(1), unless the authorization is  terminated or revoked. Performed at [facility], [place] [address] [place], [place] [address]   MRSA PCR Screening     Status: None   Collection Time: 01/19/20  9:35 PM   Specimen: Nasopharyngeal  Result Value Ref Range Status   MRSA by PCR NEGATIVE NEGATIVE Final    Comment:        The GeneXpert MRSA Assay (FDA approved for NASAL specimens only), is one component of a comprehensive MRSA colonization surveillance program. It is not intended to diagnose MRSA infection nor to guide or monitor treatment for MRSA infections. Performed at [facility], [place] [address], [place], [place] [address]          Radiology Studies: CARDIAC CATHETERIZATION  Result Date: 01/20/2020  Previously placed 2nd Mrg stent (unknown type) is widely patent.  Previously placed Mid LAD stent (unknown type) is widely patent.  Prox LAD to Mid LAD lesion is 25% stenosed.  2nd Diag lesion is 80% stenosed.  LV end diastolic pressure is normal.  There is no aortic valve stenosis.  Nonobstructive CAD. Continue medical therapy.  OK to restart warfarin tonight.  Resume IV heparin tonight as well 8 hours post sheath pull. INR in AM.   DG Chest Portable 1 View  Result Date: 01/19/2020 CLINICAL DATA:  LEFT side chest pain for 1 month, associated LEFT arm pain, increased with exertion and relieved with rest, began today way and sitting, history coronary artery disease post MI, hypertension EXAM: PORTABLE CHEST 1 VIEW COMPARISON:  Portable exam 1723 hours compared to 03/06/2019 FINDINGS: Minimal enlargement of cardiac silhouette. Mediastinal contours and pulmonary vascularity normal. Lungs clear. No pulmonary infiltrate, pleural effusion or pneumothorax. Bones  slightly demineralized. IMPRESSION: Minimal enlargement of cardiac silhouette. No acute abnormalities. Electronically Signed   By: Lavonia Dana M.D.   On: 01/19/2020 17:59   ECHOCARDIOGRAM COMPLETE  Result Date: 01/20/2020   ECHOCARDIOGRAM REPORT   Patient Name:   [name] [name] Date of Exam: 01/20/2020 Medical Rec #:  [id number]       Height:       66.0 in Accession #:    [id number]      Weight:       223.1 lb Date of Birth:  15-Oct-1931       BSA:          2.09 m Patient Age:    23 years        BP:           141/85 mmHg Patient Gender: M               HR:           72 bpm. Exam Location:  Inpatient Procedure: 2D Echo, Cardiac Doppler, Color Doppler and Intracardiac            Opacification Agent Indications:    Chest Pain 786.05  History:        Patient has prior history of Echocardiogram examinations, most                 recent 08/04/2015. CAD, Arrythmias:non-specific ST changes,                 Signs/Symptoms:Chest Pain and Shortness of Breath; Risk                 Factors:Hypertension, Dyslipidemia and Former Smoker. GERD.  Sonographer:    Paulita Fujita RDCS Referring Phys: [id number] Giordano Getman E Zelena Bushong  Sonographer Comments: Technically difficult study due to poor echo windows. Image acquisition challenging due to respiratory motion. IMPRESSIONS  1. Left ventricular ejection fraction, by visual estimation, is 20 to 25%. The left ventricle has normal function. Left ventricular septal wall thickness was normal. Normal left ventricular posterior wall thickness. There is no left ventricular hypertrophy.  2. Definity contrast agent was given IV to delineate the left ventricular endocardial borders.  3. Left ventricular diastolic parameters are indeterminate.  4. Moderately dilated left ventricular internal cavity size.  5. The left ventricle demonstrates regional wall motion abnormalities.  6. Global hypokinesis. Akinesis of the mid-apical septum, mid-apical inferior, and apical myocardium.  7. Global right ventricle has  mildly reduced systolic function.The right ventricular size is normal. No increase in right ventricular wall thickness.  8. Left atrial size was normal.  9. Right atrial size was normal. 10. The mitral valve is normal in structure. Trivial mitral valve regurgitation. No evidence of mitral stenosis. 11. The tricuspid valve is normal in structure. 12. The tricuspid valve is normal in structure. Tricuspid valve regurgitation is not demonstrated. 13. The aortic valve is normal in structure. Aortic valve regurgitation is not visualized. No evidence of aortic valve sclerosis or stenosis. 14. The pulmonic valve was normal in structure. Pulmonic valve regurgitation is not visualized. 15. The inferior vena cava is normal in size with greater than 50% respiratory variability, suggesting right atrial pressure of 3 mmHg. FINDINGS  Left Ventricle: Left ventricular ejection fraction, by visual estimation, is 20 to 25%. The left ventricle has normal function. Definity contrast agent was given IV to delineate the left ventricular endocardial borders. The left ventricle demonstrates regional wall motion abnormalities. The left ventricular internal cavity size was moderately dilated left ventricle. Normal left ventricular posterior wall thickness. There is no left ventricular hypertrophy. Left ventricular diastolic parameters are indeterminate. Normal left atrial pressure. Global hypokinesis. Akinesis of the mid-apical septum, mid-apical inferior, and apical myocardium. Right Ventricle: The right ventricular size is normal. No increase in right ventricular wall thickness. Global RV systolic function is has mildly reduced systolic function. Left Atrium: Left atrial size was normal in size. Right Atrium: Right atrial size was normal in size Pericardium: There is no evidence of pericardial effusion. Mitral Valve: The mitral valve is normal in  structure. Trivial mitral valve regurgitation. No evidence of mitral valve stenosis by  observation. Tricuspid Valve: The tricuspid valve is normal in structure. Tricuspid valve regurgitation is not demonstrated. Aortic Valve: The aortic valve is normal in structure. Aortic valve regurgitation is not visualized. The aortic valve is structurally normal, with no evidence of sclerosis or stenosis. Pulmonic Valve: The pulmonic valve was normal in structure. Pulmonic valve regurgitation is not visualized. Pulmonic regurgitation is not visualized. Aorta: The aortic root, ascending aorta and aortic arch are all structurally normal, with no evidence of dilitation or obstruction. Venous: The inferior vena cava is normal in size with greater than 50% respiratory variability, suggesting right atrial pressure of 3 mmHg. IAS/Shunts: No atrial level shunt detected by color flow Doppler. There is no evidence of a patent foramen ovale. No ventricular septal defect is seen or detected. There is no evidence of an atrial septal defect.  LEFT VENTRICLE PLAX 2D LVIDd:         6.21 cm LVIDs:         5.60 cm LV PW:         0.90 cm LV IVS:        0.90 cm LVOT diam:     2.10 cm LV SV:         41 ml LV SV Index:   18.54 LVOT Area:     3.46 cm  LV Volumes (MOD) LV area d, A4C:    50.60 cm LV area s, A4C:    44.30 cm LV major d, A4C:   9.73 cm LV major s, A4C:   9.19 cm LV vol d, MOD A4C: 207.0 ml LV vol s, MOD A4C: 175.0 ml LV SV MOD A4C:     207.0 ml RIGHT VENTRICLE TAPSE (M-mode): 1.1 cm LEFT ATRIUM             Index       RIGHT ATRIUM           Index LA diam:        4.50 cm 2.15 cm/m  RA Area:     15.80 cm LA Vol (A2C):   34.3 ml 16.37 ml/m RA Volume:   40.20 ml  19.19 ml/m LA Vol (A4C):   53.6 ml 25.59 ml/m LA Biplane Vol: 42.5 ml 20.29 ml/m  AORTIC VALVE LVOT Vmax:   79.20 cm/s LVOT Vmean:  56.800 cm/s LVOT VTI:    0.124 m  AORTA Ao Root diam: 3.80 cm  SHUNTS Systemic VTI:  0.12 m Systemic Diam: 2.10 cm  Skeet Latch MD Electronically signed by Skeet Latch MD Signature Date/Time: 01/20/2020/2:30:13 PM     Final         Scheduled Meds: . amLODipine  5 mg Oral Daily  . aspirin EC  81 mg Oral Daily  . carvedilol  6.25 mg Oral BID WC  . perflutren lipid microspheres (DEFINITY) IV suspension      . pravastatin  40 mg Oral q1800  . warfarin  5 mg Oral ONCE-1800  . Warfarin - Pharmacist Dosing Inpatient   Does not apply q1800   Continuous Infusions: . heparin       LOS: 1 day    Time spent: 25 minutes with over 50% of the time coordinating the patient's care    Harold Hedge, DO [facility] Pager [phone]  If 7PM-7AM, please contact night-coverage [email or web address] Password [email or web address] 01/20/2020, 4:50 PM

## 2020-01-20 NOTE — Progress Notes (Signed)
ANTICOAGULATION CONSULT NOTE - Follow Up Consult  Pharmacy Consult for heparin Indication: chest pain/ACS  Labs: Recent Labs    01/19/20 1754 01/19/20 1755 01/19/20 2000 01/19/20 2240 01/20/20 0233  HGB 14.4  --   --   --  14.2  HCT 44.6  --   --   --  43.4  PLT 136*  --   --   --  121*  LABPROT  --  19.8*  --   --   --   INR  --  1.7*  --   --   --   HEPARINUNFRC  --   --   --   --  0.57  CREATININE 2.20*  --   --  2.19* 2.25*  TROPONINIHS 916*  --  3,275* 5,845*  --     Assessment/Plan:  86yo male therapeutic on heparin with initial dosing for CP. Will continue gtt at current rate and confirm stable with additional level.   Wynona Neat, PharmD, BCPS  01/20/2020,3:53 AM

## 2020-01-20 NOTE — H&P (View-Only) (Signed)
Progress Note  Patient Name: [name] [name] Date of Encounter: 01/20/2020  Primary Cardiologist: Ida Rogue, MD   Subjective   Still having some mild CP, left arm pain. Less severe than with prior cath.   Inpatient Medications    Scheduled Meds: . amLODipine  5 mg Oral Daily  . aspirin EC  81 mg Oral Daily  . carvedilol  6.25 mg Oral BID WC  . pravastatin  40 mg Oral q1800   Continuous Infusions: . heparin 1,200 Units/hr (01/19/20 1941)  . nitroGLYCERIN 5 mcg/min (01/19/20 1956)   PRN Meds: acetaminophen **OR** acetaminophen, furosemide   Vital Signs    Vitals:   01/20/20 0315 01/20/20 0330 01/20/20 0336 01/20/20 0736  BP: 133/86 129/73 129/73 (!) 141/85  Pulse: 74 86 89 86  Resp: 17 19 18 18   Temp:   97.6 F (36.4 C) 97.8 F (36.6 C)  TempSrc:   Oral Oral  SpO2: 94% 98% 97% 95%  Weight:   101.2 kg   Height:        Intake/Output Summary (Last 24 hours) at 01/20/2020 4696 Last data filed at 01/20/2020 0800 Gross per 24 hour  Intake 653.79 ml  Output 500 ml  Net 153.79 ml   Last 3 Weights 01/20/2020 01/19/2020 01/14/2020  Weight (lbs) 223 lb 1.7 oz 223 lb 1.7 oz 233 lb  Weight (kg) 101.2 kg 101.2 kg 105.688 kg      Telemetry    AFIB LBBB 98 - Personally Reviewed  ECG    LBBB AFIB 99 - Personally Reviewed  Physical Exam   GEN: No acute distress.   Neck: No JVD Cardiac: RRR, no murmurs, rubs, or gallops.  Respiratory: Clear to auscultation bilaterally. GI: Soft, nontender, non-distended  MS: No edema; No deformity. Neuro:  Nonfocal  Psych: Normal affect   Labs    High Sensitivity Troponin:   Recent Labs  Lab 01/19/20 1754 01/19/20 2000 01/19/20 2240  TROPONINIHS 916* 3,275* 5,845*      Chemistry Recent Labs  Lab 01/19/20 1754 01/19/20 2240 01/20/20 0233  NA 138 138 139  K 6.1* 5.4* 5.3*  CL 111 108 107  CO2 22 21* 23  GLUCOSE 109* 136* 115*  BUN 35* 36* 36*  CREATININE 2.20* 2.19* 2.25*  CALCIUM 8.5* 8.9 8.8*  PROT 6.1*   --  5.9*  ALBUMIN 3.5  --  3.4*  AST 28  --  44*  ALT 28  --  27  ALKPHOS 41  --  39  BILITOT 0.8  --  0.6  GFRNONAA 26* 26* 25*  GFRAA 30* 30* 30*  ANIONGAP 5 9 9      Hematology Recent Labs  Lab 01/19/20 1754 01/20/20 0233  WBC 6.5 8.1  RBC 4.21* 4.16*  HGB 14.4 14.2  HCT 44.6 43.4  MCV 105.9* 104.3*  MCH 34.2* 34.1*  MCHC 32.3 32.7  RDW 13.1 13.2  PLT 136* 121*    BNPNo results for input(s): BNP, PROBNP in the last 168 hours.   DDimer No results for input(s): DDIMER in the last 168 hours.   Radiology    DG Chest Portable 1 View  Result Date: 01/19/2020 CLINICAL DATA:  LEFT side chest pain for 1 month, associated LEFT arm pain, increased with exertion and relieved with rest, began today way and sitting, history coronary artery disease post MI, hypertension EXAM: PORTABLE CHEST 1 VIEW COMPARISON:  Portable exam 1723 hours compared to 03/06/2019 FINDINGS: Minimal enlargement of cardiac silhouette. Mediastinal contours and pulmonary  vascularity normal. Lungs clear. No pulmonary infiltrate, pleural effusion or pneumothorax. Bones slightly demineralized. IMPRESSION: Minimal enlargement of cardiac silhouette. No acute abnormalities. Electronically Signed   By: Lavonia Dana M.D.   On: 01/19/2020 17:59    Cardiac Studies   ECHO P  Patient Profile     86 y.o. male here with non-ST elevation myocardial infarction, atrial fibrillation persistent, known CAD with multiple PCI's, CKD stage III  Assessment & Plan    Non-ST elevation myocardial infarction -Troponins increased from 900 up to 5000 with chest pain, left arm pain consistent with myocardial infarction. -Currently on heparin IV, aspirin, carvedilol 6.25 twice daily and pravastatin 40. -Nitroglycerin drip -IV hydration -Plan for cardiac catheterization. Checking INR stat this am. If increased from 1.7 hold off until tomorrow on cath.  Creatinine is ranged from 1.5-2.2, was 2.2 in July 2019.  Chronic kidney disease has  been present.  Renal protection with hydration. Risks and benefits explained including renal dysfunction, stroke, MI, death, bleeding. Willing to proceed.    Hyperlipidemia -On pravastatin 40-LDL 53  Chronic kidney disease stage stage III/IV -Fairly stable over the past 2 years.  Will be at increased risk for dye administration cardiac catheterization however benefits outweigh risk.  Continue to optimize with IV hydration.  Careful administration given his EF previously reduced, ranging from 35-50 according to prior records.  Persistent atrial fibrillation -Currently on IV heparin.  Did not wish to take Eliquis secondary to cost.  Did not previously want to take Coumadin because of hassle.  Rate is currently reasonably controlled.  Increased risk of embolic stroke without anticoagulation.  CHA2DS2-VASc score of at least 4 -Last week he started Coumadin at the request of Dr. Rockey Situ.  His INR on 01/19/2020 at 1755 was 1.7.  I will recheck this morning.    Left bundle branch block -Chronic, prior EKG from March reviewed, was in sinus rhythm at the time.  Ischemic cardiomyopathy -EF has been reduced as described above.  Watch with fluid hydration. -Stress test August 2016 showed fixed defect old anterior MI with no ischemia -EF in 2016 was 45 to 50% with apical hypokinesis -Cardiac catheterization October 2012 showed left main shows no disease, LAD has 30% proximal disease, 2 overlapping stents in the mid region, 95% ostial diagonal disease jailed by the stents, 95% proximal OM 2 disease, mild plaque in the RCA, hypokinesis of the anterolateral wall, apical region and inferoapical wall with ejection fraction 30-35%.      For questions or updates, please contact [place] Please consult [email or web address] for contact info under        Signed, Candee Furbish, MD  01/20/2020, 8:22 AM    Addendum-INR last evening 1.7, currently this morning 1.8, fairly stable.  I discussed with Anderson Malta in Cath  Lab as well as Dr. Irish Lack.  We will try to avoid groin access.  Radial access preferable to reduce bleeding risks.  Willing to proceed.  Candee Furbish, MD

## 2020-01-20 NOTE — Progress Notes (Addendum)
ANTICOAGULATION CONSULT NOTE - Follow Up Consult  Pharmacy Consult for heparin Indication: chest pain/ACS  Labs: Recent Labs    01/19/20 1754 01/19/20 1755 01/19/20 2000 01/19/20 2240 01/20/20 0233 01/20/20 0905  HGB 14.4  --   --   --  14.2  --   HCT 44.6  --   --   --  43.4  --   PLT 136*  --   --   --  121*  --   LABPROT  --  19.8*  --   --   --  20.7*  INR  --  1.7*  --   --   --  1.8*  HEPARINUNFRC  --   --   --   --  0.57 0.83*  CREATININE 2.20*  --   --  2.19* 2.25*  --   TROPONINIHS 916*  --  3,275* 5,845*  --   --     Assessment/Plan:  86yo male therapeutic on heparin with initial dosing for CP.   Follow up heparin level above goal at 0.83 on 1200 units/hr. INR 1.8. CBC wnl. No bleeding issues noted.   Plan: Decrease heparin to 1000 units/hr, plan for cath this afternoon Follow up plans post-cath  Erin Hearing PharmD., BCPS Clinical Pharmacist 01/20/2020 12:21 PM  Addendum post-cath Cath shows nonobstructive CAD, previous stents patent.   Orders to resume warfarin and heparin tonight. Patient taking warfarin 5mg  daily prior to admit. Heparin decreased to 1000 units/hr prior to cath will restart at this rate.   Will give 5mg  tonight and follow up INR in am to consider increasing dose.   01/20/2020 2:12 PM

## 2020-01-20 NOTE — Progress Notes (Signed)
Back from the cath lab by bed awake and alert. TR band to right wrist in placed, pulse ox to right thumb. Arm elevated with pillow. Instructed to avoid movement of the affected arm. Continue  to monitor.

## 2020-01-20 NOTE — Progress Notes (Signed)
  Echocardiogram 2D Echocardiogram has been performed.  [name] [name] 01/20/2020, 10:43 AM

## 2020-01-20 NOTE — Progress Notes (Signed)
TR Band removed, gauze2x2 and tegaderm applied,no bleeding noted.

## 2020-01-20 NOTE — Interval H&P Note (Signed)
Cath Lab Visit (complete for each Cath Lab visit)  Clinical Evaluation Leading to the Procedure:   ACS: Yes.    Non-ACS:    Anginal Classification: CCS IV  Anti-ischemic medical therapy: Minimal Therapy (1 class of medications)  Non-Invasive Test Results: No non-invasive testing performed  Prior CABG: No previous CABG      History and Physical Interval Note:  01/20/2020 1:09 PM  [name] [name]  has presented today for surgery, with the diagnosis of chest pain.  The various methods of treatment have been discussed with the patient and family. After consideration of risks, benefits and other options for treatment, the patient has consented to  Procedure(s): LEFT HEART CATH AND CORONARY ANGIOGRAPHY (N/A) as a surgical intervention.  The patient's history has been reviewed, patient examined, no change in status, stable for surgery.  I have reviewed the patient's chart and labs.  Questions were answered to the patient's satisfaction.     Larae Grooms

## 2020-01-20 NOTE — Progress Notes (Signed)
Transported to the cath lab by bed awake and alert. 

## 2020-01-21 LAB — CBC
HCT: 41.5 % (ref 39.0–52.0)
Hemoglobin: 13.6 g/dL (ref 13.0–17.0)
MCH: 34.2 pg — ABNORMAL HIGH (ref 26.0–34.0)
MCHC: 32.8 g/dL (ref 30.0–36.0)
MCV: 104.3 fL — ABNORMAL HIGH (ref 80.0–100.0)
Platelets: 98 10*3/uL — ABNORMAL LOW (ref 150–400)
RBC: 3.98 MIL/uL — ABNORMAL LOW (ref 4.22–5.81)
RDW: 13.4 % (ref 11.5–15.5)
WBC: 8.5 10*3/uL (ref 4.0–10.5)
nRBC: 0 % (ref 0.0–0.2)

## 2020-01-21 LAB — PROTIME-INR
INR: 1.9 — ABNORMAL HIGH (ref 0.8–1.2)
Prothrombin Time: 22 seconds — ABNORMAL HIGH (ref 11.4–15.2)

## 2020-01-21 LAB — BASIC METABOLIC PANEL
Anion gap: 10 (ref 5–15)
BUN: 32 mg/dL — ABNORMAL HIGH (ref 8–23)
CO2: 22 mmol/L (ref 22–32)
Calcium: 8.8 mg/dL — ABNORMAL LOW (ref 8.9–10.3)
Chloride: 105 mmol/L (ref 98–111)
Creatinine, Ser: 1.97 mg/dL — ABNORMAL HIGH (ref 0.61–1.24)
GFR calc Af Amer: 35 mL/min — ABNORMAL LOW (ref >60)
GFR calc non Af Amer: 30 mL/min — ABNORMAL LOW (ref >60)
Glucose, Bld: 114 mg/dL — ABNORMAL HIGH (ref 70–99)
Potassium: 5.5 mmol/L — ABNORMAL HIGH (ref 3.5–5.1)
Sodium: 137 mmol/L (ref 135–145)

## 2020-01-21 LAB — MAGNESIUM: Magnesium: 1.8 mg/dL (ref 1.7–2.4)

## 2020-01-21 LAB — HEPARIN LEVEL (UNFRACTIONATED): Heparin Unfractionated: 0.12 IU/mL — ABNORMAL LOW (ref 0.30–0.70)

## 2020-01-21 MED ORDER — SODIUM ZIRCONIUM CYCLOSILICATE 5 G PO PACK
5.0000 g | PACK | Freq: Every day | ORAL | Status: DC
  Filled 2020-01-21: qty 1

## 2020-01-21 MED ORDER — SODIUM ZIRCONIUM CYCLOSILICATE 5 G PO PACK
5.0000 g | PACK | Freq: Every day | ORAL | Status: AC
  Administered 2020-01-21: 5 g via ORAL
  Filled 2020-01-21: qty 1

## 2020-01-21 MED ORDER — WARFARIN SODIUM 5 MG PO TABS: 5.0000 mg | ORAL_TABLET | Freq: Once | ORAL | Status: DC

## 2020-01-21 MED FILL — Verapamil HCl IV Soln 2.5 MG/ML: INTRAVENOUS | Qty: 2 | Status: AC

## 2020-01-21 NOTE — Progress Notes (Signed)
ANTICOAGULATION CONSULT NOTE - Follow Up Consult  Pharmacy Consult for heparin Indication: atrial fibrillation  Labs: Recent Labs    01/19/20 1754 01/19/20 1755 01/19/20 2000 01/19/20 2240 01/20/20 0233 01/20/20 0905 01/21/20 0536  HGB 14.4  --   --   --  14.2  --   --   HCT 44.6  --   --   --  43.4  --   --   PLT 136*  --   --   --  121*  --   --   LABPROT  --  19.8*  --   --   --  20.7* 22.0*  INR  --  1.7*  --   --   --  1.8* 1.9*  HEPARINUNFRC  --   --   --   --  0.57 0.83* 0.12*  CREATININE 2.20*  --   --  2.19* 2.25*  --   --   TROPONINIHS 916*  --  3,275* 5,845*  --   --   --     Assessment: 86yo male subtherapeutic on heparin after resumed post-cath at lower rate given high level earlier; no gtt issues or signs of bleeding per RN.  Goal of Therapy:  Heparin level 0.3-0.7 units/ml   Plan:  Will increase heparin gtt slightly to 1100 units/hr and check level in 8 hours.    Wynona Neat, PharmD, BCPS  01/21/2020,6:24 AM

## 2020-01-21 NOTE — Discharge Summary (Signed)
Physician Discharge Summary  [name] [name] HKV:[id number] DOB: 12/01/1931 DOA: 01/19/2020  PCP: [name], MD  Admit date: 01/19/2020 Discharge date: 01/21/2020   Code Status: Full Code  Admitted From: home Discharged to:  home Home Health:no  Equipment/Devices:no  Discharge Condition:stable   Recommendations for Outpatient Follow-up   1. Follow up with cardiology in two weeks   Hospital Summary  This is an 86 year old male with history of hypertension, hyperlipidemia, CKD 3, paroxysmal atrial fibrillation, CAD status post stent who was admitted on 1/24 with complaints of chest pain and radiation to left arm with associated diaphoresis.  Improved with aspirin and nitroglycerin at home.  Initial troponin 916 was evaluated by cardiology and started on heparin drip.  Troponin increased from 916>> 5845, remained hemodynamically stable.  Echo with EF 20 to 25% reduced from previous 40 to 45%.  Patient went for cardiac catheterization on 1/25 which showed nonobstructive CAD. He remained stable overnight on restarted heparin drip with coumadin and INR at discharge was 1.9. Discharged in stable condition without any medication changes and plans for follow up in cardiology office in two weeks.   A & P   Principal Problem:   Chest pain Active Problems:   Hyperlipidemia   HYPERTENSION, BENIGN   NSTEMI (non-ST elevated myocardial infarction) (HCC)   CAD, NATIVE VESSEL   Chronic renal failure, stage 3 (moderate)   Hyperkalemia   Renal failure (ARF), acute on chronic (HCC)   Elevated troponin    1. NSTEMI status post cardiac catheterization 1/25 a. Troponin 916->>5000+ b. Cath 01/21/20: nonobstructive CAD c. Continue current meds at discharge with outpatient follow up 2. Persistent atrial fibrillation a. CHA2DS2-VASc: 4 b. Not on Eliquis due to cost c. Recently started coumadin 1 week ago, INR 1.9. Ok to discharge from cardio perspective  3. Chronic LBBB  4. Ischemic  cardiomyopathy a. EF in 2016 45 to 50% with apical hypokinesis, EF today: 20-25 % b. Continue Coreg c. Appreciate cardiology recommendations 5. Hyperkalemia a. Given calcium gluconate and sodium bicarb x1 and received Lokelma in ED b. No repeat labs, will follow up in a.m. 6. AKI on CKD 3, likely from NSTEMI and hemodynamic changes a. Creatinine 2.25-> 1.97, baseline 1.5  b. Follow-up outpatient  Family communication: Discussed with wife over the phone    Consultants  . Cardiology  Procedures  Cardiac catheterization 01/20/2020  Previously placed 2nd Mrg stent (unknown type) is widely patent.  Previously placed Mid LAD stent (unknown type) is widely patent.  Prox LAD to Mid LAD lesion is 25% stenosed.  2nd Diag lesion is 80% stenosed.  LV end diastolic pressure is normal.  There is no aortic valve stenosis.  Antibiotics   Anti-infectives (From admission, onward)   None        Subjective  Patient seen and examined at bedside no acute distress and resting comfortably.  No events overnight.  Tolerating diet. In good spirits and anticipating discharge.   Denies any chest pain, shortness of breath, fever, nausea, vomiting, urinary or bowel complaints. Otherwise ROS negative   Objective   Discharge Exam: Vitals:   01/21/20 0727 01/21/20 0922  BP:  133/72  Pulse:    Resp:    Temp: 98.6 F (37 C)   SpO2:     Vitals:   01/21/20 0320 01/21/20 0631 01/21/20 0727 01/21/20 0922  BP: 124/73 128/70  133/72  Pulse: 94 90    Resp: (!) 23     Temp: 98 F (36.7 C)  98.6 F (37 C)   TempSrc: Oral  Oral   SpO2: 95%     Weight: 101.2 kg     Height:        Physical Exam Vitals and nursing note reviewed.  Constitutional:      Appearance: Normal appearance.  HENT:     Head: Normocephalic and atraumatic.     Nose: Nose normal.     Mouth/Throat:     Mouth: Mucous membranes are moist.  Cardiovascular:     Rate and Rhythm: Normal rate. Rhythm irregular.   Pulmonary:     Effort: Pulmonary effort is normal.     Breath sounds: Normal breath sounds.  Abdominal:     General: Abdomen is flat.     Palpations: Abdomen is soft.  Musculoskeletal:        General: No swelling.     Cervical back: No rigidity.     Right lower leg: No tenderness. No edema.     Left lower leg: No tenderness. No edema.  Skin:    Coloration: Skin is not pale.  Neurological:     General: No focal deficit present.     Mental Status: He is alert. Mental status is at baseline.  Psychiatric:        Mood and Affect: Mood normal.        Behavior: Behavior normal.       The results of significant diagnostics from this hospitalization (including imaging, microbiology, ancillary and laboratory) are listed below for reference.     Microbiology: Recent Results (from the past 240 hour(s))  Respiratory Panel by RT PCR (Flu A&B, Covid) - Nasopharyngeal Swab     Status: None   Collection Time: 01/19/20  5:44 PM   Specimen: Nasopharyngeal Swab  Result Value Ref Range Status   SARS Coronavirus 2 by RT PCR NEGATIVE NEGATIVE Final    Comment: (NOTE) SARS-CoV-2 target nucleic acids are NOT DETECTED. The SARS-CoV-2 RNA is generally detectable in upper respiratoy specimens during the acute phase of infection. The lowest concentration of SARS-CoV-2 viral copies this assay can detect is 131 copies/mL. A negative result does not preclude SARS-Cov-2 infection and should not be used as the sole basis for treatment or other patient management decisions. A negative result may occur with  improper specimen collection/handling, submission of specimen other than nasopharyngeal swab, presence of viral mutation(s) within the areas targeted by this assay, and inadequate number of viral copies (<131 copies/mL). A negative result must be combined with clinical observations, patient history, and epidemiological information. The expected result is Negative. Fact Sheet for Patients:   PinkCheek.be Fact Sheet for Healthcare Providers:  GravelBags.it This test is not yet ap proved or cleared by the [place] FDA and  has been authorized for detection and/or diagnosis of SARS-CoV-2 by FDA under an Emergency Use Authorization (EUA). This EUA will remain  in effect (meaning this test can be used) for the duration of the COVID-19 declaration under Section 564(b)(1) of the Act, 21 U.S.C. section 360bbb-3(b)(1), unless the authorization is terminated or revoked sooner.    Influenza A by PCR NEGATIVE NEGATIVE Final   Influenza B by PCR NEGATIVE NEGATIVE Final    Comment: (NOTE) The Xpert Xpress SARS-CoV-2/FLU/RSV assay is intended as an aid in  the diagnosis of influenza from Nasopharyngeal swab specimens and  should not be used as a sole basis for treatment. Nasal washings and  aspirates are unacceptable for Xpert Xpress SARS-CoV-2/FLU/RSV  testing. Fact Sheet for Patients: PinkCheek.be Fact  Sheet for Healthcare Providers: GravelBags.it This test is not yet approved or cleared by the [place] and  has been authorized for detection and/or diagnosis of SARS-CoV-2 by  FDA under an Emergency Use Authorization (EUA). This EUA will remain  in effect (meaning this test can be used) for the duration of the  Covid-19 declaration under Section 564(b)(1) of the Act, 21  U.S.C. section 360bbb-3(b)(1), unless the authorization is  terminated or revoked. Performed at [facility], [place] [address], [place], [place] [address]   MRSA PCR Screening     Status: None   Collection Time: 01/19/20  9:35 PM   Specimen: Nasopharyngeal  Result Value Ref Range Status   MRSA by PCR NEGATIVE NEGATIVE Final    Comment:        The GeneXpert MRSA Assay (FDA approved for NASAL specimens only), is one component of a comprehensive MRSA colonization surveillance  program. It is not intended to diagnose MRSA infection nor to guide or monitor treatment for MRSA infections. Performed at [facility], [place] [address] [place], [place] [address]      Labs: BNP (last 3 results) Recent Labs    03/05/19 0940  BNP 846.9*   Basic Metabolic Panel: Recent Labs  Lab 01/19/20 1754 01/19/20 2240 01/20/20 0233 01/21/20 0536  NA 138 138 139 137  K 6.1* 5.4* 5.3* 5.5*  CL 111 108 107 105  CO2 22 21* 23 22  GLUCOSE 109* 136* 115* 114*  BUN 35* 36* 36* 32*  CREATININE 2.20* 2.19* 2.25* 1.97*  CALCIUM 8.5* 8.9 8.8* 8.8*  MG  --   --   --  1.8   Liver Function Tests: Recent Labs  Lab 01/19/20 1754 01/20/20 0233  AST 28 44*  ALT 28 27  ALKPHOS 41 39  BILITOT 0.8 0.6  PROT 6.1* 5.9*  ALBUMIN 3.5 3.4*   No results for input(s): LIPASE, AMYLASE in the last 168 hours. No results for input(s): AMMONIA in the last 168 hours. CBC: Recent Labs  Lab 01/19/20 1754 01/20/20 0233 01/21/20 0536  WBC 6.5 8.1 8.5  NEUTROABS 4.1  --   --   HGB 14.4 14.2 13.6  HCT 44.6 43.4 41.5  MCV 105.9* 104.3* 104.3*  PLT 136* 121* 98*   Cardiac Enzymes: No results for input(s): CKTOTAL, CKMB, CKMBINDEX, TROPONINI in the last 168 hours. BNP: Invalid input(s): POCBNP CBG: No results for input(s): GLUCAP in the last 168 hours. D-Dimer No results for input(s): DDIMER in the last 72 hours. Hgb A1c Recent Labs    01/20/20 0233  HGBA1C 6.0*   Lipid Profile Recent Labs    01/20/20 0233  CHOL 110  HDL 26*  LDLCALC 53  TRIG 153*  CHOLHDL 4.2   Thyroid function studies No results for input(s): TSH, T4TOTAL, T3FREE, THYROIDAB in the last 72 hours.  Invalid input(s): FREET3 Anemia work up No results for input(s): VITAMINB12, FOLATE, FERRITIN, TIBC, IRON, RETICCTPCT in the last 72 hours. Urinalysis    Component Value Date/Time   COLORURINE YELLOW 06/20/2018 1032   APPEARANCEUR CLEAR 06/20/2018 1032   LABSPEC 1.015 06/20/2018 1032   PHURINE  5.0 06/20/2018 1032   GLUCOSEU NEGATIVE 06/20/2018 1032   HGBUR SMALL (A) 06/20/2018 1032   BILIRUBINUR NEGATIVE 06/20/2018 1032   KETONESUR NEGATIVE 06/20/2018 1032   PROTEINUR NEGATIVE 06/20/2018 1032   UROBILINOGEN 0.2 08/03/2015 0925   NITRITE NEGATIVE 06/20/2018 1032   LEUKOCYTESUR NEGATIVE 06/20/2018 1032   Sepsis Labs Invalid input(s):  PROCALCITONIN,  WBC,  LACTICIDVEN Microbiology Recent Results (from the past 240 hour(s))  Respiratory Panel by RT PCR (Flu A&B, Covid) - Nasopharyngeal Swab     Status: None   Collection Time: 01/19/20  5:44 PM   Specimen: Nasopharyngeal Swab  Result Value Ref Range Status   SARS Coronavirus 2 by RT PCR NEGATIVE NEGATIVE Final    Comment: (NOTE) SARS-CoV-2 target nucleic acids are NOT DETECTED. The SARS-CoV-2 RNA is generally detectable in upper respiratoy specimens during the acute phase of infection. The lowest concentration of SARS-CoV-2 viral copies this assay can detect is 131 copies/mL. A negative result does not preclude SARS-Cov-2 infection and should not be used as the sole basis for treatment or other patient management decisions. A negative result may occur with  improper specimen collection/handling, submission of specimen other than nasopharyngeal swab, presence of viral mutation(s) within the areas targeted by this assay, and inadequate number of viral copies (<131 copies/mL). A negative result must be combined with clinical observations, patient history, and epidemiological information. The expected result is Negative. Fact Sheet for Patients:  PinkCheek.be Fact Sheet for Healthcare Providers:  GravelBags.it This test is not yet ap proved or cleared by the [place] FDA and  has been authorized for detection and/or diagnosis of SARS-CoV-2 by FDA under an Emergency Use Authorization (EUA). This EUA will remain  in effect (meaning this test can be used) for the  duration of the COVID-19 declaration under Section 564(b)(1) of the Act, 21 U.S.C. section 360bbb-3(b)(1), unless the authorization is terminated or revoked sooner.    Influenza A by PCR NEGATIVE NEGATIVE Final   Influenza B by PCR NEGATIVE NEGATIVE Final    Comment: (NOTE) The Xpert Xpress SARS-CoV-2/FLU/RSV assay is intended as an aid in  the diagnosis of influenza from Nasopharyngeal swab specimens and  should not be used as a sole basis for treatment. Nasal washings and  aspirates are unacceptable for Xpert Xpress SARS-CoV-2/FLU/RSV  testing. Fact Sheet for Patients: PinkCheek.be Fact Sheet for Healthcare Providers: GravelBags.it This test is not yet approved or cleared by the [place] FDA and  has been authorized for detection and/or diagnosis of SARS-CoV-2 by  FDA under an Emergency Use Authorization (EUA). This EUA will remain  in effect (meaning this test can be used) for the duration of the  Covid-19 declaration under Section 564(b)(1) of the Act, 21  U.S.C. section 360bbb-3(b)(1), unless the authorization is  terminated or revoked. Performed at [facility], [place] [address] [place], [place] [address]   MRSA PCR Screening     Status: None   Collection Time: 01/19/20  9:35 PM   Specimen: Nasopharyngeal  Result Value Ref Range Status   MRSA by PCR NEGATIVE NEGATIVE Final    Comment:        The GeneXpert MRSA Assay (FDA approved for NASAL specimens only), is one component of a comprehensive MRSA colonization surveillance program. It is not intended to diagnose MRSA infection nor to guide or monitor treatment for MRSA infections. Performed at [facility], [place] [address], [place], [place] [address]     Discharge Instructions     Discharge Instructions    Diet - low sodium heart healthy   Complete by: As directed    Discharge instructions   Complete by: As directed    You were seen and  examined in the hospital for chest pain and cared for by a hospitalist and cardiologist. Fortunately, your cardiac catheterization did not show any  concerning findings  Upon Discharge:  Continue taking your home medications as prescribed Follow up with your cardiologist in the next two weeks   Request that your primary physician go over all hospital tests and procedures/radiological results at the follow up.   Please get all hospital records sent to your physician by signing a hospital release before you go home.     Read the complete instructions along with all the possible side effects for all the medicines you take and that have been prescribed to you. Take any new medicines after you have completely understood and accept all the possible adverse reactions/side effects.   If you have any questions about your discharge medications or the care you received while you were in the hospital, you can call the unit and asked to speak with the hospitalist on call. Once you are discharged, your primary care physician will handle any further medical issues. Please note that NO REFILLS for any discharge medications will be authorized, as it is imperative that you return to your primary care physician (or establish a relationship with a primary care physician if you do not have one) for your aftercare needs so that they can reassess your need for medications and monitor your lab values.   Do not drive, operate heavy machinery, perform activities at heights, swimming or participation in water activities or provide baby sitting services if your were admitted for loss of consciousness/seizures or if you are on sedating medications including, but not limited to benzodiazepines, sleep medications, narcotic pain medications, etc., until you have been cleared to do so by a medical doctor.   Do not take more than prescribed medications.   Wear a seat belt while driving.  If you have smoked or chewed Tobacco  in the last 2 years please stop smoking; also stop any regular Alcohol and/or any Recreational drug use including marijuana.  If you experience worsening of your admission symptoms or develop shortness of breath, chest pain, suicidal or homicidal thoughts or experience a life threatening emergency, you must seek medical attention immediately by calling 911 or calling your PCP immediately.   Increase activity slowly   Complete by: As directed      Allergies as of 01/21/2020      Reactions   Amoxicillin Rash   Has patient had a PCN reaction causing immediate rash, facial/tongue/throat swelling, SOB or lightheadedness with hypotension: Unknown Has patient had a PCN reaction causing severe rash involving mucus membranes or skin necrosis: No Has patient had a PCN reaction that required hospitalization: No Has patient had a PCN reaction occurring within the last 10 years: Yes If all of the above answers are "NO", then may proceed with Cephalosporin use.      Medication List    TAKE these medications   amLODipine 5 MG tablet Commonly known as: NORVASC Take 5 mg by mouth daily.   aspirin EC 81 MG tablet Take 1 tablet (81 mg total) by mouth daily.   azelastine 0.1 % nasal spray Commonly known as: ASTELIN Place 1 spray into the nose 2 (two) times daily.   carvedilol 3.125 MG tablet Commonly known as: COREG Take 2 tablets (6.25 mg total) by mouth 2 (two) times daily with a meal.   furosemide 40 MG tablet Commonly known as: LASIX Take 1 tablet (40 mg total) by mouth daily as needed (for fluid retention.).   losartan 100 MG tablet Commonly known as: COZAAR Take 100 mg by mouth daily.   lovastatin 40 MG  tablet Commonly known as: MEVACOR Take 40 mg by mouth at bedtime.   nitroGLYCERIN 0.4 MG SL tablet Commonly known as: NITROSTAT Place 0.4 mg under the tongue every 5 (five) minutes as needed for chest pain.   oxyCODONE-acetaminophen 5-325 MG tablet Commonly known as:  PERCOCET/ROXICET Take 1 tablet by mouth every 4 (four) hours as needed for severe pain.   tiZANidine 2 MG tablet Commonly known as: ZANAFLEX Take 1 tablet (2 mg total) by mouth every 6 (six) hours as needed.   warfarin 5 MG tablet Commonly known as: COUMADIN Take 1 tablet (5 mg total) by mouth daily.       Allergies  Allergen Reactions  . Amoxicillin Rash    Has patient had a PCN reaction causing immediate rash, facial/tongue/throat swelling, SOB or lightheadedness with hypotension: Unknown Has patient had a PCN reaction causing severe rash involving mucus membranes or skin necrosis: No Has patient had a PCN reaction that required hospitalization: No Has patient had a PCN reaction occurring within the last 10 years: Yes If all of the above answers are "NO", then may proceed with Cephalosporin use.     Time coordinating discharge: Over 30 minutes   SIGNED:   Harold Hedge, D.O. [facility] Pager: [phone]  01/21/2020, 9:40 AM

## 2020-01-21 NOTE — Progress Notes (Signed)
Progress Note  Patient Name: [name] [name] Date of Encounter: 01/21/2020  Primary Cardiologist: Ida Rogue, MD   Subjective   Post catheterization.  No occlusive vessels.  No PCI performed.  Peak troponin 5800.  Feels well this morning.  No chest pain.  Atrial fibrillation heart rate less than 100.  Inpatient Medications    Scheduled Meds:  amLODipine  5 mg Oral Daily   aspirin EC  81 mg Oral Daily   carvedilol  6.25 mg Oral BID WC   pravastatin  40 mg Oral q1800   warfarin  5 mg Oral ONCE-1800   Warfarin - Pharmacist Dosing Inpatient   Does not apply q1800   Continuous Infusions:  heparin 1,100 Units/hr (01/21/20 0631)   PRN Meds: acetaminophen **OR** acetaminophen, furosemide   Vital Signs    Vitals:   01/20/20 2310 01/21/20 0320 01/21/20 0631 01/21/20 0727  BP: 127/67 124/73 128/70   Pulse: 88 94 90   Resp: (!) 21 (!) 23    Temp: 98.3 F (36.8 C) 98 F (36.7 C)  98.6 F (37 C)  TempSrc: Oral Oral  Oral  SpO2: 94% 95%    Weight:  101.2 kg    Height:        Intake/Output Summary (Last 24 hours) at 01/21/2020 0837 Last data filed at 01/21/2020 0631 Gross per 24 hour  Intake 624.52 ml  Output 1275 ml  Net -650.48 ml   Last 3 Weights 01/21/2020 01/20/2020 01/19/2020  Weight (lbs) 223 lb 1.7 oz 223 lb 1.7 oz 223 lb 1.7 oz  Weight (kg) 101.2 kg 101.2 kg 101.2 kg      Telemetry    Atrial fibrillation- Personally Reviewed  ECG    Atrial fibrillation- Personally Reviewed  Physical Exam   GEN: No acute distress.   Neck: No JVD Cardiac: IRRR, no murmurs, rubs, or gallops.  Respiratory: Clear to auscultation bilaterally. GI: Soft, nontender, non-distended  MS: No edema; No deformity.  Cath site normal Neuro:  Nonfocal  Psych: Normal affect   Labs    High Sensitivity Troponin:   Recent Labs  Lab 01/19/20 1754 01/19/20 2000 01/19/20 2240  TROPONINIHS 916* 3,275* 5,845*      Chemistry Recent Labs  Lab 01/19/20 1754 01/19/20 1754  01/19/20 2240 01/20/20 0233 01/21/20 0536  NA 138   < > 138 139 137  K 6.1*   < > 5.4* 5.3* 5.5*  CL 111   < > 108 107 105  CO2 22   < > 21* 23 22  GLUCOSE 109*   < > 136* 115* 114*  BUN 35*   < > 36* 36* 32*  CREATININE 2.20*   < > 2.19* 2.25* 1.97*  CALCIUM 8.5*   < > 8.9 8.8* 8.8*  PROT 6.1*  --   --  5.9*  --   ALBUMIN 3.5  --   --  3.4*  --   AST 28  --   --  44*  --   ALT 28  --   --  27  --   ALKPHOS 41  --   --  39  --   BILITOT 0.8  --   --  0.6  --   GFRNONAA 26*   < > 26* 25* 30*  GFRAA 30*   < > 30* 30* 35*  ANIONGAP 5   < > 9 9 10    < > = values in this interval not displayed.     Hematology Recent  Labs  Lab 01/19/20 1754 01/20/20 0233 01/21/20 0536  WBC 6.5 8.1 8.5  RBC 4.21* 4.16* 3.98*  HGB 14.4 14.2 13.6  HCT 44.6 43.4 41.5  MCV 105.9* 104.3* 104.3*  MCH 34.2* 34.1* 34.2*  MCHC 32.3 32.7 32.8  RDW 13.1 13.2 13.4  PLT 136* 121* 98*    BNPNo results for input(s): BNP, PROBNP in the last 168 hours.   DDimer No results for input(s): DDIMER in the last 168 hours.   Radiology    CARDIAC CATHETERIZATION  Result Date: 01/20/2020  Previously placed 2nd Mrg stent (unknown type) is widely patent.  Previously placed Mid LAD stent (unknown type) is widely patent.  Prox LAD to Mid LAD lesion is 25% stenosed.  2nd Diag lesion is 80% stenosed.  LV end diastolic pressure is normal.  There is no aortic valve stenosis.  Nonobstructive CAD. Continue medical therapy.  OK to restart warfarin tonight.  Resume IV heparin tonight as well 8 hours post sheath pull. INR in AM.   DG Chest Portable 1 View  Result Date: 01/19/2020 CLINICAL DATA:  LEFT side chest pain for 1 month, associated LEFT arm pain, increased with exertion and relieved with rest, began today way and sitting, history coronary artery disease post MI, hypertension EXAM: PORTABLE CHEST 1 VIEW COMPARISON:  Portable exam 1723 hours compared to 03/06/2019 FINDINGS: Minimal enlargement of cardiac  silhouette. Mediastinal contours and pulmonary vascularity normal. Lungs clear. No pulmonary infiltrate, pleural effusion or pneumothorax. Bones slightly demineralized. IMPRESSION: Minimal enlargement of cardiac silhouette. No acute abnormalities. Electronically Signed   By: Lavonia Dana M.D.   On: 01/19/2020 17:59   ECHOCARDIOGRAM COMPLETE  Result Date: 01/20/2020   ECHOCARDIOGRAM REPORT   Patient Name:   [name] Date of Exam: 01/20/2020 Medical Rec #:  [id number]       Height:       66.0 in Accession #:    [id number]      Weight:       223.1 lb Date of Birth:  [birth year]       BSA:          2.09 m Patient Age:    86 years        BP:           141/85 mmHg Patient Gender: M               HR:           72 bpm. Exam Location:  Inpatient Procedure: 2D Echo, Cardiac Doppler, Color Doppler and Intracardiac            Opacification Agent Indications:    Chest Pain 786.05  History:        Patient has prior history of Echocardiogram examinations, most                 recent 08/04/2015. CAD, Arrythmias:non-specific ST changes,                 Signs/Symptoms:Chest Pain and Shortness of Breath; Risk                 Factors:Hypertension, Dyslipidemia and Former Smoker. GERD.  Sonographer:    Paulita Fujita RDCS Referring Phys: [id number] JARED E SEGAL  Sonographer Comments: Technically difficult study due to poor echo windows. Image acquisition challenging due to respiratory motion. IMPRESSIONS  1. Left ventricular ejection fraction, by visual estimation, is 20 to 25%. The left ventricle has normal function. Left ventricular septal wall thickness was normal.  Normal left ventricular posterior wall thickness. There is no left ventricular hypertrophy.  2. Definity contrast agent was given IV to delineate the left ventricular endocardial borders.  3. Left ventricular diastolic parameters are indeterminate.  4. Moderately dilated left ventricular internal cavity size.  5. The left ventricle demonstrates regional wall motion  abnormalities.  6. Global hypokinesis. Akinesis of the mid-apical septum, mid-apical inferior, and apical myocardium.  7. Global right ventricle has mildly reduced systolic function.The right ventricular size is normal. No increase in right ventricular wall thickness.  8. Left atrial size was normal.  9. Right atrial size was normal. 10. The mitral valve is normal in structure. Trivial mitral valve regurgitation. No evidence of mitral stenosis. 11. The tricuspid valve is normal in structure. 12. The tricuspid valve is normal in structure. Tricuspid valve regurgitation is not demonstrated. 13. The aortic valve is normal in structure. Aortic valve regurgitation is not visualized. No evidence of aortic valve sclerosis or stenosis. 14. The pulmonic valve was normal in structure. Pulmonic valve regurgitation is not visualized. 15. The inferior vena cava is normal in size with greater than 50% respiratory variability, suggesting right atrial pressure of 3 mmHg. FINDINGS  Left Ventricle: Left ventricular ejection fraction, by visual estimation, is 20 to 25%. The left ventricle has normal function. Definity contrast agent was given IV to delineate the left ventricular endocardial borders. The left ventricle demonstrates regional wall motion abnormalities. The left ventricular internal cavity size was moderately dilated left ventricle. Normal left ventricular posterior wall thickness. There is no left ventricular hypertrophy. Left ventricular diastolic parameters are indeterminate. Normal left atrial pressure. Global hypokinesis. Akinesis of the mid-apical septum, mid-apical inferior, and apical myocardium. Right Ventricle: The right ventricular size is normal. No increase in right ventricular wall thickness. Global RV systolic function is has mildly reduced systolic function. Left Atrium: Left atrial size was normal in size. Right Atrium: Right atrial size was normal in size Pericardium: There is no evidence of pericardial  effusion. Mitral Valve: The mitral valve is normal in structure. Trivial mitral valve regurgitation. No evidence of mitral valve stenosis by observation. Tricuspid Valve: The tricuspid valve is normal in structure. Tricuspid valve regurgitation is not demonstrated. Aortic Valve: The aortic valve is normal in structure. Aortic valve regurgitation is not visualized. The aortic valve is structurally normal, with no evidence of sclerosis or stenosis. Pulmonic Valve: The pulmonic valve was normal in structure. Pulmonic valve regurgitation is not visualized. Pulmonic regurgitation is not visualized. Aorta: The aortic root, ascending aorta and aortic arch are all structurally normal, with no evidence of dilitation or obstruction. Venous: The inferior vena cava is normal in size with greater than 50% respiratory variability, suggesting right atrial pressure of 3 mmHg. IAS/Shunts: No atrial level shunt detected by color flow Doppler. There is no evidence of a patent foramen ovale. No ventricular septal defect is seen or detected. There is no evidence of an atrial septal defect.  LEFT VENTRICLE PLAX 2D LVIDd:         6.21 cm LVIDs:         5.60 cm LV PW:         0.90 cm LV IVS:        0.90 cm LVOT diam:     2.10 cm LV SV:         41 ml LV SV Index:   18.54 LVOT Area:     3.46 cm  LV Volumes (MOD) LV area d, A4C:    50.60 cm  LV area s, A4C:    44.30 cm LV major d, A4C:   9.73 cm LV major s, A4C:   9.19 cm LV vol d, MOD A4C: 207.0 ml LV vol s, MOD A4C: 175.0 ml LV SV MOD A4C:     207.0 ml RIGHT VENTRICLE TAPSE (M-mode): 1.1 cm LEFT ATRIUM             Index       RIGHT ATRIUM           Index LA diam:        4.50 cm 2.15 cm/m  RA Area:     15.80 cm LA Vol (A2C):   34.3 ml 16.37 ml/m RA Volume:   40.20 ml  19.19 ml/m LA Vol (A4C):   53.6 ml 25.59 ml/m LA Biplane Vol: 42.5 ml 20.29 ml/m  AORTIC VALVE LVOT Vmax:   79.20 cm/s LVOT Vmean:  56.800 cm/s LVOT VTI:    0.124 m  AORTA Ao Root diam: 3.80 cm  SHUNTS Systemic VTI:   0.12 m Systemic Diam: 2.10 cm  Skeet Latch MD Electronically signed by Skeet Latch MD Signature Date/Time: 01/20/2020/2:30:13 PM    Final     Cardiac Studies    Cardiac catheterization 01/20/2020  Previously placed 2nd Mrg stent (unknown type) is widely patent.  Previously placed Mid LAD stent (unknown type) is widely patent.  Prox LAD to Mid LAD lesion is 25% stenosed.  2nd Diag lesion is 80% stenosed.  LV end diastolic pressure is normal.  There is no aortic valve stenosis.   Nonobstructive CAD. Continue medical therapy.  OK to restart warfarin tonight.  Resume IV heparin tonight as well 8 hours post sheath pull. INR in AM.    Patient Profile     86 y.o. male with non-ST elevation myocardial infarction, atrial fibrillation, known CAD  Assessment & Plan    Non-ST elevation myocardial infarction -Interestingly, no occluded vessel noted on catheterization after close inspection.  Discussed with Dr. Irish Lack and personally reviewed the films. -Continue with aggressive medical management.   Atrial fibrillation persistent-CHA2DS2-VASc score 4 -Restarted Coumadin which was just recently started last week by Dr. Rockey Situ.  Last INR 1.9.  He did not wish to take Eliquis due to cost -Okay for discharge from my perspective  Chronic kidney disease stage IV -Creatinine slightly improved this morning 1.97 from 2.25  Hyperlipidemia -Excellent LDL 53.  Goal-directed therapy.  Okay for discharge from my perspective.  No need for Lovenox bridge given his INR of 1.9.  We do not want to increase his bleeding risk.  We will have follow-up with Dr. Rockey Situ or APP in the next 2 weeks.      For questions or updates, please contact [facility] Please consult [email or web address] for contact info under        Signed, Candee Furbish, MD  01/21/2020, 8:37 AM

## 2020-01-21 NOTE — Progress Notes (Signed)
Discharged home by wheelchair accompanied by wife. Belongings taken home.

## 2020-01-21 NOTE — Progress Notes (Signed)
ANTICOAGULATION CONSULT NOTE - Follow Up Consult  Pharmacy Consult for heparin Indication: chest pain/ACS/AFib  Labs: Recent Labs    01/19/20 1754 01/19/20 1754 01/19/20 1755 01/19/20 2000 01/19/20 2240 01/20/20 0233 01/20/20 0905 01/21/20 0536  HGB 14.4  --   --   --   --  14.2  --   --   HCT 44.6  --   --   --   --  43.4  --   --   PLT 136*  --   --   --   --  121*  --   --   LABPROT  --   --  19.8*  --   --   --  20.7* 22.0*  INR  --   --  1.7*  --   --   --  1.8* 1.9*  HEPARINUNFRC  --   --   --   --   --  0.57 0.83* 0.12*  CREATININE 2.20*   < >  --   --  2.19* 2.25*  --  1.97*  TROPONINIHS 916*  --   --  3,275* 5,845*  --   --   --    < > = values in this interval not displayed.    Assessment/Plan:  66 yoM recently started on warfarin PTA for AF admitted with CP and NSTEMI. Pt resumed on heparin after cath while INR subtherapeutic.  INR 1.9, heparin level low and adjusted, CBC stable.  *Home warfarin dose = 5mg  daily  Goal: INR 2-3 Heparin Level 0.3-0.7  Plan: -Heparin to 1100 units/h - checking heparin level this afternoon -Warfarin 5mg  PO x1 again tonight -Daily INR, CBC, heparin level   Arrie Senate, PharmD, BCPS Clinical Pharmacist [phone] Please check AMION for all [facility] numbers 01/21/2020

## 2020-01-21 NOTE — Progress Notes (Signed)
All set for discharge home, discharge instructions given to pt. Awaiting ride home.

## 2020-01-22 ENCOUNTER — Other Ambulatory Visit: Payer: Self-pay

## 2020-01-22 ENCOUNTER — Telehealth: Payer: Self-pay

## 2020-01-22 ENCOUNTER — Ambulatory Visit (INDEPENDENT_AMBULATORY_CARE_PROVIDER_SITE_OTHER): Payer: PPO

## 2020-01-22 DIAGNOSIS — I4891 Unspecified atrial fibrillation: Secondary | ICD-10-CM

## 2020-01-22 DIAGNOSIS — Z5181 Encounter for therapeutic drug level monitoring: Secondary | ICD-10-CM | POA: Diagnosis not present

## 2020-01-22 LAB — POCT INR: INR: 2.4 (ref 2.0–3.0)

## 2020-01-22 NOTE — Telephone Encounter (Signed)
Left detailed message on home vm--ok per DPR--informing patient of date and time of appt with Ignacia Bayley, NP at [facility] office.

## 2020-01-22 NOTE — Patient Instructions (Signed)
Continue warfarin dosage of 5 mg every day.  Recheck in 1 week.

## 2020-01-28 ENCOUNTER — Ambulatory Visit: Payer: PPO | Admitting: Cardiovascular Disease

## 2020-01-29 ENCOUNTER — Other Ambulatory Visit: Payer: Self-pay

## 2020-01-29 ENCOUNTER — Ambulatory Visit (INDEPENDENT_AMBULATORY_CARE_PROVIDER_SITE_OTHER): Payer: PPO

## 2020-01-29 ENCOUNTER — Encounter: Payer: Self-pay | Admitting: Nurse Practitioner

## 2020-01-29 ENCOUNTER — Ambulatory Visit (INDEPENDENT_AMBULATORY_CARE_PROVIDER_SITE_OTHER): Payer: PPO | Admitting: Nurse Practitioner

## 2020-01-29 VITALS — BP 110/70 | HR 73 | Ht 66.0 in | Wt 222.5 lb

## 2020-01-29 DIAGNOSIS — I5022 Chronic systolic (congestive) heart failure: Secondary | ICD-10-CM

## 2020-01-29 DIAGNOSIS — Z5181 Encounter for therapeutic drug level monitoring: Secondary | ICD-10-CM

## 2020-01-29 DIAGNOSIS — I2510 Atherosclerotic heart disease of native coronary artery without angina pectoris: Secondary | ICD-10-CM | POA: Diagnosis not present

## 2020-01-29 DIAGNOSIS — I214 Non-ST elevation (NSTEMI) myocardial infarction: Secondary | ICD-10-CM

## 2020-01-29 DIAGNOSIS — I4819 Other persistent atrial fibrillation: Secondary | ICD-10-CM

## 2020-01-29 DIAGNOSIS — I5023 Acute on chronic systolic (congestive) heart failure: Secondary | ICD-10-CM

## 2020-01-29 DIAGNOSIS — I4891 Unspecified atrial fibrillation: Secondary | ICD-10-CM

## 2020-01-29 DIAGNOSIS — I255 Ischemic cardiomyopathy: Secondary | ICD-10-CM

## 2020-01-29 DIAGNOSIS — N183 Chronic kidney disease, stage 3 unspecified: Secondary | ICD-10-CM | POA: Diagnosis not present

## 2020-01-29 LAB — POCT INR: INR: 2.9 (ref 2.0–3.0)

## 2020-01-29 MED ORDER — FUROSEMIDE 40 MG PO TABS: 40.0000 mg | ORAL_TABLET | Freq: Every day | ORAL | 6 refills | Status: DC

## 2020-01-29 NOTE — Patient Instructions (Signed)
Since your INR is <3.0, STOP the warfarin and per Dr. Ramonita Lab, START XARELTO 15 mg tonight.  It was nice to meet you!  I hope you stay safe and stay warm!

## 2020-01-29 NOTE — Progress Notes (Addendum)
Office Visit    Patient Name: [name] [name] Date of Encounter: 01/29/2020  Primary Care Provider:  [name], MD Primary Cardiologist:  Ida Rogue, MD  Chief Complaint    86 year old male with a history of CAD status post prior LAD and second obtuse marginal stenting, hypertension, hyperlipidemia, ischemic cardiomyopathy with an EF of 20-25%, HFrEF, recently diagnosed atrial fibrillation, left bundle branch block, and stage III chronic kidney disease, who presents for follow-up after recent admission and catheterization.  Past Medical History    Past Medical History:  Diagnosis Date  . Arthritis   . Asymptomatic Sinus bradycardia   . CAD (coronary artery disease)    a. 10/2005 NSTEMI/PCI: LAD 95 (2.75x33 Cypher DES); b. 2008 Ant STEMI/PCI LAD 2/2 ISR; c. 09/2011 PCI OM2; d. 02/2019 MV: Fixed mid-dist ant/apical, mid-apical inferolateral dfects w/o ischemia; e. 12/2019 Cath: LM nl, LAD 25p/m, patent stent, D2 80, LCX nl, OM2 patent stent, RCA min irregs.  . CKD (chronic kidney disease), stage III   . HFrEF (heart failure with reduced ejection fraction) ([place])    a. 12/2019 Echo: EF 20-25%, glob HK. Mid-apical septum, inf, apical AK. Mildly reduced RV fxn. Triv MR.  Marland Kitchen Hyperlipidemia   . Hypertension   . Ischemic cardiomyopathy    a. 07/2015 Echo: EF 45-50%; b. 12/2019 Echo: EF 20-25%.  Marland Kitchen LBBB (left bundle branch block)   . Myocardial infarction ([place])   . Persistent atrial fibrillation ([place])    a.  Diagnosed January 2021.  CHA2DS2VASc = 5--> warfarin.  . Tobacco user    Remote   Past Surgical History:  Procedure Laterality Date  . CARDIAC CATHETERIZATION  10/21/2011   s/p stent placement  . CARDIAC CATHETERIZATION  2008   stent placement  . CARDIAC CATHETERIZATION  2006   stent placement  . CARPAL TUNNEL RELEASE Right 07/22/2016   Procedure: CARPAL TUNNEL RELEASE;  Surgeon: Leanor Kail, MD;  Location: [facility];  Service: Orthopedics;  Laterality: Right;  .  JOINT REPLACEMENT    . LEFT HEART CATH AND CORONARY ANGIOGRAPHY N/A 01/20/2020   Procedure: LEFT HEART CATH AND CORONARY ANGIOGRAPHY;  Surgeon: Jettie Booze, MD;  Location: [place] [facility];  Service: Cardiovascular;  Laterality: N/A;  . TOTAL HIP ARTHROPLASTY     Right  . TOTAL KNEE ARTHROPLASTY Left 06/29/2018   Procedure: LEFT TOTAL KNEE ARTHROPLASTY;  Surgeon: Frederik Pear, MD;  Location: [place];  Service: Orthopedics;  Laterality: Left;  . TRANSURETHRAL RESECTION OF PROSTATE      Allergies  Allergies  Allergen Reactions  . Amoxicillin Rash    Has patient had a PCN reaction causing immediate rash, facial/tongue/throat swelling, SOB or lightheadedness with hypotension: Unknown Has patient had a PCN reaction causing severe rash involving mucus membranes or skin necrosis: No Has patient had a PCN reaction that required hospitalization: No Has patient had a PCN reaction occurring within the last 10 years: Yes If all of the above answers are "NO", then may proceed with Cephalosporin use.     History of Present Illness    86 year old male with the above complex past medical history including coronary artery disease, hypertension, hyperlipidemia, ischemic cardiomyopathy, HFrEF, recently diagnosed atrial fibrillation, left bundle branch block, and stage III chronic kidney disease.  November 2006, he suffered a non-STEMI and required drug-eluting stent placement to his LAD.  He had a relook catheterization 5 days later which showed patency of the stent.  Unfortunately, in December 2008, he suffered an  anterior STEMI in the setting of in-stent restenosis within the LAD and required repeat intervention.  In October 2012, he required PCI of the OM 2.  He had a nonischemic stress test in March 2020 and was last seen in clinic on January 19 of this year, at which time he was feeling well.  It was at that time, that he was noted to be in atrial fibrillation of unknown duration and he was placed  on warfarin.  Unfortunately, he was admitted to [facility] on January 24 after presenting with left-sided chest pressure radiating to his arm, associated with diaphoresis.  High-sensitivity troponin was elevated at 916.  Following admission, this rose to 5800.  Warfarin was held and he was placed on heparin and subsequently underwent diagnostic catheterization revealing patent LAD and obtuse marginal stents with small vessel diagonal disease.  Echocardiogram during admission showed an EF of 20-25%.  Warfarin was resumed post catheterization, though his PCP just changed this to xarelto, which he will start tonight.   Since hospital discharge, he has continued to note dyspnea on exertion.  In retrospect, he says that he has been more dyspneic over the past several months.  Since discharge, he has required as needed Lasix about 3 times or as typically, he would only take this a few times a month.  He has noted some mild lower extremity swelling and increasing abdominal girth though his weight is down 1 pound since discharge.  He has not been weighing himself regularly at home.  He is careful with his salt intake.  He denies chest pain, palpitations, PND, orthopnea, dizziness, syncope, or early satiety.  Home Medications    Prior to Admission medications   Medication Sig Start Date End Date Taking? Authorizing Provider  amLODipine (NORVASC) 5 MG tablet Take 5 mg by mouth daily. 03/05/19 03/04/20  [provider]  aspirin EC 81 MG tablet Take 1 tablet (81 mg total) by mouth daily. 03/15/19   Minna Merritts, MD  azelastine (ASTELIN) 0.1 % nasal spray Place 1 spray into the nose 2 (two) times daily. 08/30/18 01/14/20  [provider]  carvedilol (COREG) 3.125 MG tablet Take 2 tablets (6.25 mg total) by mouth 2 (two) times daily with a meal. 01/14/20   Gollan, Kathlene November, MD  furosemide (LASIX) 40 MG tablet Take 1 tablet (40 mg total) by mouth daily as needed (for fluid retention.). 03/15/19    Minna Merritts, MD  losartan (COZAAR) 100 MG tablet Take 100 mg by mouth daily. 11/09/18   [provider]  lovastatin (MEVACOR) 40 MG tablet Take 40 mg by mouth at bedtime.    [provider]  nitroGLYCERIN (NITROSTAT) 0.4 MG SL tablet Place 0.4 mg under the tongue every 5 (five) minutes as needed for chest pain.    [provider]  oxyCODONE-acetaminophen (PERCOCET/ROXICET) 5-325 MG tablet Take 1 tablet by mouth every 4 (four) hours as needed for severe pain. Patient not taking: Reported on 01/19/2020 06/29/18   Leighton Parody, PA-C  tiZANidine (ZANAFLEX) 2 MG tablet Take 1 tablet (2 mg total) by mouth every 6 (six) hours as needed. Patient not taking: Reported on 01/19/2020 06/29/18   Leighton Parody, PA-C  warfarin (COUMADIN) 5 MG tablet Take 1 tablet (5 mg total) by mouth daily. 01/14/20   Minna Merritts, MD    Review of Systems    Ongoing DOE, inc abd girth, and lower ext swelling as outlined above.  He denies chest pain, palpitations,  pnd, orthopnea, n, v, dizziness, syncope, weight gain, or early satiety.  All other systems reviewed and are otherwise negative except as noted above.  Physical Exam    VS:  BP 110/70 (BP Location: Left Arm, Patient Position: Sitting, Cuff Size: Normal)   Pulse 73   Ht 5\' 6"  (1.676 m)   Wt 222 lb 8 oz (100.9 kg)   SpO2 97%   BMI 35.91 kg/m  , BMI Body mass index is 35.91 kg/m. GEN: Well nourished, well developed, in no acute distress. HEENT: normal. Neck: Supple, no JVD, carotid bruits, or masses. Cardiac: IR, IR, no murmurs, rubs, or gallops. No clubbing, cyanosis, 1+ bilateral ankle edema.  Radials/PT 2+ and equal bilaterally. R radial cath site w/o bleeding/bruit/hematoma. Respiratory:  Respirations regular and unlabored, clear to auscultation bilaterally. GI: Semifirm, nontender, BS + x 4. MS: no deformity or atrophy. Skin: warm and dry, no rash. Neuro:  Strength and sensation are intact. Psych: Normal  affect.  Accessory Clinical Findings    ECG personally reviewed by me today -atrial fibrillation, 73, left axis deviation, left bundle branch block- no acute changes.  Lab Results  Component Value Date   WBC 8.5 01/21/2020   HGB 13.6 01/21/2020   HCT 41.5 01/21/2020   MCV 104.3 (H) 01/21/2020   PLT 98 (L) 01/21/2020   Lab Results  Component Value Date   CREATININE 1.97 (H) 01/21/2020   BUN 32 (H) 01/21/2020   NA 137 01/21/2020   K 5.5 (H) 01/21/2020   CL 105 01/21/2020   CO2 22 01/21/2020   Lab Results  Component Value Date   ALT 27 01/20/2020   AST 44 (H) 01/20/2020   ALKPHOS 39 01/20/2020   BILITOT 0.6 01/20/2020   Lab Results  Component Value Date   CHOL 110 01/20/2020   HDL 26 (L) 01/20/2020   LDLCALC 53 01/20/2020   TRIG 153 (H) 01/20/2020   CHOLHDL 4.2 01/20/2020    Lab Results  Component Value Date   HGBA1C 6.0 (H) 01/20/2020    Assessment & Plan    1.  Acute on chronic systolic congestive heart failure/ischemic cardiomyopathy: Over the past several months, patient has been experiencing progressive dyspnea on exertion and occasional lower extremity swelling with increase in abdominal girth.  He was noted to be in atrial fibrillation at his office visit in January but at the time, volume was stable and he was not reporting any significant symptoms.  Unfortunately, he was hospitalized on January 24 with chest pain and non-STEMI.  Echo during that admission showed worsening LV dysfunction with an EF of 20-25%, down from 45 to 50% previously.  Diagnostic catheterization revealed patent LAD and OM 2 stents with a normal LVEDP of 12 mmHg.  Since hospital discharge, he has continued to note dyspnea on exertion with minimal activity.  He is also experiencing more frequent lower extremity swelling and has taken as needed Lasix 40 mg at least 3 times since discharge.  His abdomen is semifirm today and he does have 1+ lower extremity edema.  I do not appreciate significant  JVD however, I did place a ReDS Vest, and he was slightly elevated at 39%.  I am checking a basic metabolic panel today and have advised that she take Lasix 40 mg daily with plan for follow-up basic metabolic panel in 1 week.  Discussed potentially switching him from losartan to [facility] however, as I am adjusting diuretics, I will hold off on this.  I did provide him with  the name of Delene Loll so that he could check with his insurance company to see what it might cost.  Continue carvedilol.  Question role of atrial fibrillation and loss of atrial kick in worsening heart failure, especially in light of no significant findings on catheterization.  We will plan on possible cardioversion in 4 weeks.  2.  Non-STEMI, subsequent episode of care/CAD: Recent admission for chest pain and high-sensitivity troponin of 5800.  Catheterization without any significant findings (patent LAD and OM 2 stents).  He has not had any recurrent chest pain.  He remains on aspirin, beta-blocker, and statin therapy.  3.  Persistent atrial fibrillation: Unknown duration.  He started having progressive dyspnea 3 or more months ago.  In the setting of worsening LV dysfunction, we should pursue an initial rhythm control strategy with cardioversion after completion of 4 weeks of oral anticoagulation.  Atria were normal in size on recent echocardiogram, potentially suggesting a short duration of A. fib up to this point.  He is well rate controlled on beta-blocker therapy.  He has been on Coumadin with a therapeutic INR today.  He is switching over to Xarelto this evening (15mg  in setting of CKD).  4.  Essential hypertension: Stable on beta-blocker and ARB therapy.  5.  Hyperlipidemia: LDL of 53 in January.  Continue lovastatin therapy.  6.  Stage III chronic kidney disease: Creatinine 1.97 with potassium of 5.5 in January 26-discharge.  Follow-up today with plan to repeat again in 1 week in the setting of daily Lasix therapy.  7.   Disposition: Follow-up basic metabolic panel today.  Follow-up basic metabolic panel in 1 week.  Follow-up in clinic in 3 to 4 weeks.  Murray Hodgkins, NP 01/29/2020, 12:16 PM

## 2020-01-29 NOTE — Patient Instructions (Addendum)
Medication Instructions:  Your physician has recommended you make the following change in your medication:   Take Lasix 40 mg daily. An Rx has been sent to your pharmacy.    *If you need a refill on your cardiac medications before your next appointment, please call your pharmacy*  Lab Work: Your physician recommends that you return for lab work in: 1 week. Please have your lab drawn at the medical mall. You do not need an appointment. Their hours are Mon-Fri 7am-6pm.  Bmet today  Reds Vest today.  If you have labs (blood work) drawn today and your tests are completely normal, you will receive your results only by: Marland Kitchen MyChart Message (if you have MyChart) OR . A paper copy in the mail If you have any lab test that is abnormal or we need to change your treatment, we will call you to review the results.  Testing/Procedures: None ordered  Follow-Up: At [facility], you and your health needs are our priority.  As part of our continuing mission to provide you with exceptional heart care, we have created designated Provider Care Teams.  These Care Teams include your primary Cardiologist (physician) and Advanced Practice Providers (APPs -  Physician Assistants and Nurse Practitioners) who all work together to provide you with the care you need, when you need it.  Your next appointment:   4 week(s)  The format for your next appointment:   In Person  Provider:    You may see Ida Rogue, MD or one of the following Advanced Practice Providers on your designated Care Team:    Murray Hodgkins, NP   Other Instructions N/A

## 2020-01-30 ENCOUNTER — Telehealth: Payer: Self-pay

## 2020-01-30 ENCOUNTER — Ambulatory Visit: Payer: PPO | Attending: Internal Medicine

## 2020-01-30 DIAGNOSIS — Z23 Encounter for immunization: Secondary | ICD-10-CM

## 2020-01-30 LAB — BASIC METABOLIC PANEL
BUN/Creatinine Ratio: 17 (ref 10–24)
BUN: 40 mg/dL — ABNORMAL HIGH (ref 8–27)
CO2: 22 mmol/L (ref 20–29)
Calcium: 8.9 mg/dL (ref 8.6–10.2)
Chloride: 105 mmol/L (ref 96–106)
Creatinine, Ser: 2.41 mg/dL — ABNORMAL HIGH (ref 0.76–1.27)
GFR calc Af Amer: 27 mL/min/{1.73_m2} — ABNORMAL LOW (ref >59)
GFR calc non Af Amer: 23 mL/min/{1.73_m2} — ABNORMAL LOW (ref >59)
Glucose: 105 mg/dL — ABNORMAL HIGH (ref 65–99)
Potassium: 5.3 mmol/L — ABNORMAL HIGH (ref 3.5–5.2)
Sodium: 140 mmol/L (ref 134–144)

## 2020-01-30 MED ORDER — TORSEMIDE 20 MG PO TABS: 20.0000 mg | ORAL_TABLET | Freq: Every day | ORAL | 3 refills | Status: DC

## 2020-01-30 NOTE — Telephone Encounter (Signed)
-----   Message from Theora Gianotti, NP sent at 01/30/2020  7:52 AM EST ----- Kidney fxn slightly worse than at hospital discharge.  Potassium mildly elevated but stable.  At visit yesterday, I recommended that he take lasix 40mg  daily.  In light of lab findings, lets change to torsemide 20mg  daily instead.  F/u bmet next week as planned.

## 2020-01-30 NOTE — Progress Notes (Signed)
   Covid-19 Vaccination Clinic  Name:  [name] [name]    MRN: [id number] DOB: 28-Feb-1931  01/30/2020  [name] [name] was observed post Covid-19 immunization for 15 minutes without incidence. He was provided with Vaccine Information Sheet and instruction to access the V-Safe system.   [name] [name] was instructed to call 911 with any severe reactions post vaccine: Marland Kitchen Difficulty breathing  . Swelling of your face and throat  . A fast heartbeat  . A bad rash all over your body  . Dizziness and weakness    Immunizations Administered    Name Date Dose VIS Date Route   Pfizer COVID-19 Vaccine 01/30/2020  3:27 PM 0.3 mL 12/06/2019 Intramuscular   Manufacturer: [place]   Lot: [id number]   [place]: [id number]

## 2020-01-30 NOTE — Telephone Encounter (Signed)
Call to patient to review lab results and POC from Ignacia Bayley, NP.   Rx updated and Torsemide sent to pt preferred pharmacy.  Conformed order for BMET next Wednesday.   Pt verbalized understanding and had no further questions at this time.   Advised pt to call for any further questions or concerns.

## 2020-02-05 ENCOUNTER — Other Ambulatory Visit: Payer: Self-pay

## 2020-02-05 ENCOUNTER — Telehealth: Payer: Self-pay

## 2020-02-05 ENCOUNTER — Other Ambulatory Visit
Admission: RE | Admit: 2020-02-05 | Discharge: 2020-02-05 | Disposition: A | Discharge status: Home / Self Care | Payer: PPO | Attending: Nurse Practitioner | Admitting: Nurse Practitioner

## 2020-02-05 DIAGNOSIS — I214 Non-ST elevation (NSTEMI) myocardial infarction: Secondary | ICD-10-CM

## 2020-02-05 DIAGNOSIS — I4891 Unspecified atrial fibrillation: Secondary | ICD-10-CM

## 2020-02-05 DIAGNOSIS — I5023 Acute on chronic systolic (congestive) heart failure: Secondary | ICD-10-CM | POA: Insufficient documentation

## 2020-02-05 LAB — BASIC METABOLIC PANEL
Anion gap: 9 (ref 5–15)
BUN: 83 mg/dL — ABNORMAL HIGH (ref 8–23)
CO2: 26 mmol/L (ref 22–32)
Calcium: 8.8 mg/dL — ABNORMAL LOW (ref 8.9–10.3)
Chloride: 104 mmol/L (ref 98–111)
Creatinine, Ser: 3.1 mg/dL — ABNORMAL HIGH (ref 0.61–1.24)
GFR calc Af Amer: 20 mL/min — ABNORMAL LOW (ref >60)
GFR calc non Af Amer: 17 mL/min — ABNORMAL LOW (ref >60)
Glucose, Bld: 111 mg/dL — ABNORMAL HIGH (ref 70–99)
Potassium: 5.4 mmol/L — ABNORMAL HIGH (ref 3.5–5.1)
Sodium: 139 mmol/L (ref 135–145)

## 2020-02-05 NOTE — Telephone Encounter (Signed)
-----   Message from Theora Gianotti, NP sent at 02/05/2020 10:08 AM EST ----- Kidneys are dry. Potassium mildly  elevated. Hold torsemide. Pls  check  to make sure he isn't also taking lasix. Increase oral fluids. F/u  bmet on Friday (STAT  @ [facility])  to ensure that numbers improving.

## 2020-02-05 NOTE — Telephone Encounter (Signed)
Call to patient to discuss lab results and POC.   Pt agreeable to hold torsemide and repeat labs on Friday in the medical mall. Order placed.   Pt confirmed that he has not been taking lasix.   Advised pt to call for any further questions or concerns.

## 2020-02-07 ENCOUNTER — Other Ambulatory Visit
Admission: RE | Admit: 2020-02-07 | Discharge: 2020-02-07 | Disposition: A | Discharge status: Home / Self Care | Payer: PPO | Source: Ambulatory Visit | Attending: Nurse Practitioner | Admitting: Nurse Practitioner

## 2020-02-07 ENCOUNTER — Telehealth: Payer: Self-pay

## 2020-02-07 DIAGNOSIS — I4891 Unspecified atrial fibrillation: Secondary | ICD-10-CM

## 2020-02-07 LAB — BASIC METABOLIC PANEL
Anion gap: 7 (ref 5–15)
BUN: 75 mg/dL — ABNORMAL HIGH (ref 8–23)
CO2: 27 mmol/L (ref 22–32)
Calcium: 9 mg/dL (ref 8.9–10.3)
Chloride: 106 mmol/L (ref 98–111)
Creatinine, Ser: 2.65 mg/dL — ABNORMAL HIGH (ref 0.61–1.24)
GFR calc Af Amer: 24 mL/min — ABNORMAL LOW (ref >60)
GFR calc non Af Amer: 21 mL/min — ABNORMAL LOW (ref >60)
Glucose, Bld: 152 mg/dL — ABNORMAL HIGH (ref 70–99)
Potassium: 5.2 mmol/L — ABNORMAL HIGH (ref 3.5–5.1)
Sodium: 140 mmol/L (ref 135–145)

## 2020-02-07 NOTE — Telephone Encounter (Signed)
-----   Message from Rise Mu, PA-C sent at 02/07/2020 10:20 AM EST ----- Kidney number are improving, though remain above his baseline. Potassium remains mildly elevated, though is also improving. He should continue to hold torsemide. Ensure he is not adding any salt substitutes/seasonings that are high in potassium. Please order a BMET under [name] [name] for Monday, 02/10/2020 to trend.

## 2020-02-07 NOTE — Telephone Encounter (Signed)
Call attempted. No answer, no vm.   Call returned from patient. Reviewed lab results and POC. Pt verbalized understanding and will report to medical mall Monday for BMET. He will continue holding torsemide and is avaoiding K CL.   Advised pt to call for any further questions or concerns.

## 2020-02-10 ENCOUNTER — Other Ambulatory Visit: Payer: Self-pay

## 2020-02-10 ENCOUNTER — Other Ambulatory Visit
Admission: RE | Admit: 2020-02-10 | Discharge: 2020-02-10 | Disposition: A | Discharge status: Home / Self Care | Payer: PPO | Source: Ambulatory Visit | Attending: Nurse Practitioner | Admitting: Nurse Practitioner

## 2020-02-10 ENCOUNTER — Telehealth: Payer: Self-pay

## 2020-02-10 DIAGNOSIS — I4891 Unspecified atrial fibrillation: Secondary | ICD-10-CM | POA: Diagnosis not present

## 2020-02-10 LAB — BASIC METABOLIC PANEL
Anion gap: 8 (ref 5–15)
BUN: 76 mg/dL — ABNORMAL HIGH (ref 8–23)
CO2: 23 mmol/L (ref 22–32)
Calcium: 8.7 mg/dL — ABNORMAL LOW (ref 8.9–10.3)
Chloride: 106 mmol/L (ref 98–111)
Creatinine, Ser: 2.68 mg/dL — ABNORMAL HIGH (ref 0.61–1.24)
GFR calc Af Amer: 24 mL/min — ABNORMAL LOW (ref >60)
GFR calc non Af Amer: 21 mL/min — ABNORMAL LOW (ref >60)
Glucose, Bld: 129 mg/dL — ABNORMAL HIGH (ref 70–99)
Potassium: 5.3 mmol/L — ABNORMAL HIGH (ref 3.5–5.1)
Sodium: 137 mmol/L (ref 135–145)

## 2020-02-10 NOTE — Telephone Encounter (Signed)
-----   Message from Theora Gianotti, NP sent at 02/10/2020  8:12 AM EST ----- Potassium remains mildly elevated but stable.  Kidney function remains above baseline but also stable.  It will not likely be feasible to continue holding his diuretic, so I'd like for him to be seen in clinic this week to reassess volume status and determine diuretic dosing.

## 2020-02-10 NOTE — Telephone Encounter (Signed)
Call to patient to discuss lab results. He verbalized understanding and appt was made for this wed with Ignacia Bayley, NP.   Pt also reports nose bleed last night (on xarelto). He was able to get it stopped with packing and ice. Pt unable to quantify amount of bleeding.No known vitals at this time. I asked that he speak to Ignacia Bayley about it on wed. In the meantime if he has greater than 2-3 nose bleeds in a day or has sx of weakness, dizziness or low BP he should seek urgent medical care.   Advised pt to call for any further questions or concerns.

## 2020-02-12 ENCOUNTER — Ambulatory Visit (INDEPENDENT_AMBULATORY_CARE_PROVIDER_SITE_OTHER): Payer: PPO | Admitting: Nurse Practitioner

## 2020-02-12 ENCOUNTER — Encounter: Payer: Self-pay | Admitting: Nurse Practitioner

## 2020-02-12 ENCOUNTER — Other Ambulatory Visit: Payer: Self-pay

## 2020-02-12 VITALS — BP 110/60 | HR 77 | Ht 66.0 in | Wt 220.5 lb

## 2020-02-12 DIAGNOSIS — E875 Hyperkalemia: Secondary | ICD-10-CM | POA: Diagnosis not present

## 2020-02-12 DIAGNOSIS — I10 Essential (primary) hypertension: Secondary | ICD-10-CM

## 2020-02-12 DIAGNOSIS — I5020 Unspecified systolic (congestive) heart failure: Secondary | ICD-10-CM

## 2020-02-12 DIAGNOSIS — I4819 Other persistent atrial fibrillation: Secondary | ICD-10-CM

## 2020-02-12 DIAGNOSIS — N183 Chronic kidney disease, stage 3 unspecified: Secondary | ICD-10-CM | POA: Diagnosis not present

## 2020-02-12 DIAGNOSIS — E785 Hyperlipidemia, unspecified: Secondary | ICD-10-CM | POA: Diagnosis not present

## 2020-02-12 DIAGNOSIS — I2510 Atherosclerotic heart disease of native coronary artery without angina pectoris: Secondary | ICD-10-CM | POA: Diagnosis not present

## 2020-02-12 MED ORDER — TORSEMIDE 20 MG PO TABS: 20.0000 mg | ORAL_TABLET | Freq: Every day | ORAL | 3 refills | Status: DC | PRN

## 2020-02-12 MED ORDER — CARVEDILOL 6.25 MG PO TABS: 6.2500 mg | ORAL_TABLET | Freq: Two times a day (BID) | ORAL | 3 refills | Status: DC

## 2020-02-12 NOTE — Progress Notes (Signed)
Office Visit    Patient Name: [name] [name] Date of Encounter: 02/12/2020  Primary Care Provider:  [name], MD Primary Cardiologist:  Ida Rogue, MD  Chief Complaint    86 year old male with a history of CAD status post prior LAD and second obtuse marginal stenting, hypertension, hyperlipidemia, ischemic cardiomyopathy with an EF of 20-25%, HFrEF, recently diagnosed atrial fibrillation, left bundle branch block, and stage III chronic kidney disease, who presents for follow-up of CHF.  Past Medical History    Past Medical History:  Diagnosis Date  . Arthritis   . Asymptomatic Sinus bradycardia   . CAD (coronary artery disease)    a. 10/2005 NSTEMI/PCI: LAD 95 (2.75x33 Cypher DES); b. 2008 Ant STEMI/PCI LAD 2/2 ISR; c. 09/2011 PCI OM2; d. 02/2019 MV: Fixed mid-dist ant/apical, mid-apical inferolateral dfects w/o ischemia; e. 12/2019 Cath: LM nl, LAD 25p/m, patent stent, D2 80, LCX nl, OM2 patent stent, RCA min irregs.  . CKD (chronic kidney disease), stage III   . HFrEF (heart failure with reduced ejection fraction) ([place])    a. 12/2019 Echo: EF 20-25%, glob HK. Mid-apical septum, inf, apical AK. Mildly reduced RV fxn. Triv MR.  Marland Kitchen Hyperlipidemia   . Hypertension   . Ischemic cardiomyopathy    a. 07/2015 Echo: EF 45-50%; b. 12/2019 Echo: EF 20-25%.  Marland Kitchen LBBB (left bundle branch block)   . Myocardial infarction ([place])   . Persistent atrial fibrillation ([place])    a.  Diagnosed January 2021.  CHA2DS2VASc = 5--> warfarin.  . Tobacco user    Remote   Past Surgical History:  Procedure Laterality Date  . CARDIAC CATHETERIZATION  10/21/2011   s/p stent placement  . CARDIAC CATHETERIZATION  2008   stent placement  . CARDIAC CATHETERIZATION  2006   stent placement  . CARPAL TUNNEL RELEASE Right 07/22/2016   Procedure: CARPAL TUNNEL RELEASE;  Surgeon: Leanor Kail, MD;  Location: [facility];  Service: Orthopedics;  Laterality: Right;  . JOINT REPLACEMENT    . LEFT HEART CATH  AND CORONARY ANGIOGRAPHY N/A 01/20/2020   Procedure: LEFT HEART CATH AND CORONARY ANGIOGRAPHY;  Surgeon: Jettie Booze, MD;  Location: [place];  Service: Cardiovascular;  Laterality: N/A;  . TOTAL HIP ARTHROPLASTY     Right  . TOTAL KNEE ARTHROPLASTY Left 06/29/2018   Procedure: LEFT TOTAL KNEE ARTHROPLASTY;  Surgeon: Frederik Pear, MD;  Location: [facility];  Service: Orthopedics;  Laterality: Left;  . TRANSURETHRAL RESECTION OF PROSTATE      Allergies  Allergies  Allergen Reactions  . Amoxicillin Rash    Has patient had a PCN reaction causing immediate rash, facial/tongue/throat swelling, SOB or lightheadedness with hypotension: Unknown Has patient had a PCN reaction causing severe rash involving mucus membranes or skin necrosis: No Has patient had a PCN reaction that required hospitalization: No Has patient had a PCN reaction occurring within the last 10 years: Yes If all of the above answers are "NO", then may proceed with Cephalosporin use.     History of Present Illness    86 year old male with above complex past medical history including CAD, hypertension, hyperlipidemia, ischemic cardiomyopathy, HFrEF, recently diagnosed atrial fibrillation, left bundle branch block, and stage III chronic kidney disease.  In November 2006, he suffered a non-STEMI and required drug-eluting stent placement to his LAD.  He had a relook catheterization 5 days later, which showed patency of the stent.  Unfortunately, in December 2008, he suffered an anterior STEMI in the setting  of in-stent restenosis within the LAD, and required repeat intervention.  In October 2012, he required PCI of the OM 2.  He had a nonischemic stress test in March 2020 and was diagnosed with atrial fibrillation of unknown duration on January 19 of 2021.  He was placed on warfarin at that time.  Unfortunately, he was admitted to [facility] on January 19, 2020, after presenting with left-sided chest pressure radiating to his  arm and associated with diaphoresis.  High-sensitivity troponin was elevated at 916 and subsequently peaked at approximately 5800.  Warfarin was held and he was placed on heparin and underwent diagnostic catheterization revealing patent LAD and obtuse marginal stents with small vessel diagonal disease.  Echocardiogram during admission showed an EF of 20-25%.  Warfarin was resumed post catheterization but was then changed to Xarelto by his primary care provider as an outpatient.  I last saw him in clinic on February 3, at which time he reported taking Lasix 3 times since discharge, which was an increase in overall usage as he was accustomed only taking it a few times a month previously.  He also reported mild lower extremity swelling and increasing abdominal girth.  ReDS Vest was mildly elevated at 39%.  I had initially advised that he take Lasix 40 mg daily with plan for follow-up labs however, creatinine was elevated at 2.41, which was up from 1.97 on January 26.  I asked him to switch from Lasix to torsemide 20 mg daily however, this resulted in further rise in creatinine to 3.10 on February 10 with mild hyperkalemia (5.4).  Torsemide was placed on hold and BUN/creatinine on February 15 was 76/2.68 with a potassium of 5.3.  He is not on any potassium supplementation but is on losartan therapy.  He has been trying to watch his Na and K intake closely .  He has not been weighing himself daily.  Though he has been off of torsemide for a week, he has not felt that he has needed it.  He has some degree of chronic mild lower extremity swelling in the setting of venous stasis.  His dyspnea on exertion has been stable, occurring somewhere between 25 and 50 yards.  He says this has been this way for several months.  He has not had any chest pain denies palpitations, PND, orthopnea, dizziness, syncope, or early satiety.  He recently had a nosebleed and has stopped taking aspirin without any recurrence.  Home Medications     Prior to Admission medications   Medication Sig Start Date End Date Taking? Authorizing Provider  aspirin EC 81 MG tablet Take 1 tablet (81 mg total) by mouth daily. 03/15/19   Minna Merritts, MD  carvedilol (COREG) 3.125 MG tablet Take 2 tablets (6.25 mg total) by mouth 2 (two) times daily with a meal. 01/14/20   Gollan, Kathlene November, MD  losartan (COZAAR) 100 MG tablet Take 100 mg by mouth daily. 11/09/18   [provider]  lovastatin (MEVACOR) 40 MG tablet Take 40 mg by mouth at bedtime.    [provider]  nitroGLYCERIN (NITROSTAT) 0.4 MG SL tablet Place 0.4 mg under the tongue every 5 (five) minutes as needed for chest pain.    [provider]  Rivaroxaban (XARELTO) 15 MG TABS tablet Take 15 mg by mouth daily with supper.    [provider]  tiZANidine (ZANAFLEX) 2 MG tablet Take 1 tablet (2 mg total) by mouth every 6 (six) hours as needed. Patient not taking: Reported  on 01/19/2020 06/29/18   Leighton Parody, PA-C  torsemide (DEMADEX) 20 MG tablet Take 1 tablet (20 mg total) by mouth daily. 01/30/20   Theora Gianotti, NP    Review of Systems    Chronic dyspnea on exertion after walking somewhere between 25 and 50 yards.  This is stable times several months.  Chronic, mild lower extremity swelling which is unchanged.  He denies chest pain, palpitations, PND, orthopnea, dizziness, syncope, or early satiety.  He has noted occasional coughing spells after drinking water-he is not interested in speech therapy referral at this time.  Recent nosebleed which has not recurred since stopping aspirin.  All other systems reviewed and are otherwise negative except as noted above.  Physical Exam    VS:  BP 110/60 (BP Location: Left Arm, Patient Position: Sitting, Cuff Size: Normal)   Pulse 77   Ht 5\' 6"  (1.676 m)   Wt 220 lb 8 oz (100 kg)   SpO2 98%   BMI 35.59 kg/m  , BMI Body mass index is 35.59 kg/m.  ReDS Vest reading-37% GEN: Well nourished, well  developed, in no acute distress. HEENT: normal. Neck: Supple, no JVD, carotid bruits, or masses. Cardiac: RRR, no murmurs, rubs, or gallops. No clubbing, cyanosis, 1+ bilateral ankle edema.  Radials/PT 2+ and equal bilaterally.  Respiratory:  Respirations regular and unlabored, clear to auscultation bilaterally. GI: Soft, nontender, nondistended, BS + x 4. MS: no deformity or atrophy. Skin: warm and dry, no rash. Neuro:  Strength and sensation are intact. Psych: Normal affect.  Accessory Clinical Findings    ECG personally reviewed by me today -atrial fibrillation, 77, left axis deviation, left bundle branch block - no acute changes.  Lab Results  Component Value Date   WBC 8.5 01/21/2020   HGB 13.6 01/21/2020   HCT 41.5 01/21/2020   MCV 104.3 (H) 01/21/2020   PLT 98 (L) 01/21/2020   Lab Results  Component Value Date   CREATININE 2.68 (H) 02/10/2020   BUN 76 (H) 02/10/2020   NA 137 02/10/2020   K 5.3 (H) 02/10/2020   CL 106 02/10/2020   CO2 23 02/10/2020   Lab Results  Component Value Date   ALT 27 01/20/2020   AST 44 (H) 01/20/2020   ALKPHOS 39 01/20/2020   BILITOT 0.6 01/20/2020   Lab Results  Component Value Date   CHOL 110 01/20/2020   HDL 26 (L) 01/20/2020   LDLCALC 53 01/20/2020   TRIG 153 (H) 01/20/2020   CHOLHDL 4.2 01/20/2020    Lab Results  Component Value Date   HGBA1C 6.0 (H) 01/20/2020    Assessment & Plan    1.  Chronic systolic congestive heart failure/ischemic cardiomyopathy: Over the past several months, he has been experiencing progressive dyspnea on exertion and occasional lower extremity swelling with increase in abdominal girth.  He was found to be in atrial fibrillation at office visit in January.  He was subsequently admitted January 24 with chest pain and non-STEMI.  Echo showed worsening LV dysfunction with an EF of 20-25%, down from 40-50% previously.  Diagnostic catheterization revealed patent LAD and OM 2 stents with normal LVEDP of 12  mmHg.  At follow-up February 3, he noted more frequent need for as needed Lasix and ReDS Vest was mildly abnormal at 39%.  He did not tolerate daily diuretic secondary to worsening renal function and mild hyperkalemia.  I switched him from furosemide to torsemide however, torsemide has been on hold for the past week  secondary to rising creatinine to 3.10 on February 10.  Creatinine has since trended back down was 2.68 on February 15.  He has had mild hyperkalemia intermittently over several years.  On examination today, his weight is down 2 pounds.  He has mild, chronic ankle edema but no significant JVD.  Lungs are clear.  ReDS Vest reading today is at the high end of normal at 37%.  I will again follow-up a basic metabolic panel to trend his renal function and potassium.  He does not currently on a scale at home but will go by 1 today.  We discussed resumption of torsemide 20 mg daily as needed for weight gain of 3 pounds over 24 hours.  He otherwise remains on beta-blocker and ARB therapy.  With tenuous renal function and hyperkalemia, I am not going to change him to [facility] at this time.  Also, he is not a candidate for spironolactone in the setting of mild hyperkalemia.  As previously noted, it is unclear to what extent atrial fibrillation is playing a role in his worsening LV function and dyspnea.  I will see him back as previous scheduled on March 3 and at that point, we can schedule elective cardioversion.  2.  Coronary artery disease: Status post recent non-STEMI in January.  Catheterization at that time did not show any significant findings (patent LAD and OM 2 stents).  He has not had any recurrent chest pain.  He remains on beta-blocker and statin therapy.  Aspirin has been discontinued in the setting of Xarelto therapy and recent nosebleed.  3.  Persistent atrial fibrillation: Unknown duration.  He started having progressive dyspnea approximately 3 or more months ago.  As above, in the setting  worsening LV dysfunction we discussed pursuit of rhythm control and cardioversion after completion of 4 weeks of oral anticoagulation.  I will see him back in 2 weeks at which point, we can schedule this.  He is well rate controlled on beta-blocker therapy.  He remains on Xarelto 15 mg in the setting of chronic kidney disease.  4.  Essential hypertension: Stable on beta-blocker and ARB therapy.  5.  Hyperlipidemia: LDL of 53 in January 2021.  Continue lovastatin therapy.  6.  Stage III chronic kidney disease: Creatinine bumped to 3.1 on daily diuretic therapy and has been on hold for the past week.  Creatinine 2.68 on February 15 with a BUN of 76.  Follow-up today.  He will use torsemide 20 mg daily as needed going forward and will need to notify us if he is taking this more than once a week.  7.  Hypokalemia: Review of labs shows mild chronic elevation of potassium.  He is trying to maintain a low potassium diet.  He is on losartan therapy and in the setting of LV dysfunction, I prefer to continue this so long as potassium is stable, which has been recently.  Follow-up basic metabolic panel today.  8.  Disposition: Follow-up basic metabolic panel today.  Follow-up March 3 as planned.  Murray Hodgkins, NP 02/12/2020, 11:30 AM

## 2020-02-12 NOTE — Patient Instructions (Signed)
Medication Instructions:  1- STOP Aspirin 2- CHANGE Coreg Take 1 tablet (6.25 mg total) by mouth 2 (two) times daily with a meal 3- CHANGE Torsemide Take 1 tablet (20 mg total) by mouth daily as needed (weight gain > 3 lbs overnight).  *If you need a refill on your cardiac medications before your next appointment, please call your pharmacy*  Lab Work: Your physician recommends that you have lab work today(BMET)  If you have labs (blood work) drawn today and your tests are completely normal, you will receive your results only by: Marland Kitchen MyChart Message (if you have MyChart) OR . A paper copy in the mail If you have any lab test that is abnormal or we need to change your treatment, we will call you to review the results.  Testing/Procedures: None ordered   Follow-Up: At [facility], you and your health needs are our priority.  As part of our continuing mission to provide you with exceptional heart care, we have created designated Provider Care Teams.  These Care Teams include your primary Cardiologist (physician) and Advanced Practice Providers (APPs -  Physician Assistants and Nurse Practitioners) who all work together to provide you with the care you need, when you need it.  Your next appointment:   As ordered   Other Instructions Please take weights daily.  Call clinic with any weight gain of greater than  pounds in day or 5 pounds in a week and fluid pill not helping. Call for lower extremity swelling or SOB related to fluid volume over load including sleeping on more pillows or sitting up.    How to use a home scale Place the scale on a flat surface without carpet. Use a calendar, notebook paper, or log to track your weights and keep it by your scale. Each morning step on the scale after you empty your bladder. Weigh before eating or drinking. Always weigh in the same clothing or no clothing. Record your weight every day. Weighing twice a day is not necessary. If you choose to weigh in  the morning and evening make sure your doctor knows which weights you are talking about. Bring journal to each office visit.

## 2020-02-13 ENCOUNTER — Telehealth: Payer: Self-pay

## 2020-02-13 ENCOUNTER — Other Ambulatory Visit: Payer: Self-pay | Admitting: Physician Assistant

## 2020-02-13 ENCOUNTER — Other Ambulatory Visit: Payer: Self-pay | Admitting: Nurse Practitioner

## 2020-02-13 ENCOUNTER — Other Ambulatory Visit
Admission: RE | Admit: 2020-02-13 | Discharge: 2020-02-13 | Disposition: A | Discharge status: Home / Self Care | Payer: PPO | Source: Ambulatory Visit | Attending: Nurse Practitioner | Admitting: Nurse Practitioner

## 2020-02-13 ENCOUNTER — Telehealth: Payer: Self-pay | Admitting: Nurse Practitioner

## 2020-02-13 DIAGNOSIS — E875 Hyperkalemia: Secondary | ICD-10-CM | POA: Diagnosis not present

## 2020-02-13 LAB — BASIC METABOLIC PANEL
Anion gap: 10 (ref 5–15)
BUN/Creatinine Ratio: 23 (ref 10–24)
BUN: 55 mg/dL — ABNORMAL HIGH (ref 8–27)
BUN: 65 mg/dL — ABNORMAL HIGH (ref 8–23)
CO2: 22 mmol/L (ref 20–29)
CO2: 21 mmol/L — ABNORMAL LOW (ref 22–32)
Calcium: 8.9 mg/dL (ref 8.6–10.2)
Calcium: 9 mg/dL (ref 8.9–10.3)
Chloride: 104 mmol/L (ref 96–106)
Chloride: 104 mmol/L (ref 98–111)
Creatinine, Ser: 2.4 mg/dL — ABNORMAL HIGH (ref 0.76–1.27)
Creatinine, Ser: 2.95 mg/dL — ABNORMAL HIGH (ref 0.61–1.24)
GFR calc Af Amer: 27 mL/min/{1.73_m2} — ABNORMAL LOW (ref >59)
GFR calc Af Amer: 21 mL/min — ABNORMAL LOW (ref >60)
GFR calc non Af Amer: 23 mL/min/{1.73_m2} — ABNORMAL LOW (ref >59)
GFR calc non Af Amer: 18 mL/min — ABNORMAL LOW (ref >60)
Glucose, Bld: 110 mg/dL — ABNORMAL HIGH (ref 70–99)
Glucose: 107 mg/dL — ABNORMAL HIGH (ref 65–99)
Potassium: 6.9 mmol/L (ref 3.5–5.2)
Potassium: 5.7 mmol/L — ABNORMAL HIGH (ref 3.5–5.1)
Sodium: 136 mmol/L (ref 134–144)
Sodium: 135 mmol/L (ref 135–145)

## 2020-02-13 MED ORDER — SODIUM POLYSTYRENE SULFONATE 15 GM/60ML PO SUSP: 30.0000 g | Freq: Every day | ORAL | 1 refills | Status: DC

## 2020-02-13 NOTE — Telephone Encounter (Signed)
Call to patient to discuss critical K results received after hours from [place]. Pt was called by on call provider and order for Kayexalate was placed. I made call to patient per request from Ignacia Bayley, NP for patient to have [facility].   Pt verbalized understanding and will attempt to drive [facility] the medical mall for repeat labs. If weather is bad, he will stay home and call for ambulance to ED for treatment.   I gave patient my cell number to give any update on transportation needs or any other issues/concerns.   Order placed for stat BMET at medical mall.   I made call to pt preferred pharmacy to confirm they had Kayexalate if needed based on redraw. Todd, ensured that they do have medication available to pick up after 9 am if needed.

## 2020-02-13 NOTE — Progress Notes (Signed)
Paged by [facility] of K of 6.9. Patient already took his Losartan 100mg  this morning.  Sent Kayexalate 30mg  daily for two dose to his requested pharmacy. He will pick up rx this morning.   Will have Murray Hodgkins to decided Losartan dose. He will need BMET tomorrow afternoon. Please call patient later today with further instruction.

## 2020-02-13 NOTE — Telephone Encounter (Signed)
Call to patient to review labs and POC from Ignacia Bayley, NP.   Pt aware that it is okay to leave medical mall and he will go by pharmacy to pick up kayexalate. He will go to medical mall tomorrow for lab redraw.   He will hold losartan until further notice.   Pt reports he took torsemide yesterday and I made him aware that kidneys are try. Encouraged gentle rehydration in the setting of kayexalate admin.   Advised pt to call for any further questions or concerns.

## 2020-02-13 NOTE — Telephone Encounter (Signed)
Duplicate, see other phone encounter,

## 2020-02-13 NOTE — Telephone Encounter (Signed)
Patient calling in stating he received a call regarding blood work or that he needed to come get blood work done for a mediation. No documentation was seen so patient was told he would receive a call if it was from [place]  Please advise

## 2020-02-13 NOTE — Telephone Encounter (Signed)
-----   Message from Theora Gianotti, NP sent at 02/13/2020  9:53 AM EST ----- Potassium 5.7 on follow-up. Creat slightly higher than yesterday.  Stop losartan. Increase oral fluid intake.  Pick up the kayexalate 30mg  at his pharmacy and take x 1 today.  F/u bmet tomorrow morning [facility].

## 2020-02-14 ENCOUNTER — Other Ambulatory Visit
Admission: RE | Admit: 2020-02-14 | Discharge: 2020-02-14 | Disposition: A | Discharge status: Home / Self Care | Payer: PPO | Source: Ambulatory Visit | Attending: Nurse Practitioner | Admitting: Nurse Practitioner

## 2020-02-14 ENCOUNTER — Telehealth: Payer: Self-pay

## 2020-02-14 DIAGNOSIS — E875 Hyperkalemia: Secondary | ICD-10-CM | POA: Diagnosis not present

## 2020-02-14 LAB — BASIC METABOLIC PANEL
Anion gap: 10 (ref 5–15)
BUN: 60 mg/dL — ABNORMAL HIGH (ref 8–23)
CO2: 24 mmol/L (ref 22–32)
Calcium: 8.4 mg/dL — ABNORMAL LOW (ref 8.9–10.3)
Chloride: 103 mmol/L (ref 98–111)
Creatinine, Ser: 2.67 mg/dL — ABNORMAL HIGH (ref 0.61–1.24)
GFR calc Af Amer: 24 mL/min — ABNORMAL LOW (ref >60)
GFR calc non Af Amer: 21 mL/min — ABNORMAL LOW (ref >60)
Glucose, Bld: 114 mg/dL — ABNORMAL HIGH (ref 70–99)
Potassium: 4.3 mmol/L (ref 3.5–5.1)
Sodium: 137 mmol/L (ref 135–145)

## 2020-02-14 NOTE — Telephone Encounter (Signed)
Call attempted to discuss lab results and POC. No answer, no voicemail.

## 2020-02-14 NOTE — Telephone Encounter (Signed)
-----   Message from Theora Gianotti, NP sent at 02/14/2020  8:58 AM EST ----- Potassium normal. Kidney function a little better than yesterday.  Remain off of losartan. We can plan to repeat labs when I see him 3/3.  Weigh daily.  Use torsemide 20mg  only as needed for 3 lbs weight gain in 24 hrs or 5 lbs over the course of a week.

## 2020-02-14 NOTE — Telephone Encounter (Signed)
Call to patient to review lab results and POC from Ignacia Bayley, NP.   Pt verbalized understanding and is in agreement with POC.   Will continue to hold Losartan and will only take Torsemide PRN.   Advised pt to call for any further questions or concerns.   Confirmed upcoming appt 3/3.

## 2020-02-17 DIAGNOSIS — L820 Inflamed seborrheic keratosis: Secondary | ICD-10-CM | POA: Diagnosis not present

## 2020-02-17 DIAGNOSIS — X32XXXD Exposure to sunlight, subsequent encounter: Secondary | ICD-10-CM | POA: Diagnosis not present

## 2020-02-17 DIAGNOSIS — Z85828 Personal history of other malignant neoplasm of skin: Secondary | ICD-10-CM | POA: Diagnosis not present

## 2020-02-17 DIAGNOSIS — L570 Actinic keratosis: Secondary | ICD-10-CM | POA: Diagnosis not present

## 2020-02-17 DIAGNOSIS — Z08 Encounter for follow-up examination after completed treatment for malignant neoplasm: Secondary | ICD-10-CM | POA: Diagnosis not present

## 2020-02-26 ENCOUNTER — Ambulatory Visit (INDEPENDENT_AMBULATORY_CARE_PROVIDER_SITE_OTHER): Payer: PPO | Admitting: Nurse Practitioner

## 2020-02-26 ENCOUNTER — Telehealth: Payer: Self-pay | Admitting: Cardiovascular Disease

## 2020-02-26 ENCOUNTER — Encounter: Payer: Self-pay | Admitting: Nurse Practitioner

## 2020-02-26 ENCOUNTER — Other Ambulatory Visit: Payer: Self-pay

## 2020-02-26 VITALS — BP 110/60 | HR 63 | Resp 16 | Ht 66.0 in | Wt 213.0 lb

## 2020-02-26 DIAGNOSIS — I4819 Other persistent atrial fibrillation: Secondary | ICD-10-CM

## 2020-02-26 DIAGNOSIS — I739 Peripheral vascular disease, unspecified: Secondary | ICD-10-CM | POA: Diagnosis not present

## 2020-02-26 DIAGNOSIS — I10 Essential (primary) hypertension: Secondary | ICD-10-CM

## 2020-02-26 DIAGNOSIS — I2510 Atherosclerotic heart disease of native coronary artery without angina pectoris: Secondary | ICD-10-CM | POA: Diagnosis not present

## 2020-02-26 DIAGNOSIS — E875 Hyperkalemia: Secondary | ICD-10-CM

## 2020-02-26 DIAGNOSIS — N183 Chronic kidney disease, stage 3 unspecified: Secondary | ICD-10-CM

## 2020-02-26 DIAGNOSIS — E785 Hyperlipidemia, unspecified: Secondary | ICD-10-CM

## 2020-02-26 NOTE — Telephone Encounter (Signed)
Attempted to schedule.  LMOV to call office.   Patient needs LEA

## 2020-02-26 NOTE — Patient Instructions (Addendum)
Medication Instructions:  Your physician recommends that you continue on your current medications as directed. Please refer to the Current Medication list given to you today.  *If you need a refill on your cardiac medications before your next appointment, please call your pharmacy*   Lab Work: Bmet and Cbc today  You will a COVID test prior to the procedure. Please report to the [facility] medical arts drive up test site on 03/02/20 between 12:30-2:30pm.  If you have labs (blood work) drawn today and your tests are completely normal, you will receive your results only by: Marland Kitchen MyChart Message (if you have MyChart) OR . A paper copy in the mail If you have any lab test that is abnormal or we need to change your treatment, we will call you to review the results.   Testing/Procedures: Your physician has recommended that you have a Cardioversion (DCCV). Electrical Cardioversion uses a jolt of electricity to your heart either through paddles or wired patches attached to your chest. This is a controlled, usually prescheduled, procedure. Defibrillation is done under light anesthesia in the hospital, and you usually go home the day of the procedure. This is done to get your heart back into a normal rhythm. You are not awake for the procedure. Please see the instruction sheet given to you today.   Your physician has requested that you have a lower extremity arterial exercise duplex. During this test, exercise and ultrasound are used to evaluate arterial blood flow in the legs. Allow one hour for this exam. There are no restrictions or special instructions.  Your physician has requested that you have an ankle brachial index (ABI). During this test an ultrasound and blood pressure cuff are used to evaluate the arteries that supply the arms and legs with blood. Allow thirty minutes for this exam. There are no restrictions or special instructions.     Follow-Up: At [facility], you and your health needs are  our priority.  As part of our continuing mission to provide you with exceptional heart care, we have created designated Provider Care Teams.  These Care Teams include your primary Cardiologist (physician) and Advanced Practice Providers (APPs -  Physician Assistants and Nurse Practitioners) who all work together to provide you with the care you need, when you need it.  We recommend signing up for the patient portal called "MyChart".  Sign up information is provided on this After Visit Summary.  MyChart is used to connect with patients for Virtual Visits (Telemedicine).  Patients are able to view lab/test results, encounter notes, upcoming appointments, etc.  Non-urgent messages can be sent to your provider as well.   To learn more about what you can do with MyChart, go to [email or web address].    Your next appointment:   Keep your already scheduled appointment with Dr. Rockey Situ  The format for your next appointment:   In Person  Provider:   Ida Rogue, MD   Other Instructions You are scheduled for a Cardioversion on _3/11/21 @ 7:30am_with Dr.Gollan Please arrive at the [place] [facility] at _6:30_a.m. on the day of your procedure.  DIET INSTRUCTIONS:  Nothing to eat or drink after midnight except your medications with a              sip of water.         1) Labs: Today- COVID test on 03/02/20.  2) Medications:  YOU MAY TAKE ALL of your remaining medications with a small amount of water.  3) Must have  a responsible person to drive you home.  4) Bring a current list of your medications and current insurance cards.    If you have any questions after you get home, please call the office at [phone]

## 2020-02-26 NOTE — Addendum Note (Signed)
Addended by: Murray Hodgkins R on: 02/26/2020 05:20 PM   Modules accepted: Orders, SmartSet

## 2020-02-26 NOTE — Progress Notes (Addendum)
Office Visit    Patient Name: [name] [name] Date of Encounter: 02/26/2020  Primary Care Provider:  [name], MD Primary Cardiologist:  Ida Rogue, MD  Chief Complaint    86 year old male with a history of CAD status post prior LAD and second obtuse marginal stenting, hypertension, hyperlipidemia, ischemic cardiomyopathy with an EF of 20-25%, HFrEF, recently diagnosed atrial fibrillation, left bundle branch block, and stage III chronic kidney disease, who presents for follow-up of CHF and A. fib.  Past Medical History    Past Medical History:  Diagnosis Date  . Arthritis   . Asymptomatic Sinus bradycardia   . CAD (coronary artery disease)    a. 10/2005 NSTEMI/PCI: LAD 95 (2.75x33 Cypher DES); b. 2008 Ant STEMI/PCI LAD 2/2 ISR; c. 09/2011 PCI OM2; d. 02/2019 MV: Fixed mid-dist ant/apical, mid-apical inferolateral dfects w/o ischemia; e. 12/2019 Cath: LM nl, LAD 25p/m, patent stent, D2 80, LCX nl, OM2 patent stent, RCA min irregs.  . CKD (chronic kidney disease), stage III   . HFrEF (heart failure with reduced ejection fraction) ([place])    a. 12/2019 Echo: EF 20-25%, glob HK. Mid-apical septum, inf, apical AK. Mildly reduced RV fxn. Triv MR.  Marland Kitchen Hyperlipidemia   . Hypertension   . Ischemic cardiomyopathy    a. 07/2015 Echo: EF 45-50%; b. 12/2019 Echo: EF 20-25%.  Marland Kitchen LBBB (left bundle branch block)   . Myocardial infarction ([place])   . Persistent atrial fibrillation ([place])    a.  Diagnosed January 2021.  CHA2DS2VASc = 5--> xarelto.  . Tobacco user    Remote   Past Surgical History:  Procedure Laterality Date  . CARDIAC CATHETERIZATION  10/21/2011   s/p stent placement  . CARDIAC CATHETERIZATION  2008   stent placement  . CARDIAC CATHETERIZATION  2006   stent placement  . CARPAL TUNNEL RELEASE Right 07/22/2016   Procedure: CARPAL TUNNEL RELEASE;  Surgeon: Leanor Kail, MD;  Location: [facility];  Service: Orthopedics;  Laterality: Right;  . JOINT REPLACEMENT    . LEFT  HEART CATH AND CORONARY ANGIOGRAPHY N/A 01/20/2020   Procedure: LEFT HEART CATH AND CORONARY ANGIOGRAPHY;  Surgeon: Jettie Booze, MD;  Location: [facility];  Service: Cardiovascular;  Laterality: N/A;  . TOTAL HIP ARTHROPLASTY     Right  . TOTAL KNEE ARTHROPLASTY Left 06/29/2018   Procedure: LEFT TOTAL KNEE ARTHROPLASTY;  Surgeon: Frederik Pear, MD;  Location: [facility];  Service: Orthopedics;  Laterality: Left;  . TRANSURETHRAL RESECTION OF PROSTATE      Allergies  Allergies  Allergen Reactions  . Amoxicillin Rash    Has patient had a PCN reaction causing immediate rash, facial/tongue/throat swelling, SOB or lightheadedness with hypotension: Unknown Has patient had a PCN reaction causing severe rash involving mucus membranes or skin necrosis: No Has patient had a PCN reaction that required hospitalization: No Has patient had a PCN reaction occurring within the last 10 years: Yes If all of the above answers are "NO", then may proceed with Cephalosporin use.     History of Present Illness    86 year old male with above complex past medical history including CAD, hypertension, hyperlipidemia, ischemic cardiomyopathy, HFrEF, recently diagnosed atrial fibrillation, left bundle branch block, and stage III chronic kidney disease.  In November 2006, he suffered a non-STEMI and required drug-eluting stent placement to his LAD.  He had a relook catheterization 5 days later, which showed patency of the stent.  Unfortunately, in December 2008, he suffered an anterior STEMI  in the setting of in-stent restenosis within the LAD, and required repeat intervention.  In October 2012, he required PCI of the OM 2.  He had a nonischemic stress test in March 2020 was diagnosed with atrial fibrillation of unknown duration on January 14, 2020.  Warfarin therapy was initiated at that time.  Unfortunate, he was admitted to [facility] on January 19, 2020 after presenting with left-sided chest pressure radiating  to his arm associated with diaphoresis.  High-sensitivity troponin was elevated at 916 and subsequently peaked at approximately 5800, consistent with non-STEMI.  Warfarin was held and he was placed on heparin underwent diagnostic catheterization revealing patent LAD and obtuse marginal stents with small vessel diagonal disease.  Echocardiogram during admission showed an EF of 20-25%.  Warfarin was resumed post catheterization but later changed to Xarelto by primary care.  Following discharge, he noted more frequent need for Lasix usage.  He was switched from Lasix to torsemide 20 mg daily however this resulted in rising creatinine to 3.10 along with mild hyperkalemia.  Torsemide was held.  Follow-up labs on February 15 showed slight improvement in creatinine to 2.68.  At office follow-up on February 17, he reported feeling reasonably well and volume was stable.  ReDS Vest reading was on the high end of normal at 37%.  Follow-up lab work on February 17 showed continued improvement in creatinine is 2.40 however, significant hyperkalemia was noted at 6.9.  No hemolysis was noted.  He was advised to hold losartan and a follow-up stat basic metabolic panel showed continued elevation at 5.7.  He was treated with Kayexalate 30 mg on February 18 and follow-up potassium on February 19 was normal at 4.3.  Torsemide was resumed at 20 mg as needed for weight gain greater than 3 pounds in 24 hours.  Since his last visit, he has done well.  He has noticed some reduction in weight at home and he is down to 213 pounds on our scale today.  He feels as though his dyspnea has been stable.  He has not been having any significant lower extremity swelling and says he only takes a torsemide about once a week.  He denies any chest pain, palpitations, PND, orthopnea, dizziness, syncope, or early satiety.  He remains in atrial fibrillation today and is interested in pursuing cardioversion.  He says today that he has been noticing  bilateral thigh and calf pain that occurs with ambulation.  It affects both sides equally.  Symptoms typically occur after walking about 25 to 30 feet and are associated with a feeling of leg heaviness.  Symptoms resolved with rest.  He is interested in lower extremity arterial duplex/ABIs.  Finally, he noted that yesterday morning he had dark urine and he felt that there was blood in it.  He has not had any recurrent hematuria.  He used to see urology for BPH but his urologist has since retired.  He would like to defer referral unless he has recurrence.  Home Medications    Prior to Admission medications   Medication Sig Start Date End Date Taking? Authorizing Provider  carvedilol (COREG) 6.25 MG tablet Take 1 tablet (6.25 mg total) by mouth 2 (two) times daily with a meal. 02/12/20   Theora Gianotti, NP  losartan (COZAAR) 100 MG tablet Take 100 mg by mouth daily. 11/09/18   [provider]  lovastatin (MEVACOR) 40 MG tablet Take 40 mg by mouth at bedtime.    [provider]  nitroGLYCERIN (NITROSTAT) 0.4  MG SL tablet Place 0.4 mg under the tongue every 5 (five) minutes as needed for chest pain.    [provider]  Rivaroxaban (XARELTO) 15 MG TABS tablet Take 15 mg by mouth daily with supper.    [provider]  sodium polystyrene (KAYEXALATE) 15 GM/60ML suspension Take 120 mLs (30 g total) by mouth daily. 02/13/20   Bhagat, Crista Luria, PA  tiZANidine (ZANAFLEX) 2 MG tablet Take 1 tablet (2 mg total) by mouth every 6 (six) hours as needed. 06/29/18   Leighton Parody, PA-C  torsemide (DEMADEX) 20 MG tablet Take 1 tablet (20 mg total) by mouth daily as needed (weight gain > 3 lbs overnight). 02/12/20   Theora Gianotti, NP   Family History    No premature CAD. Parents deceased.  Social History    Social History   Socioeconomic History  . Marital status: Married    Spouse name: Not on file  . Number of children: Not on file  . Years of  education: Not on file  . Highest education level: Not on file  Occupational History  . Occupation: Retired    Fish farm manager: [employer]  Tobacco Use  . Smoking status: Former Smoker    Packs/day: 1.00    Years: 20.00    Pack years: 20.00    Types: Cigarettes    Quit date: 12/06/1968    Years since quitting: 51.2  . Smokeless tobacco: Never Used  Substance and Sexual Activity  . Alcohol use: No  . Drug use: No  . Sexual activity: Not on file  Other Topics Concern  . Not on file  Social History Narrative   Married   Lives in [place] with his wife   Social Determinants of Health   Financial Resource Strain:   . Difficulty of Paying Living Expenses: Not on file  Food Insecurity:   . Worried About Charity fundraiser in the Last Year: Not on file  . Ran Out of Food in the Last Year: Not on file  Transportation Needs:   . Lack of Transportation (Medical): Not on file  . Lack of Transportation (Non-Medical): Not on file  Physical Activity:   . Days of Exercise per Week: Not on file  . Minutes of Exercise per Session: Not on file  Stress:   . Feeling of Stress : Not on file  Social Connections:   . Frequency of Communication with Friends and Family: Not on file  . Frequency of Social Gatherings with Friends and Family: Not on file  . Attends Religious Services: Not on file  . Active Member of Clubs or Organizations: Not on file  . Attends Archivist Meetings: Not on file  . Marital Status: Not on file  Intimate Partner Violence:   . Fear of Current or Ex-Partner: Not on file  . Emotionally Abused: Not on file  . Physically Abused: Not on file  . Sexually Abused: Not on file    Review of Systems    Some degree of chronic DOE, which has been stable.  He notes limitation in ambulation 2/2 bilateral thigh/calf pain that begins after walking only ~ 25-30 ft. one episode of dark urine yesterday morning.  No recurrence.  He denies chest pain, palpitations, pnd,  orthopnea, n, v, dizziness, syncope, edema, weight gain, or early satiety.  All other systems reviewed and are otherwise negative except as noted above.  Physical Exam    VS:  BP 110/60   Pulse 63  Resp 16   Ht 5\' 6"  (1.676 m)   Wt 213 lb (96.6 kg)   SpO2 98%   BMI 34.38 kg/m  , BMI Body mass index is 34.38 kg/m. GEN: Well nourished, well developed, in no acute distress. HEENT: normal. Neck: Supple, no JVD, carotid bruits, or masses. Cardiac: Irregularly irregular, no murmurs, rubs, or gallops. No clubbing, cyanosis, trace bilateral lower extremity edema.  Radials/DP/PT 1+ and equal bilaterally.  Respiratory:  Respirations regular and unlabored, clear to auscultation bilaterally. GI: Soft, nontender, nondistended, BS + x 4. MS: no deformity or atrophy. Skin: warm and dry, no rash. Neuro:  Strength and sensation are intact. Psych: Normal affect.  Accessory Clinical Findings    ECG personally reviewed by me today -atrial fibrillation, 63, left axis deviation, left bundle branch block - no acute changes.  Lab Results  Component Value Date   WBC 8.5 01/21/2020   HGB 13.6 01/21/2020   HCT 41.5 01/21/2020   MCV 104.3 (H) 01/21/2020   PLT 98 (L) 01/21/2020   Lab Results  Component Value Date   CREATININE 2.67 (H) 02/14/2020   BUN 60 (H) 02/14/2020   NA 137 02/14/2020   K 4.3 02/14/2020   CL 103 02/14/2020   CO2 24 02/14/2020   Lab Results  Component Value Date   ALT 27 01/20/2020   AST 44 (H) 01/20/2020   ALKPHOS 39 01/20/2020   BILITOT 0.6 01/20/2020   Lab Results  Component Value Date   CHOL 110 01/20/2020   HDL 26 (L) 01/20/2020   LDLCALC 53 01/20/2020   TRIG 153 (H) 01/20/2020   CHOLHDL 4.2 01/20/2020    Lab Results  Component Value Date   HGBA1C 6.0 (H) 01/20/2020    Assessment & Plan    1.  Chronic systolic congestive heart failure/ischemic cardiomyopathy: Patient has been experiencing progressive dyspnea on exertion and lower extremity swelling with  increasing abdominal girth over a several month..  He was found to be in atrial fibrillation at office visit in January.  He was subsequently admitted January 24 with chest pain and non-STEMI.  Echo showed worsening LV dysfunction with an EF of 20-25%, down from 40-50% previously.  Diagnostic catheterization revealed patent LAD and OM 2 stents with normal LVEDP of 12 mmHg.  We have had to adjust his outpatient diuretic and even hold it for period of time secondary to acute kidney injury and hyperkalemia.  He is now on torsemide 20 mg as needed for weight gain of greater than 3 pounds and he has only required this about once a week over the past few weeks.  Losartan therapy has been discontinued secondary to hyperkalemia which required outpatient treatment with Kayexalate recently.  He remains in atrial fibrillation today and is unclear to what extent this is playing a role in LV dysfunction and heart failure symptoms.  Weight is down to 213 pounds today and he is euvolemic.  I will arrange for cardioversion as he has now been on Xarelto for greater than 4 weeks without missing any doses.  Follow-up lab work today.  2.  Persistent atrial fibrillation: Diagnosed in January.  Unclear role in LV dysfunction and increase in heart failure symptoms as outlined above.  He has been well rate controlled on beta-blocker therapy.  He has been anticoagulated with Xarelto 15mg  daily (CHA2DS2VASc equals 5; creat cl 29) and has not missed any doses.  Given EF of 20-25% noted in the setting of A. fib in January, I will pursue direct-current  cardioversion to see if restoration of sinus rhythm improves LV function and heart failure symptoms further.  We discussed the low risk nature of direct current cardioversion today with risks including ventricular arrhythmias, thromboembolus due to insufficient anticoagulant therapy, atrial arrhythmias, heart block, bradycardia, transient left bundle branch block, myocardial necrosis,  myocardial dysfunction, transient hypotension, pulmonary edema, and skin burn and he is willing to proceed.  Atrial sizes were normal on echo in January.    3.  Coronary artery disease: Status post recent non-STEMI in January.  Catheterization at that time did not show any significant findings (patent LAD and OM 2 stents).  He has not any recurrent chest pain.  Remains on beta-blocker and statin therapy.  Aspirin has been discontinued in the setting of Xarelto therapy.  4.  Lower extremity claudication: Patient notes bilateral thigh and calf heaviness and pain with ambulation of only about 25 feet.  Symptoms seem to resolve with rest.  I will arrange for lower extremity ABIs and aortoiliac duplex.  Distal pulses are 1+.  5.  Hematuria: Patient reported to me that he had one episode of dark urine yesterday morning.  He has had no additional hematuria.  He has not had any symptoms to suggest a urinary tract infection.  I discussed referral to urology however, he would like to hold off unless he were to have recurrent hematuria.  He knows to contact either [place] or primary care for referral if this occurs.  6.  Essential hypertension: Stable on beta-blocker therapy.  Despite discontinuation of losartan, pressures have been mostly in the 120s at home.  7.  Hyperkalemia: At last visit on February 17, potassium was elevated at 6.9.  Remained elevated on repeat and he was treated with Kayexalate.  Losartan has since been discontinued.  Follow-up basic metabolic panel today.  8.  HL:  LDL 53 in 12/2019.  Cont statin rx.  9.  CKD III:  F/u bmet today.  ARB d/c'd 2/2 hyperkalemia.  10.  Disposition: Follow-up CBC and basic metabolic panel today.  We will plan to schedule for cardioversion next week.  Follow-up approximately 2 to 3 weeks post cardioversion.  Follow-up ABIs/aortoiliac duplex as outlined above.  Murray Hodgkins, NP 02/26/2020, 12:17 PM

## 2020-02-27 ENCOUNTER — Other Ambulatory Visit
Admission: RE | Admit: 2020-02-27 | Discharge: 2020-02-27 | Disposition: A | Discharge status: Home / Self Care | Payer: PPO | Source: Ambulatory Visit | Attending: Nurse Practitioner | Admitting: Nurse Practitioner

## 2020-02-27 DIAGNOSIS — E875 Hyperkalemia: Secondary | ICD-10-CM

## 2020-02-27 DIAGNOSIS — I4819 Other persistent atrial fibrillation: Secondary | ICD-10-CM | POA: Diagnosis not present

## 2020-02-27 LAB — BASIC METABOLIC PANEL
Anion gap: 8 (ref 5–15)
BUN/Creatinine Ratio: 19 (ref 10–24)
BUN: 53 mg/dL — ABNORMAL HIGH (ref 8–27)
BUN: 62 mg/dL — ABNORMAL HIGH (ref 8–23)
CO2: 20 mmol/L (ref 20–29)
CO2: 24 mmol/L (ref 22–32)
Calcium: 9.4 mg/dL (ref 8.6–10.2)
Calcium: 9.3 mg/dL (ref 8.9–10.3)
Chloride: 100 mmol/L (ref 96–106)
Chloride: 104 mmol/L (ref 98–111)
Creatinine, Ser: 2.82 mg/dL — ABNORMAL HIGH (ref 0.76–1.27)
Creatinine, Ser: 2.68 mg/dL — ABNORMAL HIGH (ref 0.61–1.24)
GFR calc Af Amer: 22 mL/min/{1.73_m2} — ABNORMAL LOW (ref >59)
GFR calc Af Amer: 24 mL/min — ABNORMAL LOW (ref >60)
GFR calc non Af Amer: 19 mL/min/{1.73_m2} — ABNORMAL LOW (ref >59)
GFR calc non Af Amer: 20 mL/min — ABNORMAL LOW (ref >60)
Glucose, Bld: 110 mg/dL — ABNORMAL HIGH (ref 70–99)
Glucose: 106 mg/dL — ABNORMAL HIGH (ref 65–99)
Potassium: 6.1 mmol/L — ABNORMAL HIGH (ref 3.5–5.2)
Potassium: 5.8 mmol/L — ABNORMAL HIGH (ref 3.5–5.1)
Sodium: 137 mmol/L (ref 134–144)
Sodium: 136 mmol/L (ref 135–145)

## 2020-02-27 LAB — CBC WITH DIFFERENTIAL/PLATELET
Basophils Absolute: 0 10*3/uL (ref 0.0–0.2)
Basos: 1 %
EOS (ABSOLUTE): 0.3 10*3/uL (ref 0.0–0.4)
Eos: 4 %
Hematocrit: 43.6 % (ref 37.5–51.0)
Hemoglobin: 14.9 g/dL (ref 13.0–17.7)
Immature Grans (Abs): 0 10*3/uL (ref 0.0–0.1)
Immature Granulocytes: 0 %
Lymphocytes Absolute: 2.2 10*3/uL (ref 0.7–3.1)
Lymphs: 30 %
MCH: 34.5 pg — ABNORMAL HIGH (ref 26.6–33.0)
MCHC: 34.2 g/dL (ref 31.5–35.7)
MCV: 101 fL — ABNORMAL HIGH (ref 79–97)
Monocytes Absolute: 0.8 10*3/uL (ref 0.1–0.9)
Monocytes: 11 %
Neutrophils Absolute: 4.1 10*3/uL (ref 1.4–7.0)
Neutrophils: 54 %
Platelets: 162 10*3/uL (ref 150–450)
RBC: 4.32 x10E6/uL (ref 4.14–5.80)
RDW: 12.9 % (ref 11.6–15.4)
WBC: 7.5 10*3/uL (ref 3.4–10.8)

## 2020-02-27 NOTE — Telephone Encounter (Signed)
Preliminary results reviewed and forwarded to Ignacia Bayley, NP.

## 2020-02-27 NOTE — Telephone Encounter (Signed)
Call to patient to discuss lab result and POC from Ignacia Bayley, NP. Spoke to wife, Barnett Applebaum, okay per DPR.   She verbalized understanding and will communicated POC to pt when he returns home.   Advised pt to call for any further questions or concerns.   Order placed for MM repeat BMET.

## 2020-02-27 NOTE — Telephone Encounter (Signed)
Call to patient to review verbal POC received from Ignacia Bayley, NP.   Pt to pick up refill of kayexalate that is already at pharmacy. He will return to [facility] tomorrow morning.

## 2020-02-27 NOTE — Telephone Encounter (Signed)
Patient calling in to state he has completed his blood work

## 2020-02-28 ENCOUNTER — Other Ambulatory Visit
Admission: RE | Admit: 2020-02-28 | Discharge: 2020-02-28 | Disposition: A | Discharge status: Home / Self Care | Payer: PPO | Source: Ambulatory Visit | Attending: Cardiovascular Disease | Admitting: Cardiovascular Disease

## 2020-02-28 DIAGNOSIS — I4819 Other persistent atrial fibrillation: Secondary | ICD-10-CM | POA: Diagnosis not present

## 2020-02-28 LAB — BASIC METABOLIC PANEL
Anion gap: 8 (ref 5–15)
BUN: 55 mg/dL — ABNORMAL HIGH (ref 8–23)
CO2: 27 mmol/L (ref 22–32)
Calcium: 9 mg/dL (ref 8.9–10.3)
Chloride: 101 mmol/L (ref 98–111)
Creatinine, Ser: 2.7 mg/dL — ABNORMAL HIGH (ref 0.61–1.24)
GFR calc Af Amer: 23 mL/min — ABNORMAL LOW (ref >60)
GFR calc non Af Amer: 20 mL/min — ABNORMAL LOW (ref >60)
Glucose, Bld: 114 mg/dL — ABNORMAL HIGH (ref 70–99)
Potassium: 4.7 mmol/L (ref 3.5–5.1)
Sodium: 136 mmol/L (ref 135–145)

## 2020-02-28 NOTE — Telephone Encounter (Signed)
I called [name] back and let him know preliminary lab results.   He said his nephrologist had retired and he doesn't currently have one but his friends go to a doctor in [place] and he will call that office today. I told him if they needed a referral or werent taking new patients to call us back.   Labs routed to Ignacia Bayley, NP for further review.

## 2020-03-02 ENCOUNTER — Telehealth: Payer: Self-pay | Admitting: Nurse Practitioner

## 2020-03-02 ENCOUNTER — Other Ambulatory Visit
Admission: RE | Admit: 2020-03-02 | Discharge: 2020-03-02 | Disposition: A | Discharge status: Home / Self Care | Payer: PPO | Source: Ambulatory Visit | Attending: Nurse Practitioner | Admitting: Nurse Practitioner

## 2020-03-02 DIAGNOSIS — Z01812 Encounter for preprocedural laboratory examination: Secondary | ICD-10-CM | POA: Diagnosis not present

## 2020-03-02 DIAGNOSIS — Z20822 Contact with and (suspected) exposure to covid-19: Secondary | ICD-10-CM | POA: Insufficient documentation

## 2020-03-02 DIAGNOSIS — N1831 Chronic kidney disease, stage 3a: Secondary | ICD-10-CM

## 2020-03-02 DIAGNOSIS — N183 Chronic kidney disease, stage 3 unspecified: Secondary | ICD-10-CM

## 2020-03-02 DIAGNOSIS — E875 Hyperkalemia: Secondary | ICD-10-CM

## 2020-03-02 NOTE — Telephone Encounter (Signed)
Patient wants referral to see Dr. Tresa Endo Urologist in [place]

## 2020-03-02 NOTE — Telephone Encounter (Addendum)
Spoke with the patient. Advised him that Dr. Rosana Hoes is a Urologist and he needs to be seen by Nephrology for his kidney function and elevated potassium levels.  Patient sts that he use to see Dr. Ernst Spell and would like to get back in with him again. Advised the patient that Dr. Ernst Spell is a urologist as well and not a nephrologist.  The patient is in agreement with being referred to [facility] Dr. Candiss Norse.  Referral placed and  faxed to [facility] including, demo info, last o/v .  Advised the patient that their office will contact him directly to schedule. Provided the patient their office address and telephone number.  Patient voiced appreciation for the assistance.  Fax confirmation received.

## 2020-03-03 LAB — SARS CORONAVIRUS 2 (TAT 6-24 HRS): SARS Coronavirus 2: NEGATIVE

## 2020-03-03 NOTE — Telephone Encounter (Signed)
Patient calling in regarding nephrology referral. Patient was made aware the referral was sent but he states he hasn't heard from them and looked into an office in [place]. Patient would like to have a referral sent to [place] to see if he would be able to be seen there faster.   Please advise

## 2020-03-03 NOTE — Telephone Encounter (Signed)
Call to patient to make him aware that I am sending to [place] [facility] as well. I provided number for [place] and [place] locations and he can get in with either one (first available per pt preference).   Advised pt to call for any further questions or concerns.

## 2020-03-05 ENCOUNTER — Ambulatory Visit: Payer: PPO | Admitting: Anesthesiology

## 2020-03-05 ENCOUNTER — Encounter: Payer: Self-pay | Admitting: Cardiovascular Disease

## 2020-03-05 ENCOUNTER — Ambulatory Visit
Admission: RE | Admit: 2020-03-05 | Discharge: 2020-03-05 | Disposition: A | Discharge status: Home / Self Care | Payer: PPO | Attending: Cardiovascular Disease | Admitting: Cardiovascular Disease

## 2020-03-05 ENCOUNTER — Encounter
Admission: RE | Disposition: A | Discharge status: Home / Self Care | Payer: Self-pay | Source: Home / Self Care | Attending: Cardiovascular Disease

## 2020-03-05 ENCOUNTER — Telehealth: Payer: Self-pay | Admitting: Nurse Practitioner

## 2020-03-05 ENCOUNTER — Other Ambulatory Visit: Payer: Self-pay

## 2020-03-05 DIAGNOSIS — Z7901 Long term (current) use of anticoagulants: Secondary | ICD-10-CM | POA: Diagnosis not present

## 2020-03-05 DIAGNOSIS — I4891 Unspecified atrial fibrillation: Secondary | ICD-10-CM | POA: Diagnosis not present

## 2020-03-05 DIAGNOSIS — N183 Chronic kidney disease, stage 3 unspecified: Secondary | ICD-10-CM | POA: Insufficient documentation

## 2020-03-05 DIAGNOSIS — E875 Hyperkalemia: Secondary | ICD-10-CM | POA: Diagnosis not present

## 2020-03-05 DIAGNOSIS — I5022 Chronic systolic (congestive) heart failure: Secondary | ICD-10-CM | POA: Diagnosis not present

## 2020-03-05 DIAGNOSIS — Z87891 Personal history of nicotine dependence: Secondary | ICD-10-CM | POA: Insufficient documentation

## 2020-03-05 DIAGNOSIS — E785 Hyperlipidemia, unspecified: Secondary | ICD-10-CM | POA: Insufficient documentation

## 2020-03-05 DIAGNOSIS — Z79899 Other long term (current) drug therapy: Secondary | ICD-10-CM | POA: Insufficient documentation

## 2020-03-05 DIAGNOSIS — Z96641 Presence of right artificial hip joint: Secondary | ICD-10-CM | POA: Diagnosis not present

## 2020-03-05 DIAGNOSIS — I739 Peripheral vascular disease, unspecified: Secondary | ICD-10-CM | POA: Insufficient documentation

## 2020-03-05 DIAGNOSIS — R319 Hematuria, unspecified: Secondary | ICD-10-CM | POA: Diagnosis not present

## 2020-03-05 DIAGNOSIS — I255 Ischemic cardiomyopathy: Secondary | ICD-10-CM | POA: Diagnosis not present

## 2020-03-05 DIAGNOSIS — I2510 Atherosclerotic heart disease of native coronary artery without angina pectoris: Secondary | ICD-10-CM | POA: Insufficient documentation

## 2020-03-05 DIAGNOSIS — I4819 Other persistent atrial fibrillation: Secondary | ICD-10-CM | POA: Diagnosis not present

## 2020-03-05 DIAGNOSIS — I10 Essential (primary) hypertension: Secondary | ICD-10-CM | POA: Diagnosis not present

## 2020-03-05 DIAGNOSIS — I130 Hypertensive heart and chronic kidney disease with heart failure and stage 1 through stage 4 chronic kidney disease, or unspecified chronic kidney disease: Secondary | ICD-10-CM | POA: Diagnosis not present

## 2020-03-05 DIAGNOSIS — I252 Old myocardial infarction: Secondary | ICD-10-CM | POA: Diagnosis not present

## 2020-03-05 DIAGNOSIS — K219 Gastro-esophageal reflux disease without esophagitis: Secondary | ICD-10-CM | POA: Diagnosis not present

## 2020-03-05 HISTORY — PX: CARDIOVERSION: SHX1299

## 2020-03-05 SURGERY — CARDIOVERSION
Anesthesia: General

## 2020-03-05 MED ORDER — PROPOFOL 10 MG/ML IV BOLUS
INTRAVENOUS | Status: AC
  Filled 2020-03-05: qty 20

## 2020-03-05 MED ORDER — SODIUM CHLORIDE 0.9 % IV SOLN: INTRAVENOUS | Status: DC

## 2020-03-05 MED ORDER — EPHEDRINE 5 MG/ML INJ
INTRAVENOUS | Status: AC
  Filled 2020-03-05: qty 10

## 2020-03-05 MED ORDER — PROPOFOL 10 MG/ML IV BOLUS
INTRAVENOUS | Status: DC | PRN
  Administered 2020-03-05: 30 mg via INTRAVENOUS

## 2020-03-05 MED ORDER — PHENYLEPHRINE HCL (PRESSORS) 10 MG/ML IV SOLN
INTRAVENOUS | Status: AC
  Filled 2020-03-05: qty 1

## 2020-03-05 MED ORDER — SUCCINYLCHOLINE CHLORIDE 200 MG/10ML IV SOSY
PREFILLED_SYRINGE | INTRAVENOUS | Status: AC
  Filled 2020-03-05: qty 10

## 2020-03-05 NOTE — Telephone Encounter (Signed)
Patient states he is having trouble still getting an appointment with [place] Patient states that he would prefer to see Dr Juleen China if possible Please call to discuss

## 2020-03-05 NOTE — CV Procedure (Signed)
Cardioversion procedure note For atrial fibrillation, persistent.  Procedure Details:  Consent: Risks of procedure as well as the alternatives and risks of each were explained to the (patient/caregiver). Consent for procedure obtained.  Time Out: Verified patient identification, verified procedure, site/side was marked, verified correct patient position, special equipment/implants available, medications/allergies/relevent history reviewed, required imaging and test results available. Performed  Patient placed on cardiac monitor, pulse oximetry, supplemental oxygen as necessary.  Sedation given: propofol IV, Dr. Bertell Maria Pacer pads placed anterior and posterior chest.   Cardioverted 1 time(s).  Cardioverted at  150 J. Synchronized biphasic Converted to NSR   Evaluation: Findings: Post procedure EKG shows: NSR Complications: None Patient did tolerate procedure well.  Time Spent Directly with the Patient:  81 minutes   Esmond Plants, M.D., Ph.D.

## 2020-03-05 NOTE — Telephone Encounter (Signed)
Nurse from [facility] states patient would like to go to [place] . She states they do not have an office in [place]. States there is a [employer] in [place], but is not affiliated with [employer]. Please call patient to discuss

## 2020-03-05 NOTE — Telephone Encounter (Signed)
Pt is aware that fax has been sent to [facility]. He verbalized understanding that it may take some time to process. I advised that he give staff a few days before calling back. Pt agreed.   Advised pt to call for any further questions or concerns.

## 2020-03-05 NOTE — H&P (Signed)
H&P Addendum, pre-cardioversion  Patient was seen and evaluated prior to -cardioversion procedure Symptoms, prior testing details again confirmed with the patient Patient examined, no significant change from prior exam Lab work reviewed in detail personally by myself Patient understands risk and benefit of the procedure, willing to proceed  Signed, Esmond Plants, MD, Ph.D [facility]

## 2020-03-05 NOTE — Telephone Encounter (Signed)
Returned call to [employer]. She reported that pt stated he did not want to be seen in [place]. She explained to patient [facility] is not affiliated with their business.   I will call pt for update. See separate encounter.

## 2020-03-05 NOTE — Anesthesia Preprocedure Evaluation (Addendum)
Anesthesia Evaluation  Patient identified by MRN, date of birth, ID band Patient awake    Reviewed: Allergy & Precautions, NPO status , Patient's Chart, lab work & pertinent test results  History of Anesthesia Complications Negative for: history of anesthetic complications  Airway Mallampati: II  TM Distance: >3 FB Neck ROM: Full    Dental  (+) Teeth Intact, Poor Dentition   Pulmonary shortness of breath and with exertion, neg sleep apnea, neg COPD, Patient abstained from smoking.Not current smoker, former smoker,    Pulmonary exam normal breath sounds clear to auscultation       Cardiovascular Exercise Tolerance: Good METShypertension, + CAD, + Past MI and +CHF  + dysrhythmias Atrial Fibrillation  Rhythm:Irregular Rate:Normal - Systolic murmurs TTE 5188: Marland Kitchen Left ventricular ejection fraction, by visual estimation, is 20 to  25%. The left ventricle has normal function. Left ventricular septal wall  thickness was normal. Normal left ventricular posterior wall thickness.  There is no left ventricular  hypertrophy.  2. Definity contrast agent was given IV to delineate the left ventricular  endocardial borders.  3. Left ventricular diastolic parameters are indeterminate.  4. Moderately dilated left ventricular internal cavity size.  5. The left ventricle demonstrates regional wall motion abnormalities.  6. Global hypokinesis. Akinesis of the mid-apical septum, mid-apical  inferior, and apical myocardium.  7. Global right ventricle has mildly reduced systolic function.The right  ventricular size is normal. No increase in right ventricular wall  thickness.  8. Left atrial size was normal.  9. Right atrial size was normal.  10. The mitral valve is normal in structure. Trivial mitral valve  regurgitation. No evidence of mitral stenosis.  11. The tricuspid valve is normal in structure.  12. The tricuspid valve is normal in  structure. Tricuspid valve  regurgitation is not demonstrated.  13. The aortic valve is normal in structure. Aortic valve regurgitation is  not visualized. No evidence of aortic valve sclerosis or stenosis.  14. The pulmonic valve was normal in structure. Pulmonic valve  regurgitation is not visualized.  15. The inferior vena cava is normal in size with greater than 50%  respiratory variability, suggesting right atrial pressure of 3 mmHg.   [place] 2021:  Previously placed 2nd Mrg stent (unknown type) is widely patent.  Previously placed Mid LAD stent (unknown type) is widely patent.  Prox LAD to Mid LAD lesion is 25% stenosed.  2nd Diag lesion is 80% stenosed.  LV end diastolic pressure is normal.  There is no aortic valve stenosis.   Nonobstructive CAD. Continue medical therapy   Plays golf, but gets SOB with exertion   Neuro/Psych negative neurological ROS  negative psych ROS   GI/Hepatic GERD  Controlled,(+)     (-) substance abuse  ,   Endo/Other  neg diabetes  Renal/GU CRFRenal diseasenegative Renal ROS     Musculoskeletal   Abdominal   Peds  Hematology   Anesthesia Other Findings Past Medical History: No date: Arthritis No date: Asymptomatic Sinus bradycardia No date: CAD (coronary artery disease)     Comment:  a. 10/2005 NSTEMI/PCI: LAD 95 (2.75x33 Cypher DES); b.               2008 Ant STEMI/PCI LAD 2/2 ISR; c. 09/2011 PCI OM2; d.               02/2019 MV: Fixed mid-dist ant/apical, mid-apical               inferolateral dfects w/o ischemia; e. 12/2019  Cath: LM nl,              LAD 25p/m, patent stent, D2 80, LCX nl, OM2 patent stent,              RCA min irregs. No date: CKD (chronic kidney disease), stage III No date: HFrEF (heart failure with reduced ejection fraction) ([place])     Comment:  a. 12/2019 Echo: EF 20-25%, glob HK. Mid-apical septum,               inf, apical AK. Mildly reduced RV fxn. Triv MR. No date: Hyperlipidemia No date:  Hypertension No date: Ischemic cardiomyopathy     Comment:  a. 07/2015 Echo: EF 45-50%; b. 12/2019 Echo: EF 20-25%. No date: LBBB (left bundle branch block) No date: Myocardial infarction ([place]) No date: Persistent atrial fibrillation ([place])     Comment:  a.  Diagnosed January 2021.  CHA2DS2VASc = 5--> xarelto. No date: Tobacco user     Comment:  Remote  Reproductive/Obstetrics                            Anesthesia Physical Anesthesia Plan  ASA: III  Anesthesia Plan: General   Post-op Pain Management:    Induction: Intravenous  PONV Risk Score and Plan: 2 and Ondansetron, Propofol infusion and TIVA  Airway Management Planned: Nasal Cannula  Additional Equipment: None  Intra-op Plan:   Post-operative Plan:   Informed Consent: I have reviewed the patients History and Physical, chart, labs and discussed the procedure including the risks, benefits and alternatives for the proposed anesthesia with the patient or authorized representative who has indicated his/her understanding and acceptance.     Dental advisory given  Plan Discussed with: CRNA and Surgeon  Anesthesia Plan Comments: (Discussed risks of anesthesia with patient, including possibility of difficulty with spontaneous ventilation under anesthesia necessitating airway intervention, PONV, and rare risks such as cardiac or respiratory or neurological events. Patient understands. Patient counseled on being higher risk for anesthesia. Patient was told about increased risk of cardiac and respiratory events, including death. Patient understands. )        Anesthesia Quick Evaluation

## 2020-03-05 NOTE — Transfer of Care (Addendum)
Immediate Anesthesia Transfer of Care Note  Patient: [name] [name]  Procedure(s) Performed: CARDIOVERSION (N/A )  Patient Location: PACU  Anesthesia Type:General  Level of Consciousness: awake, alert  and oriented  Airway & Oxygen Therapy: Patient Spontanous Breathing and Patient connected to nasal cannula oxygen  Post-op Assessment: Report given to RN and Post -op Vital signs reviewed and stable  Post vital signs: Reviewed and stable  Last Vitals:  Vitals Value Taken Time  BP 135/63 03/05/20 0758  Temp    Pulse 47 03/05/20 0755  Resp 16 03/05/20 0758  SpO2 99 % 03/05/20 0755  Vitals shown include unvalidated device data.  Last Pain:  Vitals:   03/05/20 0755  TempSrc:   PainSc: 0-No pain         Complications: No apparent anesthesia complications

## 2020-03-06 ENCOUNTER — Telehealth: Payer: Self-pay | Admitting: *Deleted

## 2020-03-06 NOTE — Anesthesia Postprocedure Evaluation (Signed)
Anesthesia Post Note  Patient: [name] [name]  Procedure(s) Performed: CARDIOVERSION (N/A )  Patient location during evaluation: Specials Recovery Anesthesia Type: General Level of consciousness: awake and alert Pain management: pain level controlled Vital Signs Assessment: post-procedure vital signs reviewed and stable Respiratory status: spontaneous breathing, nonlabored ventilation, respiratory function stable and patient connected to nasal cannula oxygen Cardiovascular status: blood pressure returned to baseline and stable Postop Assessment: no apparent nausea or vomiting Anesthetic complications: no     Last Vitals:  Vitals:   03/05/20 0755 03/05/20 0800  BP: 118/68 135/63  Pulse: (!) 47   Resp: 16 16  Temp:    SpO2: 99%     Last Pain:  Vitals:   03/05/20 0755  TempSrc:   PainSc: 0-No pain                 Arita Miss

## 2020-03-06 NOTE — Telephone Encounter (Signed)
Left detailed voicemail message with contact information for [place] and number [phone] and to please call us back if he has any further questions.

## 2020-03-06 NOTE — Telephone Encounter (Signed)
-----   Message from Minna Merritts, MD sent at 03/05/2020  7:43 AM EST ----- Can you call him with contact info for [facility] , nephrology Thx TG

## 2020-03-26 DIAGNOSIS — N184 Chronic kidney disease, stage 4 (severe): Secondary | ICD-10-CM | POA: Diagnosis not present

## 2020-03-26 DIAGNOSIS — I255 Ischemic cardiomyopathy: Secondary | ICD-10-CM | POA: Diagnosis not present

## 2020-03-26 DIAGNOSIS — I129 Hypertensive chronic kidney disease with stage 1 through stage 4 chronic kidney disease, or unspecified chronic kidney disease: Secondary | ICD-10-CM | POA: Diagnosis not present

## 2020-03-26 DIAGNOSIS — E875 Hyperkalemia: Secondary | ICD-10-CM | POA: Diagnosis not present

## 2020-03-28 NOTE — Progress Notes (Signed)
Cardiology Office Note  Date:  03/30/2020   ID:  [name] [name], DOB 01/06/1931, MRN [id number]  PCP:  [name], MD   Chief Complaint  Patient presents with  . office visit    Pt states still has Hx of SOB. Meds verbally reviewed w/ pt.    HPI:  [name] [name] is a pleasant 86 y.o. gentleman with a history of  Smoking, stopped in 1969 coronary artery disease,  prior stenting to his mid LAD,  in 2006, anterior ST elevation MI December 2008 secondary to ISR hypertension,  hyperlipidemia,  chronic kidney disease stage III admitted to the hospital October 21, 2011 with chest pain cardiac cardiac catheterization  with PCI of the OM 2. He presents today for follow-up of his coronary artery disease  While in atrial fibrillation ejection fraction 25% Underwent cardioversion March 05, 2020 Normal sinus rhythm restored Previously seen by Dr. Caryl Comes Currently on Xarelto No reevaluation of his ejection fraction since back in normal sinus rhythm  Rarely takes torsemide once a week, sometimes 2x a week Rarely gets leg edema Chronic mild stable shortness of breath No regular exercise program  [name] work reviewed with him CR 2.1 labs reviewed 01/01/2020 Followed by nephrology They report he has stage IV  EKG personally reviewed by myself on todays visit NSR rate 52 LBBB  Other past medical history reviewed Presented to the ED on 03/06/2019 after seeing Dr. Ramonita [name] and his troponin [name] showed 0.03. Repeat again 0.03 The ED did not find anything of note  And he was discharged home  For further work-up, he had a stress test on 03/08/2019 This showed predominantly fixed defects in regions of old MI  Results discussed with him in detail today  Ejection fraction 34%   echocardiogram  [employer] ejection fraction 35% presumably but not specifically detailed.  Moderate AI   OTHER PAST MEDICAL HISTORY REVIEWED BY ME FOR TODAY'S VISIT: Left total knee replacement surgery in July 2019.    Hospital admission 08/04/2015 for GI discomfort, chest pain Negative cardiac enzymes,  Acute on chronic bronchitis, treated with ABX  Cough with ACE inhibitors  Echocardiogram 08/04/2015 showing ejection fraction 45-50% with apical hypokinesis  Stress test at the end of August 2016 showing fixed defect, no ischemia, old anterior MI   chronic kidney disease, stage III, asymptomatic bradycardia, obesity, right hip replacement 2008, thrombocytopenia, anterior ST elevation MI December 2008 secondary to stent thrombosis in the setting of stopping Plavix treated with bare-metal stenting. Prior DES stent to the LAD in 2006  Prior stress test 01/10/2013 that showed Old scar and the mid to distal anterior wall, apical region.  Previous creatinine 1.9.  Prior cardiac catheterization October 2012 showed left main shows no disease, LAD has 30% proximal disease, 2 overlapping stents in the mid region, 95% ostial diagonal disease jailed by the stents, 95% proximal OM 2 disease, mild plaque in the RCA, hypokinesis of the anterolateral wall, apical region and inferoapical wall with ejection fraction 30-35%.  [name] work from June 2014 shows total cholesterol 94, LDL 42, HDL 28 [name] work December 2014 shows total cholesterol 120  Echocardiogram October 20, 2011 shows ejection fraction 45%, apical hypokinesis, mild LVH, mildly dilated left atrium   PMH:   has a past medical history of Arthritis, Asymptomatic Sinus bradycardia, CAD (coronary artery disease), CKD (chronic kidney disease), stage III, HFrEF (heart failure with reduced ejection fraction) ([place]), Hyperlipidemia, Hypertension, Ischemic cardiomyopathy, LBBB (left bundle branch block), Myocardial infarction ([place]), Persistent  atrial fibrillation ([place]), and Tobacco user.  PSH:    Past Surgical History:  Procedure Laterality Date  . CARDIAC CATHETERIZATION  10/21/2011   s/p stent placement  . CARDIAC CATHETERIZATION  2008   stent placement   . CARDIAC CATHETERIZATION  2006   stent placement  . CARDIOVERSION N/A 03/05/2020   Procedure: CARDIOVERSION;  Surgeon: Minna Merritts, MD;  Location: [facility] ORS;  Service: Cardiovascular;  Laterality: N/A;  . CARPAL TUNNEL RELEASE Right 07/22/2016   Procedure: CARPAL TUNNEL RELEASE;  Surgeon: Leanor Kail, MD;  Location: [facility] ORS;  Service: Orthopedics;  Laterality: Right;  . JOINT REPLACEMENT    . LEFT HEART CATH AND CORONARY ANGIOGRAPHY N/A 01/20/2020   Procedure: LEFT HEART CATH AND CORONARY ANGIOGRAPHY;  Surgeon: Jettie Booze, MD;  Location: [place] [name];  Service: Cardiovascular;  Laterality: N/A;  . TOTAL HIP ARTHROPLASTY     Right  . TOTAL KNEE ARTHROPLASTY Left 06/29/2018   Procedure: LEFT TOTAL KNEE ARTHROPLASTY;  Surgeon: Frederik Pear, MD;  Location: [place] [facility];  Service: Orthopedics;  Laterality: Left;  . TRANSURETHRAL RESECTION OF PROSTATE      Current Outpatient Medications  Medication Sig Dispense Refill  . carvedilol (COREG) 6.25 MG tablet Take 1 tablet (6.25 mg total) by mouth 2 (two) times daily with a meal. 180 tablet 3  . lovastatin (MEVACOR) 40 MG tablet Take 40 mg by mouth every evening.     . Multiple Vitamin (MULTIVITAMIN WITH MINERALS) TABS tablet Take 2 tablets by mouth daily.    . nitroGLYCERIN (NITROSTAT) 0.4 MG SL tablet Place 0.4 mg under the tongue every 5 (five) minutes as needed for chest pain.    . Probiotic CAPS Take 1 capsule by mouth daily.    . Rivaroxaban (XARELTO) 15 MG TABS tablet Take 15 mg by mouth daily with supper.    . torsemide (DEMADEX) 20 MG tablet Take 1 tablet (20 mg total) by mouth daily as needed (weight gain > 3 lbs overnight). 90 tablet 3  . triamcinolone cream (KENALOG) 0.1 % Apply 1 application topically daily as needed (itching).     No current facility-administered medications for this visit.     Allergies:   Penicillins   Social History:  The patient  reports that he quit smoking about 51 years ago. His smoking use  included cigarettes. He has a 20.00 pack-year smoking history. He has never used smokeless tobacco. He reports that he does not drink alcohol or use drugs.   Family History:   Family history is unknown by patient.   Review of Systems: Review of Systems  Constitutional: Negative.   HENT: Negative.   Eyes: Negative.   Respiratory: Negative.  Shortness of breath: exertion.   Cardiovascular: Negative.   Gastrointestinal: Negative.   Genitourinary: Negative.   Musculoskeletal: Negative.   Neurological: Negative.   Psychiatric/Behavioral: Negative.   All other systems reviewed and are negative.   PHYSICAL EXAM: VS:  BP (!) 130/56 (BP Location: Left Arm, Patient Position: Sitting, Cuff Size: Normal)   Pulse (!) 52   Ht 5\' 6"  (1.676 m)   Wt 211 lb (95.7 kg)   SpO2 95%   BMI 34.06 kg/m  , BMI Body mass index is 34.06 kg/m.  Constitutional:  oriented to person, place, and time. No distress.  HENT:  Head: Grossly normal Eyes:  no discharge. No scleral icterus.  Neck: No JVD, no carotid bruits  Cardiovascular: Irregularly irregular, no murmurs appreciated Trace lower extremity edema bilaterally Pulmonary/Chest: Clear  to auscultation bilaterally, no wheezes or rails Abdominal: Soft.  no distension.  no tenderness.  Musculoskeletal: Normal range of motion Neurological:  normal muscle tone. Coordination normal. No atrophy Skin: Skin warm and dry Psychiatric: normal affect, pleasant  Recent Labs: 01/20/2020: ALT 27 01/21/2020: Magnesium 1.8 02/26/2020: Hemoglobin 14.9; Platelets 162 02/28/2020: BUN 55; Creatinine, Ser 2.70; Potassium 4.7; Sodium 136    Lipid Panel [name] Results  Component Value Date   CHOL 110 01/20/2020   HDL 26 (L) 01/20/2020   LDLCALC 53 01/20/2020   TRIG 153 (H) 01/20/2020    Wt Readings from Last 3 Encounters:  03/30/20 211 lb (95.7 kg)  03/05/20 212 lb (96.2 kg)  02/26/20 213 lb (96.6 kg)     ASSESSMENT AND PLAN:  Atrial fib, permanent On carvedilol,  warfarin Maintaining normal sinus rhythm Asymptomatic bradycardia on today's visit  Atherosclerosis of native coronary artery with stable angina pectoris,  native(HCC)  Prior stress test Regions of fixed defect consistent with prior MI Denies having any unstable angina symptoms No further ischemic work-up at this time  Hypertension Blood pressure is well controlled on today's visit. No changes made to the medications.  Cardiomyopathy In the setting of atrial fibrillation early 2021, Suspect depressed ejection fraction from arrhythmia Limited echocardiogram ordered now that he is back to normal sinus rhythm  Mixed hyperlipidemia  Goal LDL less than 70 Numbers at goal  Chronic renal failure, stage 3 (moderate)  Avoid NSAIDs, slow rising creatinine Will avoid excess diuresis 1 or 2 weeks seems to work well for him  Shortness of breath Chronic issue from CHF, exacerbated by atrial fibrillation deconditioning Recommended weight loss, walking program   total encounter time more than 25 minutes  Greater than 50% was spent in counseling and coordination of care with the patient  Disposition:   F/U 6 months   Orders Placed This Encounter  Procedures  . EKG 12-Lead  . ECHOCARDIOGRAM LIMITED   Signed, Esmond Plants, M.D., Ph.D. 03/30/2020  [place], [place]

## 2020-03-30 ENCOUNTER — Other Ambulatory Visit: Payer: Self-pay

## 2020-03-30 ENCOUNTER — Encounter: Payer: Self-pay | Admitting: Cardiovascular Disease

## 2020-03-30 ENCOUNTER — Ambulatory Visit (INDEPENDENT_AMBULATORY_CARE_PROVIDER_SITE_OTHER): Payer: PPO | Admitting: Cardiovascular Disease

## 2020-03-30 VITALS — BP 130/56 | HR 52 | Ht 66.0 in | Wt 211.0 lb

## 2020-03-30 DIAGNOSIS — I255 Ischemic cardiomyopathy: Secondary | ICD-10-CM | POA: Diagnosis not present

## 2020-03-30 DIAGNOSIS — I10 Essential (primary) hypertension: Secondary | ICD-10-CM

## 2020-03-30 DIAGNOSIS — I739 Peripheral vascular disease, unspecified: Secondary | ICD-10-CM | POA: Diagnosis not present

## 2020-03-30 DIAGNOSIS — I2510 Atherosclerotic heart disease of native coronary artery without angina pectoris: Secondary | ICD-10-CM | POA: Diagnosis not present

## 2020-03-30 DIAGNOSIS — I4819 Other persistent atrial fibrillation: Secondary | ICD-10-CM | POA: Diagnosis not present

## 2020-03-30 DIAGNOSIS — E785 Hyperlipidemia, unspecified: Secondary | ICD-10-CM

## 2020-03-30 DIAGNOSIS — N183 Chronic kidney disease, stage 3 unspecified: Secondary | ICD-10-CM

## 2020-03-30 NOTE — Patient Instructions (Addendum)
Medication Instructions:  No changes  If you need a refill on your cardiac medications before your next appointment, please call your pharmacy.    Lab work: No new labs needed   If you have labs (blood work) drawn today and your tests are completely normal, you will receive your results only by: Marland Kitchen MyChart Message (if you have MyChart) OR . A paper copy in the mail If you have any lab test that is abnormal or we need to change your treatment, we will call you to review the results.   Testing/Procedures: Limited echo, atrial fib Cardiomyopathy  LE arterial doppler, with ABI   Follow-Up: At [facility], you and your health needs are our priority.  As part of our continuing mission to provide you with exceptional heart care, we have created designated Provider Care Teams.  These Care Teams include your primary Cardiologist (physician) and Advanced Practice Providers (APPs -  Physician Assistants and Nurse Practitioners) who all work together to provide you with the care you need, when you need it.  . You will need a follow up appointment in 6 months   . Providers on your designated Care Team:   . Murray Hodgkins, NP . Christell Faith, PA-C . Marrianne Mood, PA-C  Any Other Special Instructions Will Be Listed Below (If Applicable).  For educational health videos Log in to : [email or web address] Or : [email or web address], password : [email or web address]

## 2020-03-31 ENCOUNTER — Other Ambulatory Visit: Payer: Self-pay | Admitting: Nephrology

## 2020-03-31 DIAGNOSIS — D696 Thrombocytopenia, unspecified: Secondary | ICD-10-CM | POA: Diagnosis not present

## 2020-03-31 DIAGNOSIS — N184 Chronic kidney disease, stage 4 (severe): Secondary | ICD-10-CM

## 2020-03-31 DIAGNOSIS — I255 Ischemic cardiomyopathy: Secondary | ICD-10-CM

## 2020-03-31 DIAGNOSIS — I129 Hypertensive chronic kidney disease with stage 1 through stage 4 chronic kidney disease, or unspecified chronic kidney disease: Secondary | ICD-10-CM

## 2020-03-31 DIAGNOSIS — I10 Essential (primary) hypertension: Secondary | ICD-10-CM | POA: Diagnosis not present

## 2020-03-31 DIAGNOSIS — E538 Deficiency of other specified B group vitamins: Secondary | ICD-10-CM | POA: Diagnosis not present

## 2020-03-31 DIAGNOSIS — E7849 Other hyperlipidemia: Secondary | ICD-10-CM | POA: Diagnosis not present

## 2020-03-31 DIAGNOSIS — R739 Hyperglycemia, unspecified: Secondary | ICD-10-CM | POA: Diagnosis not present

## 2020-03-31 DIAGNOSIS — E875 Hyperkalemia: Secondary | ICD-10-CM

## 2020-04-03 ENCOUNTER — Ambulatory Visit
Admission: RE | Admit: 2020-04-03 | Discharge: 2020-04-03 | Disposition: A | Discharge status: Home / Self Care | Payer: PPO | Source: Ambulatory Visit | Attending: Nephrology | Admitting: Nephrology

## 2020-04-03 DIAGNOSIS — E875 Hyperkalemia: Secondary | ICD-10-CM

## 2020-04-03 DIAGNOSIS — I255 Ischemic cardiomyopathy: Secondary | ICD-10-CM

## 2020-04-03 DIAGNOSIS — I129 Hypertensive chronic kidney disease with stage 1 through stage 4 chronic kidney disease, or unspecified chronic kidney disease: Secondary | ICD-10-CM

## 2020-04-03 DIAGNOSIS — N184 Chronic kidney disease, stage 4 (severe): Secondary | ICD-10-CM

## 2020-04-03 DIAGNOSIS — N281 Cyst of kidney, acquired: Secondary | ICD-10-CM | POA: Diagnosis not present

## 2020-04-06 ENCOUNTER — Ambulatory Visit (INDEPENDENT_AMBULATORY_CARE_PROVIDER_SITE_OTHER): Payer: PPO

## 2020-04-06 ENCOUNTER — Other Ambulatory Visit: Payer: Self-pay

## 2020-04-06 DIAGNOSIS — I4819 Other persistent atrial fibrillation: Secondary | ICD-10-CM | POA: Diagnosis not present

## 2020-04-06 DIAGNOSIS — I255 Ischemic cardiomyopathy: Secondary | ICD-10-CM

## 2020-04-06 DIAGNOSIS — I739 Peripheral vascular disease, unspecified: Secondary | ICD-10-CM

## 2020-04-06 MED ORDER — PERFLUTREN LIPID MICROSPHERE
1.0000 mL | INTRAVENOUS | Status: AC | PRN
  Administered 2020-04-06: 2 mL via INTRAVENOUS

## 2020-04-07 DIAGNOSIS — I480 Paroxysmal atrial fibrillation: Secondary | ICD-10-CM | POA: Diagnosis not present

## 2020-04-07 DIAGNOSIS — N183 Chronic kidney disease, stage 3 unspecified: Secondary | ICD-10-CM | POA: Diagnosis not present

## 2020-04-07 DIAGNOSIS — I10 Essential (primary) hypertension: Secondary | ICD-10-CM | POA: Diagnosis not present

## 2020-04-07 DIAGNOSIS — I5022 Chronic systolic (congestive) heart failure: Secondary | ICD-10-CM | POA: Diagnosis not present

## 2020-04-07 DIAGNOSIS — E559 Vitamin D deficiency, unspecified: Secondary | ICD-10-CM | POA: Diagnosis not present

## 2020-04-07 DIAGNOSIS — I25118 Atherosclerotic heart disease of native coronary artery with other forms of angina pectoris: Secondary | ICD-10-CM | POA: Diagnosis not present

## 2020-04-07 DIAGNOSIS — I77811 Abdominal aortic ectasia: Secondary | ICD-10-CM | POA: Diagnosis not present

## 2020-04-07 DIAGNOSIS — R739 Hyperglycemia, unspecified: Secondary | ICD-10-CM | POA: Diagnosis not present

## 2020-04-07 DIAGNOSIS — D696 Thrombocytopenia, unspecified: Secondary | ICD-10-CM | POA: Diagnosis not present

## 2020-04-07 DIAGNOSIS — E538 Deficiency of other specified B group vitamins: Secondary | ICD-10-CM | POA: Diagnosis not present

## 2020-04-07 DIAGNOSIS — E7849 Other hyperlipidemia: Secondary | ICD-10-CM | POA: Diagnosis not present

## 2020-04-07 DIAGNOSIS — D692 Other nonthrombocytopenic purpura: Secondary | ICD-10-CM | POA: Diagnosis not present

## 2020-04-10 ENCOUNTER — Telehealth: Payer: Self-pay

## 2020-04-10 NOTE — Telephone Encounter (Signed)
Call to patient to review vascular ultrasound.    Pt verbalized understanding and has no further questions at this time.    Advised pt to call for any further questions or concerns.  No further orders.

## 2020-04-10 NOTE — Telephone Encounter (Signed)
-----   Message from Theora Gianotti, NP sent at 04/09/2020  5:22 PM EDT ----- No suggestion of significant blockage on peripheral vascular studies.  Reassuring!

## 2020-04-13 DIAGNOSIS — L308 Other specified dermatitis: Secondary | ICD-10-CM | POA: Diagnosis not present

## 2020-04-13 DIAGNOSIS — L820 Inflamed seborrheic keratosis: Secondary | ICD-10-CM | POA: Diagnosis not present

## 2020-04-14 ENCOUNTER — Telehealth: Payer: Self-pay | Admitting: *Deleted

## 2020-04-14 NOTE — Telephone Encounter (Signed)
-----   Message from Minna Merritts, MD sent at 04/12/2020 12:22 PM EDT ----- Echo Ejection fraction remains low, 25 to 30% Previously 20 to 25% Medications are limited, will need to avoid entresto/ace/arb/spironolactone secondary to renal dysfunction -options include no medication changes at this time -or add small amounts of imdur 15 mg daily to start Or very low dose hydralazine TID/or bidil TID --we did ischemia workup/stress test 1 year ago and on recent office visit, did not have angina/ renal dysfunction limiting cardiac cath

## 2020-04-14 NOTE — Telephone Encounter (Signed)
Spoke with patient and reviewed appointment change which he was agreeable with. He had no other questions at this time

## 2020-04-14 NOTE — Telephone Encounter (Signed)
Reviewed results with patient and discussed limitations on medications. Inquired if he would like to come in and see provider to review those options and he agreed with this. Scheduled him to come in tomorrow to see provider and requested he arrive early due to arrival at the [facility] entrance. He verbalized understanding of our conversation, confirmed appointment, and he had no further questions at this time.

## 2020-04-14 NOTE — Telephone Encounter (Signed)
Left voicemail message for patient to call back.

## 2020-04-15 ENCOUNTER — Ambulatory Visit (INDEPENDENT_AMBULATORY_CARE_PROVIDER_SITE_OTHER): Payer: PPO | Admitting: Cardiovascular Disease

## 2020-04-15 ENCOUNTER — Encounter: Payer: Self-pay | Admitting: Cardiovascular Disease

## 2020-04-15 ENCOUNTER — Other Ambulatory Visit: Payer: Self-pay

## 2020-04-15 VITALS — BP 118/64 | HR 82 | Ht 67.0 in | Wt 214.2 lb

## 2020-04-15 DIAGNOSIS — N183 Chronic kidney disease, stage 3 unspecified: Secondary | ICD-10-CM

## 2020-04-15 DIAGNOSIS — I25118 Atherosclerotic heart disease of native coronary artery with other forms of angina pectoris: Secondary | ICD-10-CM | POA: Diagnosis not present

## 2020-04-15 DIAGNOSIS — I10 Essential (primary) hypertension: Secondary | ICD-10-CM | POA: Diagnosis not present

## 2020-04-15 DIAGNOSIS — I255 Ischemic cardiomyopathy: Secondary | ICD-10-CM

## 2020-04-15 DIAGNOSIS — I4819 Other persistent atrial fibrillation: Secondary | ICD-10-CM | POA: Diagnosis not present

## 2020-04-15 MED ORDER — HYDRALAZINE HCL 10 MG PO TABS: 10.0000 mg | ORAL_TABLET | Freq: Three times a day (TID) | ORAL | 6 refills | Status: DC

## 2020-04-15 MED ORDER — TORSEMIDE 20 MG PO TABS: ORAL_TABLET | ORAL | 3 refills | Status: DC

## 2020-04-15 MED ORDER — ISOSORBIDE MONONITRATE ER 30 MG PO TB24: 15.0000 mg | ORAL_TABLET | Freq: Every day | ORAL | 6 refills | Status: DC

## 2020-04-15 NOTE — Patient Instructions (Addendum)
Ask Kidney doctor if we can start an ACE, ARB or entresto  Medication Instructions: - Your physician has recommended you make the following change in your medication:   1) Start imdur (isosorbide) 30 mg- take 0.5 tablet (15 mg) once daily  2) Start hydralazine 10 mg- take 1 tablet (10 mg) three times a day  3) Increase Torsemide 20 mg - take 1 tablet (20 mg) once every other day (Monday/wednesday/Friday)   If you need a refill on your cardiac medications before your next appointment, please call your pharmacy.    Lab work: No new labs needed   If you have labs (blood work) drawn today and your tests are completely normal, you will receive your results only by: Marland Kitchen MyChart Message (if you have MyChart) OR . A paper copy in the mail If you have any lab test that is abnormal or we need to change your treatment, we will call you to review the results.   Testing/Procedures: No new testing needed   Follow-Up: At [facility], you and your health needs are our priority.  As part of our continuing mission to provide you with exceptional heart care, we have created designated Provider Care Teams.  These Care Teams include your primary Cardiologist (physician) and Advanced Practice Providers (APPs -  Physician Assistants and Nurse Practitioners) who all work together to provide you with the care you need, when you need it.  . You will need a follow up appointment in 6 weeks  . Providers on your designated Care Team:   . Murray Hodgkins, NP . Christell Faith, PA-C . Marrianne Mood, PA-C  Any Other Special Instructions Will Be Listed Below (If Applicable).  COVID-19 Vaccine Information can be found at: ShippingScam.co.uk For questions related to vaccine distribution or appointments, please email [email or web address] or call [phone].

## 2020-04-15 NOTE — Progress Notes (Signed)
Cardiology Office Note  Date:  04/15/2020   ID:  [name] [name], DOB [birth year], MRN [id number]  PCP:  [name], MD   Chief Complaint  Patient presents with  . OTHER    F/u echo and discuss medication options c/o rash, lovastatin taking only weekly. Meds reviewed verbally with pt.    HPI:  [name] [name] is a pleasant 86 y.o. gentleman with a history of  Smoking, stopped in 1969 coronary artery disease,  prior stenting to his mid LAD,  in 2006,  anterior ST elevation MI December 2008 secondary to ISR hypertension,  hyperlipidemia,  chronic kidney disease stage III admitted to the hospital October 21, 2011 with chest pain cardiac cardiac catheterization  with PCI of the OM 2.,  Ejection fraction 45 to 50% Chest pain shortness of breath January 2021, ejection fraction at that time 20 to 25%  catheterization January 20 2020 with nonobstructive disease He presents today for follow-up of his coronary artery disease, atrial fib  Seen in the hospital for chest pain shortness of breath January 2021, Cath 12/2019  nonobstructive disease Ejection fraction 20 to 25%, Noted to be in atrial fibrillation  Cardioversion 3/11 as outpatient Repeat echocardiogram April 06, 2020 still ejection fraction 25-30 % He was in normal sinus rhythm rates in the high 40s at the time of his echo  Had to drill a new well, stress  Todays visit back in atrial fibrillation unfortunately Continues to have shortness of breath symptoms May have been doing better normal sinus rhythm played more golf Tried to play golf more recently gave up after 5 holes was tired  Has noticed some leg swelling, rarely taking torsemide  No regular exercise program  Lab work reviewed with him CR 2.1 labs reviewed 01/01/2020 Followed by nephrology  EKG personally reviewed by myself on todays visit Atrial fibrillation rate 82 bpm PVCs  Other past medical history reviewed Presented to the ED on 03/06/2019 after seeing  Dr. Ramonita Lab and his troponin lab showed 0.03. Repeat again 0.03 The ED did not find anything of note  And he was discharged home  For further work-up, he had a stress test on 03/08/2019 This showed predominantly fixed defects in regions of old MI  Results discussed with him in detail today  Ejection fraction 34%   echocardiogram  [facility] ejection fraction 35% presumably but not specifically detailed.  Moderate AI   OTHER PAST MEDICAL HISTORY REVIEWED BY ME FOR TODAY'S VISIT: Left total knee replacement surgery in July 2019.   Hospital admission 08/04/2015 for GI discomfort, chest pain Negative cardiac enzymes,  Acute on chronic bronchitis, treated with ABX  Cough with ACE inhibitors  Echocardiogram 08/04/2015 showing ejection fraction 45-50% with apical hypokinesis  Stress test at the end of August 2016 showing fixed defect, no ischemia, old anterior MI   chronic kidney disease, stage III, asymptomatic bradycardia, obesity, right hip replacement 2008, thrombocytopenia, anterior ST elevation MI December 2008 secondary to stent thrombosis in the setting of stopping Plavix treated with bare-metal stenting. Prior DES stent to the LAD in 2006  Prior stress test 01/10/2013 that showed Old scar and the mid to distal anterior wall, apical region.  Previous creatinine 1.9.  Prior cardiac catheterization October 2012 showed left main shows no disease, LAD has 30% proximal disease, 2 overlapping stents in the mid region, 95% ostial diagonal disease jailed by the stents, 95% proximal OM 2 disease, mild plaque in the RCA, hypokinesis of the anterolateral wall, apical region  and inferoapical wall with ejection fraction 30-35%.  Lab work from June 2014 shows total cholesterol 94, LDL 42, HDL 28 Lab work December 2014 shows total cholesterol 120  Echocardiogram October 20, 2011 shows ejection fraction 45%, apical hypokinesis, mild LVH, mildly dilated left atrium   PMH:   has a past  medical history of Arthritis, Asymptomatic Sinus bradycardia, CAD (coronary artery disease), CKD (chronic kidney disease), stage III, HFrEF (heart failure with reduced ejection fraction) ([place]), Hyperlipidemia, Hypertension, Ischemic cardiomyopathy, LBBB (left bundle branch block), Myocardial infarction [facility]), Persistent atrial fibrillation ([place]), and Tobacco user.  PSH:    Past Surgical History:  Procedure Laterality Date  . CARDIAC CATHETERIZATION  10/21/2011   s/p stent placement  . CARDIAC CATHETERIZATION  2008   stent placement  . CARDIAC CATHETERIZATION  2006   stent placement  . CARDIOVERSION N/A 03/05/2020   Procedure: CARDIOVERSION;  Surgeon: Minna Merritts, MD;  Location: [facility];  Service: Cardiovascular;  Laterality: N/A;  . CARPAL TUNNEL RELEASE Right 07/22/2016   Procedure: CARPAL TUNNEL RELEASE;  Surgeon: Leanor Kail, MD;  Location: [facility];  Service: Orthopedics;  Laterality: Right;  . JOINT REPLACEMENT    . LEFT HEART CATH AND CORONARY ANGIOGRAPHY N/A 01/20/2020   Procedure: LEFT HEART CATH AND CORONARY ANGIOGRAPHY;  Surgeon: Jettie Booze, MD;  Location: [facility];  Service: Cardiovascular;  Laterality: N/A;  . TOTAL HIP ARTHROPLASTY     Right  . TOTAL KNEE ARTHROPLASTY Left 06/29/2018   Procedure: LEFT TOTAL KNEE ARTHROPLASTY;  Surgeon: Frederik Pear, MD;  Location: [facility];  Service: Orthopedics;  Laterality: Left;  . TRANSURETHRAL RESECTION OF PROSTATE      Current Outpatient Medications  Medication Sig Dispense Refill  . carvedilol (COREG) 6.25 MG tablet Take 1 tablet (6.25 mg total) by mouth 2 (two) times daily with a meal. 180 tablet 3  . lovastatin (MEVACOR) 40 MG tablet Take 40 mg by mouth every evening. Only taking weekly @ this time.    . Multiple Vitamin (MULTIVITAMIN WITH MINERALS) TABS tablet Take 2 tablets by mouth daily.    . nitroGLYCERIN (NITROSTAT) 0.4 MG SL tablet Place 0.4 mg under the tongue every 5 (five) minutes as needed for  chest pain.    . Probiotic CAPS Take 1 capsule by mouth daily.    . Rivaroxaban (XARELTO) 15 MG TABS tablet Take 15 mg by mouth daily with supper.    . torsemide (DEMADEX) 20 MG tablet Take 1 tablet (20 mg total) by mouth daily as needed (weight gain > 3 lbs overnight). 90 tablet 3  . triamcinolone cream (KENALOG) 0.1 % Apply 1 application topically daily as needed (itching).     No current facility-administered medications for this visit.     Allergies:   Penicillins   Social History:  The patient  reports that he quit smoking about 51 years ago. His smoking use included cigarettes. He has a 20.00 pack-year smoking history. He has never used smokeless tobacco. He reports that he does not drink alcohol or use drugs.   Family History:   Family history is unknown by patient.   Review of Systems: Review of Systems  Constitutional: Negative.   HENT: Negative.   Eyes: Negative.   Respiratory: Negative.  Shortness of breath: exertion.   Cardiovascular: Negative.   Gastrointestinal: Negative.   Genitourinary: Negative.   Musculoskeletal: Negative.   Neurological: Negative.   Psychiatric/Behavioral: Negative.   All other systems reviewed and are negative.   PHYSICAL EXAM:  VS:  BP 118/64 (BP Location: Right Arm, Patient Position: Sitting, Cuff Size: Normal)   Pulse 82   Ht 5\' 7"  (1.702 m)   Wt 214 lb 4 oz (97.2 kg)   SpO2 98%   BMI 33.56 kg/m  , BMI Body mass index is 33.56 kg/m.  Constitutional:  oriented to person, place, and time. No distress.  HENT:  Head: Grossly normal Eyes:  no discharge. No scleral icterus.  Neck: No JVD, no carotid bruits  Cardiovascular: Irregularly irregular, no murmurs appreciated Trace lower extremity edema bilaterally Pulmonary/Chest: Clear to auscultation bilaterally, no wheezes or rails Abdominal: Soft.  no distension.  no tenderness.  Musculoskeletal: Normal range of motion Neurological:  normal muscle tone. Coordination normal. No  atrophy Skin: Skin warm and dry Psychiatric: normal affect, pleasant  Recent Labs: 01/20/2020: ALT 27 01/21/2020: Magnesium 1.8 02/26/2020: Hemoglobin 14.9; Platelets 162 02/28/2020: BUN 55; Creatinine, Ser 2.70; Potassium 4.7; Sodium 136    Lipid Panel Lab Results  Component Value Date   CHOL 110 01/20/2020   HDL 26 (L) 01/20/2020   LDLCALC 53 01/20/2020   TRIG 153 (H) 01/20/2020    Wt Readings from Last 3 Encounters:  04/15/20 214 lb 4 oz (97.2 kg)  03/30/20 211 lb (95.7 kg)  03/05/20 212 lb (96.2 kg)     ASSESSMENT AND PLAN:  Atrial fib, persistent On carvedilol, warfarin Several weeks ago was normal sinus rhythm after cardioversion, has converted back to atrial fibrillation --Likely contributing to some of his shortness of breath in addition to his cardiomyopathy --Long discussion concerning when to restore normal sinus rhythm, would likely need antiarrhythmic --Discussed amiodarone loading, will work on his cardiomyopathy first, followed by start of antiarrhythmic/attempt to restore normal sinus rhythm in follow-up  Atherosclerosis of native coronary artery with stable angina pectoris,  native(HCC)  Recent catheterization results reviewed with him, nonischemic cardiomyopathy Plan as below  Hypertension Medication changes as below  Cardiomyopathy Markedly depressed ejection fraction 20 to 25% in January now 25 to 30% in sinus rhythm Back to atrial fibrillation today's visit We will start Imdur 15 mg daily, hydralazine 10 mg 3 times daily We will discuss with nephrology whether he is a candidate for low-dose ARB/Entresto -Given lower extremity edema, abdominal bloating recommend he start torsemide every other day  Mixed hyperlipidemia  Goal LDL less than 70 Numbers at goal  Chronic renal failure, stage 3 (moderate)  Followed by nephrology  Shortness of breath Likely multifactorial including atrial fibrillation, cardiomyopathy, underlying coronary disease/stable  angina and deconditioning  Long discussion with him concerning recurrence of his atrial fibrillation Discussed antiarrhythmics, discussed medications for cardiomyopathy  total encounter time more than 45 minutes  Greater than 50% was spent in counseling and coordination of care with the patient  Disposition:   F/U 6 weeks   No orders of the defined types were placed in this encounter.  Signed, Esmond Plants, M.D., Ph.D. 04/15/2020  [place], [place]

## 2020-04-27 DIAGNOSIS — N184 Chronic kidney disease, stage 4 (severe): Secondary | ICD-10-CM | POA: Diagnosis not present

## 2020-05-20 ENCOUNTER — Telehealth: Payer: Self-pay | Admitting: Cardiovascular Disease

## 2020-05-20 MED ORDER — ISOSORBIDE MONONITRATE ER 30 MG PO TB24: 15.0000 mg | ORAL_TABLET | Freq: Every day | ORAL | 1 refills | Status: DC

## 2020-05-20 MED ORDER — HYDRALAZINE HCL 10 MG PO TABS: 10.0000 mg | ORAL_TABLET | Freq: Three times a day (TID) | ORAL | 1 refills | Status: DC

## 2020-05-20 NOTE — Telephone Encounter (Signed)
Spoke with the patient. Patient sts that he does not have a question about his medications.. He needs an Rx sent in for 90 days supply for the Imdur and Hydralazine. 30 days were sent in previously and the 30 day and 90 days cost the same dollar amount.  Adv the patient that I will send in 90 day supplies of both medications to [facility]. Patient voiced appreciation.

## 2020-05-20 NOTE — Telephone Encounter (Signed)
Please call to discuss Isosorbide and Hydralazine . He is not sure if he still needs to be taking them.

## 2020-05-27 ENCOUNTER — Ambulatory Visit: Payer: PPO | Admitting: Physician Assistant

## 2020-05-28 DIAGNOSIS — E875 Hyperkalemia: Secondary | ICD-10-CM | POA: Diagnosis not present

## 2020-05-28 DIAGNOSIS — I129 Hypertensive chronic kidney disease with stage 1 through stage 4 chronic kidney disease, or unspecified chronic kidney disease: Secondary | ICD-10-CM | POA: Diagnosis not present

## 2020-05-28 DIAGNOSIS — N184 Chronic kidney disease, stage 4 (severe): Secondary | ICD-10-CM | POA: Diagnosis not present

## 2020-05-28 DIAGNOSIS — I255 Ischemic cardiomyopathy: Secondary | ICD-10-CM | POA: Diagnosis not present

## 2020-05-29 ENCOUNTER — Encounter: Payer: Self-pay | Admitting: Physician Assistant

## 2020-05-29 ENCOUNTER — Other Ambulatory Visit: Payer: Self-pay

## 2020-05-29 ENCOUNTER — Ambulatory Visit (INDEPENDENT_AMBULATORY_CARE_PROVIDER_SITE_OTHER): Payer: PPO | Admitting: Physician Assistant

## 2020-05-29 VITALS — BP 124/70 | HR 82 | Ht 66.0 in | Wt 217.2 lb

## 2020-05-29 DIAGNOSIS — Z862 Personal history of diseases of the blood and blood-forming organs and certain disorders involving the immune mechanism: Secondary | ICD-10-CM | POA: Diagnosis not present

## 2020-05-29 DIAGNOSIS — I10 Essential (primary) hypertension: Secondary | ICD-10-CM | POA: Diagnosis not present

## 2020-05-29 DIAGNOSIS — I25118 Atherosclerotic heart disease of native coronary artery with other forms of angina pectoris: Secondary | ICD-10-CM | POA: Diagnosis not present

## 2020-05-29 DIAGNOSIS — E785 Hyperlipidemia, unspecified: Secondary | ICD-10-CM

## 2020-05-29 DIAGNOSIS — I447 Left bundle-branch block, unspecified: Secondary | ICD-10-CM

## 2020-05-29 DIAGNOSIS — I4821 Permanent atrial fibrillation: Secondary | ICD-10-CM | POA: Diagnosis not present

## 2020-05-29 DIAGNOSIS — N281 Cyst of kidney, acquired: Secondary | ICD-10-CM | POA: Diagnosis not present

## 2020-05-29 DIAGNOSIS — R0600 Dyspnea, unspecified: Secondary | ICD-10-CM | POA: Diagnosis not present

## 2020-05-29 DIAGNOSIS — R233 Spontaneous ecchymoses: Secondary | ICD-10-CM | POA: Diagnosis not present

## 2020-05-29 DIAGNOSIS — Z8679 Personal history of other diseases of the circulatory system: Secondary | ICD-10-CM

## 2020-05-29 DIAGNOSIS — I2510 Atherosclerotic heart disease of native coronary artery without angina pectoris: Secondary | ICD-10-CM | POA: Diagnosis not present

## 2020-05-29 DIAGNOSIS — I5020 Unspecified systolic (congestive) heart failure: Secondary | ICD-10-CM | POA: Diagnosis not present

## 2020-05-29 DIAGNOSIS — R0609 Other forms of dyspnea: Secondary | ICD-10-CM

## 2020-05-29 DIAGNOSIS — R7309 Other abnormal glucose: Secondary | ICD-10-CM

## 2020-05-29 DIAGNOSIS — Z8639 Personal history of other endocrine, nutritional and metabolic disease: Secondary | ICD-10-CM | POA: Diagnosis not present

## 2020-05-29 DIAGNOSIS — I080 Rheumatic disorders of both mitral and aortic valves: Secondary | ICD-10-CM

## 2020-05-29 DIAGNOSIS — N184 Chronic kidney disease, stage 4 (severe): Secondary | ICD-10-CM | POA: Diagnosis not present

## 2020-05-29 DIAGNOSIS — E781 Pure hyperglyceridemia: Secondary | ICD-10-CM

## 2020-05-29 MED ORDER — ISOSORBIDE MONONITRATE ER 30 MG PO TB24: 30.0000 mg | ORAL_TABLET | Freq: Every day | ORAL | 1 refills | Status: DC

## 2020-05-29 MED ORDER — ROSUVASTATIN CALCIUM 10 MG PO TABS: 10.0000 mg | ORAL_TABLET | Freq: Every day | ORAL | 1 refills | Status: DC

## 2020-05-29 NOTE — Patient Instructions (Signed)
Medication Instructions:  Your physician has recommended you make the following change in your medication:   INCREASE Imdur to 30 mg daily. An Rx for 90 days supply has been sent to your pharmacy.  RESUME Crestor (rosuvastatin) 10 mg daily. An Rx has been sent to your pharmacy for a 90 day supply.  *If you need a refill on your cardiac medications before your next appointment, please call your pharmacy*   Lab Work: [facility] Today  If you have labs (blood work) drawn today and your tests are completely normal, you will receive your results only by: Marland Kitchen MyChart Message (if you have MyChart) OR . A paper copy in the mail If you have any lab test that is abnormal or we need to change your treatment, we will call you to review the results.   Testing/Procedures: None ordered   Follow-Up: At [facility], you and your health needs are our priority.  As part of our continuing mission to provide you with exceptional heart care, we have created designated Provider Care Teams.  These Care Teams include your primary Cardiologist (physician) and Advanced Practice Providers (APPs -  Physician Assistants and Nurse Practitioners) who all work together to provide you with the care you need, when you need it.  We recommend signing up for the patient portal called "MyChart".  Sign up information is provided on this After Visit Summary.  MyChart is used to connect with patients for Virtual Visits (Telemedicine).  Patients are able to view lab/test results, encounter notes, upcoming appointments, etc.  Non-urgent messages can be sent to your provider as well.   To learn more about what you can do with MyChart, go to [email or web address].    Your next appointment:   4 week(s)  The format for your next appointment:   In Person  Provider:    You may see Ida Rogue, MD or one of the following Advanced Practice Providers on your designated Care Team:    Murray Hodgkins, NP  Christell Faith,  PA-C  Marrianne Mood, PA-C    Other Instructions N/A

## 2020-05-29 NOTE — Progress Notes (Signed)
Office Visit    Patient Name: [name] [name] Date of Encounter: 05/29/2020  Primary Care Provider:  [name], MD Primary Cardiologist:  [name] Rogue, MD  Chief Complaint    Chief Complaint  Patient presents with  . office visit    6 week F/U-Patient reports LE edema and concerns about possible medication side effects causing itching; Meds verbally reviewed with patient.    86 year old male with history of CAD s/p prior mLAD DES (2006), anterior STEMI  2/2 ISR (2008) treated with BMS, LHC with PCI to OM2 (2012),  hypertension, hyperlipidemia, HFrEF with EF 25 to 30%, permanent atrial fibrillation, left bundle branch block, CKD4, history of asx bradycardia, s/p L knee (2019) and R hip (2008) replacements, prior history of smoking, and who presents for follow-up of cardiomyopathy.  Past Medical History    Past Medical History:  Diagnosis Date  . Arthritis   . Asymptomatic Sinus bradycardia   . CAD (coronary artery disease)    a. 10/2005 NSTEMI/PCI: LAD 95 (2.75x33 Cypher DES); b. 2008 Ant STEMI/PCI LAD 2/2 ISR; c. 09/2011 PCI OM2; d. 02/2019 MV: Fixed mid-dist ant/apical, mid-apical inferolateral dfects w/o ischemia; e. 12/2019 Cath: LM nl, LAD 25p/m, patent stent, D2 80, LCX nl, OM2 patent stent, RCA min irregs.  . CKD (chronic kidney disease), stage III   . HFrEF (heart failure with reduced ejection fraction) ([place])    a. 12/2019 Echo: EF 20-25%, glob HK. Mid-apical septum, inf, apical AK. Mildly reduced RV fxn. Triv MR.  Marland Kitchen Hyperlipidemia   . Hypertension   . Ischemic cardiomyopathy    a. 07/2015 Echo: EF 45-50%; b. 12/2019 Echo: EF 20-25%.  Marland Kitchen LBBB (left bundle branch block)   . Myocardial infarction ([place])   . Persistent atrial fibrillation ([place])    a.  Diagnosed January 2021.  CHA2DS2VASc = 5--> xarelto.  . Tobacco user    Remote   Past Surgical History:  Procedure Laterality Date  . CARDIAC CATHETERIZATION  10/21/2011   s/p stent placement  . CARDIAC  CATHETERIZATION  2008   stent placement  . CARDIAC CATHETERIZATION  2006   stent placement  . CARDIOVERSION N/A 03/05/2020   Procedure: CARDIOVERSION;  Surgeon: [name] Merritts, MD;  Location: [facility] ORS;  Service: Cardiovascular;  Laterality: N/A;  . CARPAL TUNNEL RELEASE Right 07/22/2016   Procedure: CARPAL TUNNEL RELEASE;  Surgeon: [name] Kail, MD;  Location: [facility] OR[facility];  Service: Orthopedics;  Laterality: Right;  . JOINT REPLACEMENT    . LEFT HEART CATH AND CORONARY ANGIOGRAPHY N/A 01/20/2020   Procedure: LEFT HEART CATH AND CORONARY ANGIOGRAPHY;  Surgeon: [name] Booze, MD;  Location: [place] [facility];  Service: Cardiovascular;  Laterality: N/A;  . TOTAL HIP ARTHROPLASTY     Right  . TOTAL KNEE ARTHROPLASTY Left 06/29/2018   Procedure: LEFT TOTAL KNEE ARTHROPLASTY;  Surgeon: Frederik Pear, MD;  Location: [facility];  Service: Orthopedics;  Laterality: Left;  . TRANSURETHRAL RESECTION OF PROSTATE      Allergies  Allergies  Allergen Reactions  . Penicillins Rash    Did it involve swelling of the face/tongue/throat, SOB, or low BP? No Did it involve sudden or severe rash/hives, skin peeling, or any reaction on the inside of your mouth or nose? No Did you need to seek medical attention at a hospital or doctor's office? No When did it last happen?2-3 years If all above answers are "NO", may proceed with cephalosporin use.     History of Present Illness  [name] [name] is a 86 y.o. male with PMH as above. He has a prior history of smoking and quit in 1969. He also reportedly has a history of cough on ACEi.  He has a history of CAD s/p DES to mLAD (2006) with subsequent anterior STEMI 2/2 ISR (11/2007) and attributed to discontinuing his Plavix. This was treated with BMS. He underwent LHC with DES to OM2 (09/2011) with cath noted to show LM without dz, pLAD with 30% dz, 2 overlapping stents in the mLAD, and 95% ostial diagonal dz jailed by the stents, 95% pOM2 dz, mild RCA  plaque. 2012 Echo EF 45-50% with apical hypokinesis, mild LVH, and mild LAE.   He underwent nonischemic stress test 02/2020 and was diagnosed with Afib of unknown chronicity 01/14/2020. Warfarin was started at that time.    In 12/2019, he presented to [facility] with CP and SOB and NSTEMI. EF 20-25%. Subsequent 12/2019 LHC showed nonobstructive CAD.   Warfarin was resumed after catheterization but later changed to Xarelto by primary care.   He has a history of CKD and hyperkalemia. After discharge from his 12/2019 cath, he noted more frequent use of Lasix and was transitioned to torsemide 38m daily, which resulted in rising Cr to 3.10 along with mild hyperkalemia. Torsemide was held with follow-up Cr improved. 01/2020, ReDS vest 37%. Follow-up lab work showed K 6.9 and he was advised to hold losartan with stat BMET showing continued elevation at 5.7. He was treated with Kayexalate 327mon 02/13/20 and follow-up K normal at 4.3. Torsemide was resumed on a sliding scale basis (PRN for weight gain greater than 3 lbs in 24h).  He underwent 02/2020 DCCV as an outpatient with repeat 04/06/20 echo showing EF 25-30% when in NSR with rates in the high 40s at the time of the echo with reported asx bradycardia.   He also underwent 02/2020 peripheral vascular studies for report of LE claudication with workup showing plaque and medial calcification without segmental stenosis as well as R/L toe-brachial indices were noted to be abnormal with ankle - brachial indices within normal range. Overall, no evidence of significant LE dz was noted.   Of note, at his 02/26/20 visit he noted hematuria.   At the time of his last clinic visit with his primary cardiologist, he was back in atrial fibrillation. He reported SOB and that he felt he did better in NSR and played more golf without getting tired. He had noted some leg swelling and was rarely taking torsemide. He denied a regular exercise program. Antiarrhythmic was discussed, including  amiodarone loading; however, recommendation was first to work on cardiomyopathy on review of previous documentation. Antiarrhythmic was deferred. He was started on Imdur 1558maily and hydralazine 72m105mD. It was noted that the plan was to discuss with nephrology if he would be a candidate for low dose ARB/Entresto. Given his LEE, it was recommended he increase to torsemide every other day. He was noted to be continued on Warfarin at that time; however, on review of EMR, he was still on Xarelto.   He has established with [facility] [place] reports his primary nephrologist is Dr. GoldLyda Kalata review of available [facility]s, Dr. GoldMoshe Ciproclosest match) with patient also reporting recent recommendation to increase his torsemide to 20mg18mly and no documentation from nephrology available on my review of EMR today.  Since that time, he reports some DOE. He is monitoring his BP at home with SBP 120-130s and DBPs  60-70s. Home HR 70-80s. He denies any return of sx as before his previous interventions but does note occasional twinges of CP, lasting only seconds. He reports his nephrologist in [place] recently increased his torsemide to a daily dose, as he has noted progressive LEE and abdominal distention. Interestingly, despite his LEE and distention on exam today, he is only 4 lbs up from 02/26/20, at which time he was noted to be euvolemic on exam and BP 110/60 with HR 63. No reported orthopnea, PND, or early satiety. No presyncope or syncope. No racing HR or palpitations. He denies any s/sx of bleeding today and reports medication compliance. He does not wish to go back on Xarelto as discussed today. He does note some issues affording Xarelto with some discussion of possible transition to Eliquis if he is able to better afford this medication. Of note, he also reports a rash today over his bilateral thighs, which he was concerned may be related to his medications but is now scheduled  to see dermatology for at this time. He reports previously stopping his statin without resolution of this rash, however. He denies any further claudication. He recently increased his diuretic as above without any noticed change in sx yet.   Home Medications    Prior to Admission medications   Medication Sig Start Date End Date Taking? Authorizing Provider  carvedilol (COREG) 6.25 MG tablet Take 1 tablet (6.25 mg total) by mouth 2 (two) times daily with a meal. 02/12/20  Yes Theora Gianotti, NP  hydrALAZINE (APRESOLINE) 10 MG tablet Take 1 tablet (10 mg total) by mouth 3 (three) times daily. 05/20/20  Yes [name] Merritts, MD  isosorbide mononitrate (IMDUR) 30 MG 24 hr tablet Take 1 tablet (30 mg total) by mouth daily. 05/29/20 08/27/20 Yes Emerald Gehres D, PA-C  lovastatin (MEVACOR) 40 MG tablet Take 40 mg by mouth every evening. Only taking weekly @ this time.   Yes [provider]  Multiple Vitamin (MULTIVITAMIN WITH MINERALS) TABS tablet Take 2 tablets by mouth daily.   Yes [provider]  nitroGLYCERIN (NITROSTAT) 0.4 MG SL tablet Place 0.4 mg under the tongue every 5 (five) minutes as needed for chest pain.   Yes [provider]  Probiotic CAPS Take 1 capsule by mouth daily.   Yes [provider]  Rivaroxaban (XARELTO) 15 MG TABS tablet Take 15 mg by mouth daily with supper.   Yes [provider]  torsemide (DEMADEX) 20 MG tablet Take 1 tablet (20 mg) by mouth once every other day [facility] Wednesdays/ Fridays) Patient taking differently: Take 20 mg by mouth daily.  04/15/20  Yes [name] Merritts, MD  triamcinolone cream (KENALOG) 0.1 % Apply 1 application topically daily as needed (itching).   Yes [provider]  rosuvastatin (CRESTOR) 10 MG tablet Take 1 tablet (10 mg total) by mouth daily. 05/29/20 08/27/20  Marrianne Mood D, PA-C    Review of Systems    He denies palpitations, pnd, orthopnea, n, v, dizziness, syncope,  weight gain, or early satiety. He reports DOE, abdominal distention, LEE, and atypical CP / brief twinges of CP.   All other systems reviewed and are otherwise negative except as noted above.  Physical Exam    VS:  BP 124/70 (BP Location: Left Arm, Patient Position: Sitting, Cuff Size: Normal)   Pulse 82   Ht 5' 6"  (1.676 m)   Wt 217 lb 4 oz (98.5 kg)   SpO2 94%   BMI 35.07 kg/m  ,  BMI Body mass index is 35.07 kg/m. GEN: Well nourished, well developed, in no acute distress. HEENT: normal. Neck: Supple. JVD difficult to assess 2/2 body habitus. No carotid bruits, or masses. Cardiac: IRIR, no murmurs, rubs, or gallops. No clubbing, cyanosis, 2-3+ bilateral edema.  Radials/DP/PT 2+ and equal bilaterally.  Respiratory:  Respirations regular and unlabored, clear to auscultation bilaterally. GI: firm, nontender, distended, BS + x 4. MS: no deformity or atrophy. Skin: warm and dry, bilateral thigh rash / red petechiae. Neuro:  Strength and sensation are intact. Psych: Normal affect.  Accessory Clinical Findings    ECG personally reviewed by me today -coarse atrial fibrillation with PVCs and known left bundle branch block, ventricular rate 82 bpm, QRS 154, prolonged QTC 505 ms- no acute changes.  VITALS Reviewed today   Temp Readings from Last 3 Encounters:  03/05/20 97.9 F (36.6 C) (Oral)  01/21/20 98.6 F (37 C) (Oral)  03/06/19 98.1 F (36.7 C) (Oral)   BP Readings from Last 3 Encounters:  05/29/20 124/70  04/15/20 118/64  03/30/20 (!) 130/56   Pulse Readings from Last 3 Encounters:  05/29/20 82  04/15/20 82  03/30/20 (!) 52    Wt Readings from Last 3 Encounters:  05/29/20 217 lb 4 oz (98.5 kg)  04/15/20 214 lb 4 oz (97.2 kg)  03/30/20 211 lb (95.7 kg)     LABS  reviewed today   Lab Results  Component Value Date   WBC 7.5 02/26/2020   HGB 14.9 02/26/2020   HCT 43.6 02/26/2020   MCV 101 (H) 02/26/2020   PLT 162 02/26/2020  03/31/20  PCP LABS Plts 148, MCV  104.2, RBC 4.29, Hgb 14.6, Hct 44.7 Lab Results  Component Value Date   CREATININE 2.70 (H) 02/28/2020   BUN 55 (H) 02/28/2020   NA 136 02/28/2020   K 4.7 02/28/2020   CL 101 02/28/2020   CO2 27 02/28/2020  03/31/20 [place] [facility] - PCP Glucose 124, Na 140, CO2 32.2, BUN 49, Cr 2.6, Ca 9.3 Lab Results  Component Value Date   ALT 27 01/20/2020   AST 44 (H) 01/20/2020   ALKPHOS 39 01/20/2020   BILITOT 0.6 01/20/2020  03/31/20 PCP LABS AST 14, ALT 11, alk phos 39 Lab Results  Component Value Date   CHOL 110 01/20/2020   HDL 26 (L) 01/20/2020   LDLCALC 53 01/20/2020   TRIG 153 (H) 01/20/2020   CHOLHDL 4.2 01/20/2020    Lab Results  Component Value Date   HGBA1C 6.0 (H) 01/20/2020  03/31/20 PCP LABS Hgb A1C 6.2  Lab Results  Component Value Date   TSH 1.757 Test methodology is 3rd generation TSH 12/12/2007  03/31/20 PCP LABS TSH    2.054  STUDIES/PROCEDURES reviewed today   Vas [place] LE arterial 04/06/20 R/L: Near normal examination. Plaque and medial calcification without  segmental stenosis.   ABIs 04/06/20 R/L: Resting ankle-brachial index is within normal range. No  evidence of significant lower extremity arterial disease. The  toe-brachial index is abnormal.    Echo 04/06/2020 1. Left ventricular ejection fraction, by estimation, is 25 to 30%. The  left ventricle has severely decreased function. The left ventricle has no  regional wall motion abnormalities. The left ventricular internal cavity  size was mildly dilated. There is  severe akinesis of the left ventricular, mid-apical anteroseptal wall,  anterior segment and apical segment.  2. Right ventricular systolic function is normal. The right ventricular  size is normal. Tricuspid regurgitation signal is inadequate for assessing  PA pressure.  3. Left atrial size was mild to moderately dilated.  4. Right atrial size was mildly dilated.  5. The mitral valve is normal in structure. Mild mitral valve    regurgitation. No evidence of mitral stenosis.  6. The aortic valve is normal in structure. Aortic valve regurgitation is  mild. Mild aortic valve sclerosis is present, with no evidence of aortic  valve stenosis.  Comparison(s): Previous Echo (while in A-Fib) showed LV EF 20-25%,  moderate dilatation of LV cavity, global HK, akinesis of the mid-apical  septum, mid-apical inferior and apical myocardium.    [place] 01/20/2020  Previously placed 2nd Mrg stent (unknown type) is widely patent.  Previously placed Mid LAD stent (unknown type) is widely patent.  Prox LAD to Mid LAD lesion is 25% stenosed.  2nd Diag lesion is 80% stenosed.  LV end diastolic pressure is normal.  There is no aortic valve stenosis. Nonobstructive CAD. Continue medical therapy.  OK to restart warfarin tonight.  Resume IV heparin tonight as well 8 hours post sheath pull. INR in AM.    Assessment & Plan    HFrEF --Continues to note DOE and LEE with abdominal distention. Remains in Afib, which is likely contributing to his sx +/- volume status. Most recent Echo shows LVEF 25-30%. Past adjustment of his diuretic has been complicated by renal function and hyperkalemia as above. At last visit, Torsemide 22m daily was increased by his primary cardiologist from PRN sliding scale to Torsemide every other day. [place] for further increase to daily torsemide 262mper patient. Unfortunately, I am unavailable to find documentation of this recommendation. I will attempt to reach out to his nephrologist and get these records. Also recommended is for the patient to bring his records to his next visit with us[place]orea[place] exam today, he does have LEE and abdominal distention; however, his weight has only increased 4 lbs from that of his previously noted dry weight in March 2021.Most recent PCP 03/31/20 BMET transcribed above, and I will recheck a BMET today. Further  recommendations regarding torsemide pending nephrology records and repeat labs.  Losartan previously discontinued 2/2 hyperkalemia and he remains off this medication today. Continue to avoid ACE/ARB/ARNI.  Low sodium diet and volume intake discussed as well as compression stockings and leg elevation.    Permanent Afib --Remains in Afib with controlled ventricular rate, which may be playing a role in his sx. Continue rate control with current BB. Of note, 03/2020 TSH 2.054. He reports compliance with reduced dose Xarelto, started by his PCP. No s/sx of bleeding reported. Due to cost barriers, discussed possible transition to Eliquis and he will investigate insurance coverage while continuing Xarelto for now. He does not wish to go back on Warfarin due to dietary restrictions and INR monitoring. Given his CHA2DS2VASc score of at least  5, he will need to remain on long term OABenjaminAs noted by Dr. GoRockey Situantiarrhythmic therapy deferred in lieu of optimizing cardiomyopathy by primary cardiologist. Reassess at RTC with consideration of renal function.   CAD --Atypical CP with brief twinges reported and not similar to before his pror interventions. S/p recent NSTEMI 12/2019 and LHC did not show any significant CAD with patent LAD and OM2 stents placed. Continue Xarelto in lieu of ASA, BB, and statin therapy. No on ACE/ARB/Spiro/Entresto 2/2 renal function as below. Given atypical CP, will increase his Imdur to 3062md today for additional antianginal relief and BP support.  HTN --Recently started on Imdur and Hydralazine by his primary cardiologist. BP relatively well controlled today. Continue current antihypertensive regimen with increase of Imdur to 1m daily.  Discussed limiting volume intake and sodium at home. Continue to monitor BP at home.  Rash, bilateral anterior thighs --Bilateral red petechiae of anterior thighs with patient noting very itchy and scratching during visit. Did not improve with statin  holiday. Patient plans to follow-up with dermatology and nephrology for rash. Also considered is possible thrombocytopenia with recent labs as above as contributing to his itchy rash.  DOE --Likely multifactorial in the setting of permanent atrial fibrillation, HFrEF, mild AR/MR, HTN, known CAD, mild MR/AR, and CKD4. Consider also that thrombocytopenia could contribute to DOE, as well as edema and petechial rash. Continue current medications. Will repeat BMET as above. Recommend obtain nephrology records.  HLD, goal <70 Hypertriglyceridemia --Continue statin with LDL goal <70. Recommended restart statin, previously discontinued d/t a rash, and given this rash did not resolve when discontinued statin. Recheck of LDL and triglycerides per PCP.  If triglycerides remain elevated, or LDL no longer at goal, could consider addition of Zetia +/- Vacepa.   Bilateral LEE --Consider that LEE may be multifactorial in the setting of HFrEF, CKD4, thrombocytopenia, and dependent edema. Recommendations as above regarding titration of diuresis pending nephrology records, leg elevation, and compression stockings. Will defer to PCP regarding recheck of CBC. Recheck BMET today.   CKD4 Renal cysts with recommendation for [place]10/2021 --Continue to follow with nephrology. Previous PCP labs show Cr 2.6 with BU 49 and Co2 32.2. Recent 04/03/20 renal UKoreashowed chronic medical renal dz / atrophy. Also noted was multiple renal cysts with 1 measuring 1.6cm in the R kidney and complex in appearance. Recommendation was for repeat UKoreain 6 months (09/2020). Avoid nephrotoxins. Renally dose anticoagulation as above. DM2 control recommended with last Hgb A1C 6.0. Consider that bilateral thigh rash may be 2/2 worsening renal function with red petechiae on exam and patient reporting he feels itchy. If patient is able to get a copy of his notes from his nephrologist, this will also facilitate communication. Consider also glycemic control as  below.  DM2 --01/20/20 Hgb A1C elevated to 6.0 and 6.2 on 03/31/20. Glycemic monitoring / control recommended for risk factor modification. Defer to PCP regarding recheck / management.  History of LE claudication sx --No reported sx of claudication today. Previous vascular studies / ABIs as above without significant dz. Continue to monitor. Recommend continue statin therapy, as well as HR and BP control.   Mild MR/AR --Continue to monitor with periodic echo, as these can contribute to DOE.  Medication changes: Increase Imdur to 359mdaily given CP twinges. Further recommendations regarding diuresis / K+, if needed, pending BMET and will attempt to reach out to nephrology. Labs ordered: BMET Studies / Imaging ordered: None. As above, repeat Renal US[place]oreaecommended in 6 months (09/2020).  Future considerations: ?antiarrhythmic therapy +/- EP referral, ?Records from nephrology Disposition: RTC 1 month or sooner if needed   JaArvil ChacoPA-C 05/29/2020

## 2020-05-30 LAB — BASIC METABOLIC PANEL
BUN/Creatinine Ratio: 20 (ref 10–24)
BUN: 52 mg/dL — ABNORMAL HIGH (ref 8–27)
CO2: 25 mmol/L (ref 20–29)
Calcium: 9.1 mg/dL (ref 8.6–10.2)
Chloride: 98 mmol/L (ref 96–106)
Creatinine, Ser: 2.58 mg/dL — ABNORMAL HIGH (ref 0.76–1.27)
GFR calc Af Amer: 25 mL/min/{1.73_m2} — ABNORMAL LOW (ref >59)
GFR calc non Af Amer: 21 mL/min/{1.73_m2} — ABNORMAL LOW (ref >59)
Glucose: 120 mg/dL — ABNORMAL HIGH (ref 65–99)
Potassium: 5.2 mmol/L (ref 3.5–5.2)
Sodium: 137 mmol/L (ref 134–144)

## 2020-06-01 ENCOUNTER — Telehealth: Payer: Self-pay | Admitting: Cardiovascular Disease

## 2020-06-01 DIAGNOSIS — B078 Other viral warts: Secondary | ICD-10-CM | POA: Diagnosis not present

## 2020-06-01 DIAGNOSIS — C44629 Squamous cell carcinoma of skin of left upper limb, including shoulder: Secondary | ICD-10-CM | POA: Diagnosis not present

## 2020-06-01 DIAGNOSIS — L570 Actinic keratosis: Secondary | ICD-10-CM | POA: Diagnosis not present

## 2020-06-01 DIAGNOSIS — X32XXXD Exposure to sunlight, subsequent encounter: Secondary | ICD-10-CM | POA: Diagnosis not present

## 2020-06-01 DIAGNOSIS — L820 Inflamed seborrheic keratosis: Secondary | ICD-10-CM | POA: Diagnosis not present

## 2020-06-01 NOTE — Telephone Encounter (Signed)
Patient returning call  States that someone called and lost connection - not sure who Please call if needed

## 2020-06-01 NOTE — Telephone Encounter (Signed)
I do not see any documentation of our office trying to contact the office. No action needed at this time.

## 2020-06-03 ENCOUNTER — Telehealth: Payer: Self-pay | Admitting: *Deleted

## 2020-06-03 DIAGNOSIS — I10 Essential (primary) hypertension: Secondary | ICD-10-CM

## 2020-06-03 DIAGNOSIS — N184 Chronic kidney disease, stage 4 (severe): Secondary | ICD-10-CM

## 2020-06-03 NOTE — Telephone Encounter (Signed)
-----   Message from Arvil Chaco, PA-C sent at 06/03/2020  9:04 AM EDT ----- Please let [name] [name] know that his renal function is stable from previous visits.   His [facility] nephrologist increased his diuresis at his last visit with this physician. Unfortunately, I have been unable to reach his nephrologist to obtain documentation from this visit. We will continue to try to obtain these records. In the meantime, if he is able to bring these records (or request them sent to our office) before his next visit, it will be helpful in guiding [place] with diuresis.    Given his stable renal function, let's have him increase his torsemide 20mg  daily to torsemide 40mg  daily for three days and then drop down to his usual dose. We can recheck a BMET again in 1 week. He should let us know if any questions or concerns.  Hopefully, he is tolerating the increased dose of Imdur well.   Overall, very reassuring lab results.

## 2020-06-03 NOTE — Telephone Encounter (Signed)
Patient not home. Wife answer, ok per DPR. She wrote down the results and recommendations and read them back to me correctly regarding increase of Torsemide to 40 mg for 3 days and then back to 20 mg daily. Aware patient should go to the [facility] in 1 week for labwork. They will try to get lab work from [facility] as well if they talk to them.

## 2020-06-09 ENCOUNTER — Other Ambulatory Visit: Payer: Self-pay

## 2020-06-09 ENCOUNTER — Other Ambulatory Visit
Admission: RE | Admit: 2020-06-09 | Discharge: 2020-06-09 | Disposition: A | Discharge status: Home / Self Care | Payer: PPO | Attending: Physician Assistant | Admitting: Physician Assistant

## 2020-06-09 DIAGNOSIS — I25119 Atherosclerotic heart disease of native coronary artery with unspecified angina pectoris: Secondary | ICD-10-CM | POA: Insufficient documentation

## 2020-06-09 DIAGNOSIS — I10 Essential (primary) hypertension: Secondary | ICD-10-CM

## 2020-06-09 DIAGNOSIS — N184 Chronic kidney disease, stage 4 (severe): Secondary | ICD-10-CM | POA: Diagnosis not present

## 2020-06-09 DIAGNOSIS — R0602 Shortness of breath: Secondary | ICD-10-CM | POA: Insufficient documentation

## 2020-06-09 DIAGNOSIS — R079 Chest pain, unspecified: Secondary | ICD-10-CM | POA: Insufficient documentation

## 2020-06-09 LAB — BASIC METABOLIC PANEL
Anion gap: 13 (ref 5–15)
BUN: 65 mg/dL — ABNORMAL HIGH (ref 8–23)
CO2: 25 mmol/L (ref 22–32)
Calcium: 8.6 mg/dL — ABNORMAL LOW (ref 8.9–10.3)
Chloride: 100 mmol/L (ref 98–111)
Creatinine, Ser: 3.18 mg/dL — ABNORMAL HIGH (ref 0.61–1.24)
GFR calc Af Amer: 19 mL/min — ABNORMAL LOW (ref >60)
GFR calc non Af Amer: 17 mL/min — ABNORMAL LOW (ref >60)
Glucose, Bld: 136 mg/dL — ABNORMAL HIGH (ref 70–99)
Potassium: 4.6 mmol/L (ref 3.5–5.1)
Sodium: 138 mmol/L (ref 135–145)

## 2020-06-09 LAB — PROTIME-INR
INR: 2.4 — ABNORMAL HIGH (ref 0.8–1.2)
Prothrombin Time: 25.6 seconds — ABNORMAL HIGH (ref 11.4–15.2)

## 2020-06-12 ENCOUNTER — Telehealth: Payer: Self-pay

## 2020-06-12 NOTE — Telephone Encounter (Signed)
Attempted to call patient. [facility] 06/12/2020

## 2020-06-12 NOTE — Telephone Encounter (Signed)
Incoming call returned by patient to review labs and POC.  Pt confirmed taking 20 mg torsemide daily.  Will fax labs on Monday to [place]. Dr. Lyda Kalata Phone number [phone].   No new orders at this time.   Advised pt to call for any further questions or concerns.

## 2020-06-12 NOTE — Telephone Encounter (Signed)
Patient calling to discuss recent testing results  ° °Please call  ° °

## 2020-06-12 NOTE — Telephone Encounter (Signed)
-----   Message from Arvil Chaco, PA-C sent at 06/11/2020  4:16 PM EDT ----- Please let [name] [name] know that his renal function has bumped compared with two weeks ago. He should now be taking his previous Torsemide dose of 20mg  daily; therefore, no medication changes are needed at this time. I would recommend he ensure he has close follow-up with nephrology and our office, especially if he is still feeling volume overloaded.   I will Cc Laurann Montana, NP, on this result note, so that she is aware of these results as well, and given his next appointment is with her on 06/26/20. Again, close follow-up would be recommended if he is still feeling volume overloaded. The patient sees an outside nephrologist, and it would be helpful to have any paperwork from them as well at our next visit.

## 2020-06-12 NOTE — Telephone Encounter (Signed)
Attempted to call patient. [facility] 06/12/2020

## 2020-06-12 NOTE — Telephone Encounter (Signed)
Patient is returning your call.  

## 2020-06-16 NOTE — Telephone Encounter (Signed)
Lab results faxed to [facility] as requested.   No further orders at this time.

## 2020-06-17 NOTE — Telephone Encounter (Signed)
Incoming call from [name] [name] regarding call back from nephrologist.   BP today 129/75, HR 87.   Pt received a call from [facility] and they are not quite sure what is causing Cr to go up. Blood pressure has been normal and patient is watching his salt. They will continue to explore cause of elevated kidney function. Pt is going to return their call for appt.   I told him I would let the provider know and may need them to further discuss case between providers.   Advised pt to call for any further questions or concerns.

## 2020-06-18 DIAGNOSIS — C44629 Squamous cell carcinoma of skin of left upper limb, including shoulder: Secondary | ICD-10-CM | POA: Diagnosis not present

## 2020-06-18 DIAGNOSIS — L570 Actinic keratosis: Secondary | ICD-10-CM | POA: Diagnosis not present

## 2020-06-18 DIAGNOSIS — X32XXXD Exposure to sunlight, subsequent encounter: Secondary | ICD-10-CM | POA: Diagnosis not present

## 2020-06-18 NOTE — Telephone Encounter (Addendum)
Nephrology is managing his diuresis in the setting of declining kidney function and long history of previous inability to manage diuresis from a cardiac standpoint and in the setting of his kidney function.   Per patient, they had doubled his torsemide. He did not have follow-up labs or another visit scheduled with nephrology so I checked a BMET to help facilitate his making an appointment with nephrology to discuss changes (if needed) sooner rather than later.  I will defer to nephrology's professional recommendation regarding his renal insufficiency and managing his diuretic, as well as the reason for his declining renal function. He did not see cardiology in between labs, and we did not change medications. They only changes were made by nephrology.  It will be helpful if the nephrologist can send over his notes in the future, so that we can confirm any changes to medications stated by the patient and the reasoning behind these changes.

## 2020-06-25 DIAGNOSIS — Z08 Encounter for follow-up examination after completed treatment for malignant neoplasm: Secondary | ICD-10-CM | POA: Diagnosis not present

## 2020-06-25 DIAGNOSIS — Z85828 Personal history of other malignant neoplasm of skin: Secondary | ICD-10-CM | POA: Diagnosis not present

## 2020-06-25 DIAGNOSIS — L820 Inflamed seborrheic keratosis: Secondary | ICD-10-CM | POA: Diagnosis not present

## 2020-06-26 ENCOUNTER — Other Ambulatory Visit: Payer: Self-pay

## 2020-06-26 ENCOUNTER — Ambulatory Visit: Payer: PPO | Admitting: Family

## 2020-06-26 ENCOUNTER — Encounter: Payer: Self-pay | Admitting: Family

## 2020-06-26 VITALS — BP 114/70 | HR 81 | Ht 66.0 in | Wt 212.4 lb

## 2020-06-26 DIAGNOSIS — Z7901 Long term (current) use of anticoagulants: Secondary | ICD-10-CM

## 2020-06-26 DIAGNOSIS — N281 Cyst of kidney, acquired: Secondary | ICD-10-CM

## 2020-06-26 DIAGNOSIS — I4821 Permanent atrial fibrillation: Secondary | ICD-10-CM

## 2020-06-26 DIAGNOSIS — E785 Hyperlipidemia, unspecified: Secondary | ICD-10-CM

## 2020-06-26 DIAGNOSIS — I25118 Atherosclerotic heart disease of native coronary artery with other forms of angina pectoris: Secondary | ICD-10-CM

## 2020-06-26 DIAGNOSIS — I5020 Unspecified systolic (congestive) heart failure: Secondary | ICD-10-CM | POA: Diagnosis not present

## 2020-06-26 DIAGNOSIS — I10 Essential (primary) hypertension: Secondary | ICD-10-CM

## 2020-06-26 DIAGNOSIS — N184 Chronic kidney disease, stage 4 (severe): Secondary | ICD-10-CM

## 2020-06-26 MED ORDER — TORSEMIDE 20 MG PO TABS: 20.0000 mg | ORAL_TABLET | Freq: Every day | ORAL | Status: DC

## 2020-06-26 NOTE — Progress Notes (Signed)
Office Visit    Patient Name: [name] [name] Date of Encounter: 06/26/2020  Primary Care Provider:  [name], MD Primary Cardiologist:  Ida Rogue, MD Electrophysiologist:  None   Chief Complaint    [name] [name] is a 86 y.o. male with a hx of CAD (s/p prior DES to mid LAD 2006, anterior STEMI secondary to ISR 2008 treated with BMS, LHC with PCI to [place] in 2012), HTN, HLD, HFrEF, permanent atrial fibrillation, LBBB, CKD 4, asymptomatic bradycardia, orthopedic surgery (left knee 2019 and right hip 2008), prior tobacco use presents today for follow up of HFrEF.   Past Medical History    Past Medical History:  Diagnosis Date   Arthritis    Asymptomatic Sinus bradycardia    CAD (coronary artery disease)    a. 10/2005 NSTEMI/PCI: LAD 95 (2.75x33 [place] DES); b. 2008 Ant STEMI/PCI LAD 2/2 ISR; c. 09/2011 PCI OM2; d. 02/2019 MV: Fixed mid-dist ant/apical, mid-apical inferolateral dfects w/o ischemia; e. 12/2019 Cath: LM nl, LAD 25p/m, patent stent, D2 80, LCX nl, OM2 patent stent, RCA min irregs.   CKD (chronic kidney disease), stage III    HFrEF (heart failure with reduced ejection fraction) ([place])    a. 12/2019 Echo: EF 20-25%, glob HK. Mid-apical septum, inf, apical AK. Mildly reduced RV fxn. Triv MR.   Hyperlipidemia    Hypertension    Ischemic cardiomyopathy    a. 07/2015 Echo: EF 45-50%; b. 12/2019 Echo: EF 20-25%.   LBBB (left bundle branch block)    Myocardial infarction ([place])    Persistent atrial fibrillation ([place])    a.  Diagnosed January 2021.  CHA2DS2VASc = 5--> xarelto.   Tobacco user    Remote   Past Surgical History:  Procedure Laterality Date   CARDIAC CATHETERIZATION  10/21/2011   s/p stent placement   CARDIAC CATHETERIZATION  2008   stent placement   CARDIAC CATHETERIZATION  2006   stent placement   CARDIOVERSION N/A 03/05/2020   Procedure: CARDIOVERSION;  Surgeon: Minna Merritts, MD;  Location: [facility] ORS;  Service: Cardiovascular;   Laterality: N/A;   CARPAL TUNNEL RELEASE Right 07/22/2016   Procedure: CARPAL TUNNEL RELEASE;  Surgeon: Leanor Kail, MD;  Location: [facility] ORS;  Service: Orthopedics;  Laterality: Right;   JOINT REPLACEMENT     LEFT HEART CATH AND CORONARY ANGIOGRAPHY N/A 01/20/2020   Procedure: LEFT HEART CATH AND CORONARY ANGIOGRAPHY;  Surgeon: Jettie Booze, MD;  Location: [facility];  Service: Cardiovascular;  Laterality: N/A;   TOTAL HIP ARTHROPLASTY     Right   TOTAL KNEE ARTHROPLASTY Left 06/29/2018   Procedure: LEFT TOTAL KNEE ARTHROPLASTY;  Surgeon: Frederik Pear, MD;  Location: [facility];  Service: Orthopedics;  Laterality: Left;   TRANSURETHRAL RESECTION OF PROSTATE      Allergies  Allergies  Allergen Reactions   Penicillins Rash    Did it involve swelling of the face/tongue/throat, SOB, or low BP? No Did it involve sudden or severe rash/hives, skin peeling, or any reaction on the inside of your mouth or nose? No Did you need to seek medical attention at a hospital or doctor's office? No When did it last happen?2-3 years If all above answers are NO, may proceed with cephalosporin use.     History of Present Illness    [name] [name] is a 86 y.o. male with a hx of CAD (s/p prior DES to mid LAD 2006, anterior STEMI secondary to ISR 2008 treated with BMS,  LHC with PCI to [place] in 2012), HTN, HLD, HFrEF, permanent atrial fibrillation, LBBB, CKD 4, asymptomatic bradycardia, orthopedic surgery (left knee 2019 and right hip 2008), prior tobacco use.  He was last seen 05/29/20 by Elenor Quinones, PA.  Most recent cardiac catheterization in October 2012 with DES to the OM 2, LM without disease, proximal LAD 30%, 2 overlapping stents in the mid LAD, 95% ostial diagonal disease jailed by stents, 95% proximal OM 2 disease, mild RCA plaque.  2012 echocardiogram with LVEF 45-50% with apical hypokinesis, mild LVH, mild LAE.  January 2020 when he presented to [facility] with chest pain shortness of  breath and NSTEMI.  Echo with LVEF 20 to 25%.  12/2019 left heart cath with nonobstructive CAD.  Warfarin was resumed post catheterization but later changed to results of by his primary care provider.  After discharge from his January 2020 catheterization he required additional diuresis and was eventually transferred to torsemide.  However he had a creatinine rise to 3.1 along with mild hyperkalemia and torsemide was subsequently held.  He has had to be treated with Kayexalate.  March 2021 underwent cardioversion as an outpatient with repeat 04/06/2020 echo with LVEF 25 to 30% when in normal sinus rhythm with rates in the high 40s at the time of echo reported as asymptomatic bradycardia.  Also underwent peripheral vascular studies for reported LE claudication with work-up showed plaque and medial calcification without segmental stenosis as well as R/L toe brachial indices were abnormal with ankle-brachial indices within normal range.  Overall no evidence of significant lower extremity disease.  When seen in clinic 03/2020 when he was noted to be in recurrent atrial fibrillation.  Antiarrhythmic was deferred due to need to focus on cardiomyopathy.  He was started on Imdur and hydralazine.  Seen in clinic 05/29/2020 at that time his Imdur was increased to 30 mg due to chest pain twinges.  Weight down 5 lbs compared to clinic visit 1 month ago. Reports his lower extremity edema and dyspnea is about the same.  He is taking his torsemide daily in the evening.  We discussed that this may be better to take in the morning the afternoons he is not up all night going to the restroom. Reports no chest pain, pressure, tightness. Denies bleeding complications on Xarelto.  Endorses following a low-sodium diet and tells me he is trying to monitor his potassium intake.  He and his wife split the cooking at home.  He is trying to avoid eating preprepared foods.  EKGs/Labs/Other Studies Reviewed:   The following studies  were reviewed today: Vas [place] LE arterial 04/06/20 R/L: Near normal examination. Plaque and medial calcification without  segmental stenosis.    ABIs 04/06/20 R/L: Resting ankle-brachial index is within normal range. No  evidence of significant lower extremity arterial disease. The  toe-brachial index is abnormal.     Echo 04/06/2020  1. Left ventricular ejection fraction, by estimation, is 25 to 30%. The  left ventricle has severely decreased function. The left ventricle has no  regional wall motion abnormalities. The left ventricular internal cavity  size was mildly dilated. There is  severe akinesis of the left ventricular, mid-apical anteroseptal wall,  anterior segment and apical segment.   2. Right ventricular systolic function is normal. The right ventricular  size is normal. Tricuspid regurgitation signal is inadequate for assessing  PA pressure.   3. Left atrial size was mild to moderately dilated.   4. Right atrial size was mildly dilated.  5. The mitral valve is normal in structure. Mild mitral valve  regurgitation. No evidence of mitral stenosis.   6. The aortic valve is normal in structure. Aortic valve regurgitation is  mild. Mild aortic valve sclerosis is present, with no evidence of aortic  valve stenosis.  Comparison(s): Previous Echo (while in A-Fib) showed LV EF 20-25%,  moderate dilatation of LV cavity, global HK, akinesis of the mid-apical  septum, mid-apical inferior and apical myocardium.      [place] 01/20/2020  Previously placed 2nd Mrg stent (unknown type) is widely patent.  Previously placed Mid LAD stent (unknown type) is widely patent.  Prox LAD to Mid LAD lesion is 25% stenosed.  2nd Diag lesion is 80% stenosed.  LV end diastolic pressure is normal.  There is no aortic valve stenosis. Nonobstructive CAD. Continue medical therapy.  OK to restart warfarin tonight.  Resume IV heparin tonight as well 8 hours post sheath pull. INR in AM.   EKG:  EKG  is ordered today.  The ekg ordered today demonstrates rate controlled atrial fibrillation 81 bpm with known LBBB.  Stable compared to previous.  Recent Labs: 01/20/2020: ALT 27 01/21/2020: Magnesium 1.8 02/26/2020: Hemoglobin 14.9; Platelets 162 06/09/2020: BUN 65; Creatinine, Ser 3.18; Potassium 4.6; Sodium 138  Recent Lipid Panel    Component Value Date/Time   CHOL 110 01/20/2020 0233   TRIG 153 (H) 01/20/2020 0233   HDL 26 (L) 01/20/2020 0233   CHOLHDL 4.2 01/20/2020 0233   VLDL 31 01/20/2020 0233   LDLCALC 53 01/20/2020 0233    Home Medications   Current Meds  Medication Sig   allopurinol (ZYLOPRIM) 100 MG tablet daily.   carvedilol (COREG) 6.25 MG tablet Take 1 tablet (6.25 mg total) by mouth 2 (two) times daily with a meal.   cephALEXin (KEFLEX) 500 MG capsule Take 500 mg by mouth 3 (three) times daily.   hydrALAZINE (APRESOLINE) 10 MG tablet Take 1 tablet (10 mg total) by mouth 3 (three) times daily.   isosorbide mononitrate (IMDUR) 30 MG 24 hr tablet Take 1 tablet (30 mg total) by mouth daily.   lovastatin (MEVACOR) 40 MG tablet Take 40 mg by mouth every evening. Only taking weekly @ this time.   Multiple Vitamin (MULTIVITAMIN WITH MINERALS) TABS tablet Take 2 tablets by mouth daily.   nitroGLYCERIN (NITROSTAT) 0.4 MG SL tablet Place 0.4 mg under the tongue every 5 (five) minutes as needed for chest pain.   Probiotic CAPS Take 1 capsule by mouth daily.   Rivaroxaban (XARELTO) 15 MG TABS tablet Take 15 mg by mouth daily with supper.   rosuvastatin (CRESTOR) 10 MG tablet Take 1 tablet (10 mg total) by mouth daily.   torsemide (DEMADEX) 20 MG tablet Take 1 tablet (20 mg total) by mouth daily.   triamcinolone cream (KENALOG) 0.1 % Apply 1 application topically daily as needed (itching).   [DISCONTINUED] torsemide (DEMADEX) 20 MG tablet Take 1 tablet (20 mg) by mouth once every other day [facility] Wednesdays/ Fridays) (Patient taking differently: Take 20 mg by mouth  daily. )      Review of Systems       Review of Systems  Constitutional: Negative for chills, fever and malaise/fatigue.  Cardiovascular: Positive for dyspnea on exertion and leg swelling. Negative for chest pain, irregular heartbeat, near-syncope, orthopnea, palpitations and syncope.  Respiratory: Negative for cough, shortness of breath and wheezing.   Gastrointestinal: Negative for melena, nausea and vomiting.  Genitourinary: Negative for hematuria.  Neurological: Negative for dizziness,  light-headedness and weakness.   All other systems reviewed and are otherwise negative except as noted above.  Physical Exam    VS:  BP 114/70 (BP Location: Left Arm, Patient Position: Sitting, Cuff Size: Normal)    Pulse 81    Ht 5\' 6"  (1.676 m)    Wt 212 lb 6 oz (96.3 kg)    SpO2 97%    BMI 34.28 kg/m  , BMI Body mass index is 34.28 kg/m. GEN: Well nourished, well developed, in no acute distress. HEENT: normal. Neck: Supple, no JVD, carotid bruits, or masses. Cardiac: Irregularly irregular, no murmurs, rubs, or gallops. No clubbing, cyanosis, edema.  Radials/DP/PT 2+ and equal bilaterally.  Respiratory:  Respirations regular and unlabored, clear to auscultation bilaterally. GI: Soft, nontender, nondistended, BS + x 4. MS: No deformity or atrophy. Skin: Warm and dry, no rash. Neuro:  Strength and sensation are intact. Psych: Normal affect.  Assessment & Plan    1. HFrEF -euvolemic, compensated exam.  Continues to report NYHA II symptoms of dyspnea on exertion.  GDMT includes Coreg, Imdur, hydralazine, torsemide.  Further heart failure therapies limited by CKD.  Continue low-sodium, heart diet. Educated to report weight gain of 3 pounds overnight or 5 pounds in 1 week. BMP, BNP today for monitoring. Defer adjustment of diuretics to nephrology.  He has been taking torsemide 20 mg daily per his report of nephrology directions.  Unfortunately nephrology notes are unavailable in Care  Everywhere.  2. Permanent atrial fibrillation/chronic anticoagulation - Rate controlled.  He is overall asymptomatic with no sensation of palpitations.  Continue Coreg 6.25 mg twice daily.  Continue Xarelto 15 mg daily.  Reduced dose appropriate in the setting of CKD.  Denies bleeding complications.  3. CAD - Stable with no anginal symptoms.  Not occasional ischemic evaluation.  Continue GDMT including beta-blocker and statin.  No aspirin secondary to chronic anticoagulation 4. HTN - BP well controlled. Continue current antihypertensive regimen.   5. HLD, LDL goal less than 70 -continue Crestor 10 mg daily.  6. CKD 4/renal cyst with recommendation for [place] 09/2020 -continue to follow nephrology.  Upcoming appointment next month.  7. Mild MR/AR -no signs or symptoms of worsening valvular function.  Continue to follow with periodic echo.  Disposition: Follow up in October As previously scheduled with Dr. Wallie Renshaw, NP 06/26/2020, 5:23 PM

## 2020-06-26 NOTE — Patient Instructions (Addendum)
Medication Instructions:  No medication changes today.   *If you need a refill on your cardiac medications before your next appointment, please call your pharmacy*  Lab Work: Your physician recommends lab work today: BMET, BNP  If you have labs (blood work) drawn today and your tests are completely normal, you will receive your results only by:  MyChart Message (if you have MyChart) OR  A paper copy in the mail If you have any lab test that is abnormal or we need to change your treatment, we will call you to review the results.  Testing/Procedures: Your EKG today shows rate controlled atrial fibrillation. This is a stable finding.   Follow-Up: At [facility], you and your health needs are our priority.  As part of our continuing mission to provide you with exceptional heart care, we have created designated Provider Care Teams.  These Care Teams include your primary Cardiologist (physician) and Advanced Practice Providers (APPs -  Physician Assistants and Nurse Practitioners) who all work together to provide you with the care you need, when you need it.  We recommend signing up for the patient portal called "MyChart".  Sign up information is provided on this After Visit Summary.  MyChart is used to connect with patients for Virtual Visits (Telemedicine).  Patients are able to view lab/test results, encounter notes, upcoming appointments, etc.  Non-urgent messages can be sent to your provider as well.   To learn more about what you can do with MyChart, go to [email or web address].    Your next appointment:  In October with Dr. Rockey Situ as previously scheduled.    Please call our office for a weight gain 3lbs overnight or 5lbs in 1 week, please call our office.  Please call our office if you notice worsening shortness of breath, swelling, chest pain, or lightheadedness.  Your blood pressure was on the low end of normal this morning. This is a stable finding. If you start experiencing  lightheadedness or dizziness please call our office.  Recommend taking your fluid pill (Torsemide/Demadex) in the morning or afternoon so it does not keep you up at night using the restroom.   DASH Eating Plan DASH stands for "Dietary Approaches to Stop Hypertension." The DASH eating plan is a healthy eating plan that has been shown to reduce high blood pressure (hypertension). It may also reduce your risk for type 2 diabetes, heart disease, and stroke. The DASH eating plan may also help with weight loss. What are tips for following this plan?  General guidelines  Avoid eating more than 2,300 mg (milligrams) of salt (sodium) a day. If you have hypertension, you may need to reduce your sodium intake to 1,500 mg a day.  Limit alcohol intake to no more than 1 drink a day for nonpregnant women and 2 drinks a day for men. One drink equals 12 oz of beer, 5 oz of wine, or 1 oz of hard liquor.  Work with your health care provider to maintain a healthy body weight or to lose weight. Ask what an ideal weight is for you.  Get at least 30 minutes of exercise that causes your heart to beat faster (aerobic exercise) most days of the week. Activities may include walking, swimming, or biking.  Work with your health care provider or diet and nutrition specialist (dietitian) to adjust your eating plan to your individual calorie needs. Reading food labels   Check food labels for the amount of sodium per serving. Choose foods with less than 5 percent  of the Daily Value of sodium. Generally, foods with less than 300 mg of sodium per serving fit into this eating plan.  To find whole grains, look for the word "whole" as the first word in the ingredient list. Shopping  Buy products labeled as "low-sodium" or "no salt added."  Buy fresh foods. Avoid canned foods and premade or frozen meals. Cooking  Avoid adding salt when cooking. Use salt-free seasonings or herbs instead of table salt or sea salt. Check  with your health care provider or pharmacist before using salt substitutes.  Do not fry foods. Cook foods using healthy methods such as baking, boiling, grilling, and broiling instead.  Cook with heart-healthy oils, such as olive, canola, soybean, or sunflower oil. Meal planning  Eat a balanced diet that includes: ? 5 or more servings of fruits and vegetables each day. At each meal, try to fill half of your plate with fruits and vegetables. ? Up to 6-8 servings of whole grains each day. ? Less than 6 oz of lean meat, poultry, or fish each day. A 3-oz serving of meat is about the same size as a deck of cards. One egg equals 1 oz. ? 2 servings of low-fat dairy each day. ? A serving of nuts, seeds, or beans 5 times each week. ? Heart-healthy fats. Healthy fats called Omega-3 fatty acids are found in foods such as flaxseeds and coldwater fish, like sardines, salmon, and mackerel.  Limit how much you eat of the following: ? Canned or prepackaged foods. ? Food that is high in trans fat, such as fried foods. ? Food that is high in saturated fat, such as fatty meat. ? Sweets, desserts, sugary drinks, and other foods with added sugar. ? Full-fat dairy products.  Do not salt foods before eating.  Try to eat at least 2 vegetarian meals each week.  Eat more home-cooked food and less restaurant, buffet, and fast food.  When eating at a restaurant, ask that your food be prepared with less salt or no salt, if possible. What foods are recommended? The items listed may not be a complete list. Talk with your dietitian about what dietary choices are best for you. Grains Whole-grain or whole-wheat bread. Whole-grain or whole-wheat pasta. Brown rice. [place]. Bulgur. Whole-grain and low-sodium cereals. Pita bread. Low-fat, low-sodium crackers. Whole-wheat flour tortillas. Vegetables Fresh or frozen vegetables (raw, steamed, roasted, or grilled). Low-sodium or reduced-sodium tomato and vegetable  juice. Low-sodium or reduced-sodium tomato sauce and tomato paste. Low-sodium or reduced-sodium canned vegetables. Fruits All fresh, dried, or frozen fruit. Canned fruit in natural juice (without added sugar). Meat and other protein foods Skinless chicken or [place]. Ground chicken or [place]. Pork with fat trimmed off. Fish and seafood. Egg whites. Dried beans, peas, or lentils. Unsalted nuts, nut butters, and seeds. Unsalted canned beans. Lean cuts of beef with fat trimmed off. Low-sodium, lean deli meat. Dairy Low-fat (1%) or fat-free (skim) milk. Fat-free, low-fat, or reduced-fat cheeses. Nonfat, low-sodium ricotta or cottage cheese. Low-fat or nonfat yogurt. Low-fat, low-sodium cheese. Fats and oils Soft margarine without trans fats. Vegetable oil. Low-fat, reduced-fat, or light mayonnaise and salad dressings (reduced-sodium). Canola, safflower, olive, soybean, and sunflower oils. Avocado. Seasoning and other foods Herbs. Spices. Seasoning mixes without salt. Unsalted popcorn and pretzels. Fat-free sweets. What foods are not recommended? The items listed may not be a complete list. Talk with your dietitian about what dietary choices are best for you. Grains Baked goods made with fat, such as croissants, muffins,  or some breads. Dry pasta or rice meal packs. Vegetables Creamed or fried vegetables. Vegetables in a cheese sauce. Regular canned vegetables (not low-sodium or reduced-sodium). Regular canned tomato sauce and paste (not low-sodium or reduced-sodium). Regular tomato and vegetable juice (not low-sodium or reduced-sodium). Angie Fava. Olives. Fruits Canned fruit in a light or heavy syrup. Fried fruit. Fruit in cream or butter sauce. Meat and other protein foods Fatty cuts of meat. Ribs. Fried meat. Berniece Salines. Sausage. Bologna and other processed lunch meats. Salami. Fatback. Hotdogs. Bratwurst. Salted nuts and seeds. Canned beans with added salt. Canned or smoked fish. Whole eggs or egg yolks.  Chicken or [place] with skin. Dairy Whole or 2% milk, cream, and half-and-half. Whole or full-fat cream cheese. Whole-fat or sweetened yogurt. Full-fat cheese. Nondairy creamers. Whipped toppings. Processed cheese and cheese spreads. Fats and oils Butter. Stick margarine. Lard. Shortening. Ghee. Bacon fat. Tropical oils, such as coconut, palm kernel, or palm oil. Seasoning and other foods Salted popcorn and pretzels. Onion salt, garlic salt, seasoned salt, table salt, and sea salt. Worcestershire sauce. Tartar sauce. Barbecue sauce. Teriyaki sauce. Soy sauce, including reduced-sodium. Steak sauce. Canned and packaged gravies. Fish sauce. Oyster sauce. Cocktail sauce. Horseradish that you find on the shelf. Ketchup. Mustard. Meat flavorings and tenderizers. Bouillon cubes. Hot sauce and Tabasco sauce. Premade or packaged marinades. Premade or packaged taco seasonings. Relishes. Regular salad dressings. Where to find more information:  National Heart, Lung, and [place]: https://wilson-eaton.com/  American Heart Association: www.heart.org Summary  The DASH eating plan is a healthy eating plan that has been shown to reduce high blood pressure (hypertension). It may also reduce your risk for type 2 diabetes, heart disease, and stroke.  With the DASH eating plan, you should limit salt (sodium) intake to 2,300 mg a day. If you have hypertension, you may need to reduce your sodium intake to 1,500 mg a day.  When on the DASH eating plan, aim to eat more fresh fruits and vegetables, whole grains, lean proteins, low-fat dairy, and heart-healthy fats.  Work with your health care provider or diet and nutrition specialist (dietitian) to adjust your eating plan to your individual calorie needs. This information is not intended to replace advice given to you by your health care provider. Make sure you discuss any questions you have with your health care provider. Document Revised: 11/24/2017 Document Reviewed:  12/05/2016 Elsevier Patient Education  2020 [employer].

## 2020-06-27 LAB — BASIC METABOLIC PANEL
BUN/Creatinine Ratio: 21 (ref 10–24)
BUN: 57 mg/dL — ABNORMAL HIGH (ref 8–27)
CO2: 21 mmol/L (ref 20–29)
Calcium: 9.2 mg/dL (ref 8.6–10.2)
Chloride: 96 mmol/L (ref 96–106)
Creatinine, Ser: 2.78 mg/dL — ABNORMAL HIGH (ref 0.76–1.27)
GFR calc Af Amer: 23 mL/min/{1.73_m2} — ABNORMAL LOW (ref >59)
GFR calc non Af Amer: 20 mL/min/{1.73_m2} — ABNORMAL LOW (ref >59)
Glucose: 111 mg/dL — ABNORMAL HIGH (ref 65–99)
Potassium: 5.8 mmol/L — ABNORMAL HIGH (ref 3.5–5.2)
Sodium: 136 mmol/L (ref 134–144)

## 2020-06-27 LAB — BRAIN NATRIURETIC PEPTIDE: BNP: 88 pg/mL (ref 0.0–100.0)

## 2020-06-30 ENCOUNTER — Telehealth: Payer: Self-pay

## 2020-06-30 DIAGNOSIS — I10 Essential (primary) hypertension: Secondary | ICD-10-CM

## 2020-06-30 NOTE — Telephone Encounter (Signed)
Called to give the patient lab results. Lmtcb.

## 2020-06-30 NOTE — Telephone Encounter (Signed)
Patient returning call.

## 2020-06-30 NOTE — Telephone Encounter (Signed)
-----   Message from Loel Dubonnet, NP sent at 06/30/2020  7:56 AM EDT ----- Kidney function stable. Potassium mildly elevated.   Recommend avoiding salt substitutes and dietary sources of potassium.   He has upcoming appt with nephrology (don't recall when). If that is not this week, best for him to come back to [facility] for repeat BMP for monitoring of potassium.

## 2020-06-30 NOTE — Telephone Encounter (Signed)
Results called to pt. Pt verbalized understanding of results and recommendations. He will plan to go to the [facility] on Thursday afternoon or Friday for repeat BMET.

## 2020-07-02 ENCOUNTER — Other Ambulatory Visit
Admission: RE | Admit: 2020-07-02 | Discharge: 2020-07-02 | Disposition: A | Discharge status: Home / Self Care | Payer: PPO | Attending: Family | Admitting: Family

## 2020-07-02 DIAGNOSIS — I10 Essential (primary) hypertension: Secondary | ICD-10-CM | POA: Insufficient documentation

## 2020-07-02 LAB — BASIC METABOLIC PANEL
Anion gap: 13 (ref 5–15)
BUN: 61 mg/dL — ABNORMAL HIGH (ref 8–23)
CO2: 26 mmol/L (ref 22–32)
Calcium: 9.3 mg/dL (ref 8.9–10.3)
Chloride: 99 mmol/L (ref 98–111)
Creatinine, Ser: 2.86 mg/dL — ABNORMAL HIGH (ref 0.61–1.24)
GFR calc Af Amer: 22 mL/min — ABNORMAL LOW (ref >60)
GFR calc non Af Amer: 19 mL/min — ABNORMAL LOW (ref >60)
Glucose, Bld: 122 mg/dL — ABNORMAL HIGH (ref 70–99)
Potassium: 4.6 mmol/L (ref 3.5–5.1)
Sodium: 138 mmol/L (ref 135–145)

## 2020-07-15 DIAGNOSIS — N184 Chronic kidney disease, stage 4 (severe): Secondary | ICD-10-CM | POA: Diagnosis not present

## 2020-07-29 ENCOUNTER — Telehealth: Payer: Self-pay | Admitting: Cardiovascular Disease

## 2020-07-29 NOTE — Telephone Encounter (Signed)
Pt c/o medication issue:  1. Name of Medication: Carvedilol 6.25 bid and Hydralazine 10 mg 3x day  2. How are you currently taking this medication (dosage and times per day)? See above  3. Are you having a reaction (difficulty breathing--STAT)? no  4. What is your medication issue? Needs clarification on medications and dosages

## 2020-07-29 NOTE — Telephone Encounter (Signed)
Reviewed information with patient and he verbalized understanding with no further questions at this time. Informed him to verify with pharmacy for next refill of his hydrochlorothiazide. He was very appreciative of the call and assisting with this. Advised to please call back if any further questions or needs.

## 2020-07-29 NOTE — Telephone Encounter (Signed)
They were filling off old prescriptions and discussed each one and they corrected their system. No further need at this time.

## 2020-07-30 DIAGNOSIS — C44622 Squamous cell carcinoma of skin of right upper limb, including shoulder: Secondary | ICD-10-CM | POA: Diagnosis not present

## 2020-07-30 DIAGNOSIS — X32XXXD Exposure to sunlight, subsequent encounter: Secondary | ICD-10-CM | POA: Diagnosis not present

## 2020-07-30 DIAGNOSIS — Z85828 Personal history of other malignant neoplasm of skin: Secondary | ICD-10-CM | POA: Diagnosis not present

## 2020-07-30 DIAGNOSIS — L570 Actinic keratosis: Secondary | ICD-10-CM | POA: Diagnosis not present

## 2020-07-30 DIAGNOSIS — Z08 Encounter for follow-up examination after completed treatment for malignant neoplasm: Secondary | ICD-10-CM | POA: Diagnosis not present

## 2020-07-30 DIAGNOSIS — C44629 Squamous cell carcinoma of skin of left upper limb, including shoulder: Secondary | ICD-10-CM | POA: Diagnosis not present

## 2020-08-05 DIAGNOSIS — I255 Ischemic cardiomyopathy: Secondary | ICD-10-CM | POA: Diagnosis not present

## 2020-08-05 DIAGNOSIS — N184 Chronic kidney disease, stage 4 (severe): Secondary | ICD-10-CM | POA: Diagnosis not present

## 2020-08-05 DIAGNOSIS — E538 Deficiency of other specified B group vitamins: Secondary | ICD-10-CM | POA: Diagnosis not present

## 2020-08-05 DIAGNOSIS — R739 Hyperglycemia, unspecified: Secondary | ICD-10-CM | POA: Diagnosis not present

## 2020-08-05 DIAGNOSIS — E875 Hyperkalemia: Secondary | ICD-10-CM | POA: Diagnosis not present

## 2020-08-05 DIAGNOSIS — I129 Hypertensive chronic kidney disease with stage 1 through stage 4 chronic kidney disease, or unspecified chronic kidney disease: Secondary | ICD-10-CM | POA: Diagnosis not present

## 2020-08-05 DIAGNOSIS — E785 Hyperlipidemia, unspecified: Secondary | ICD-10-CM | POA: Diagnosis not present

## 2020-08-13 DIAGNOSIS — I10 Essential (primary) hypertension: Secondary | ICD-10-CM | POA: Diagnosis not present

## 2020-08-13 DIAGNOSIS — M810 Age-related osteoporosis without current pathological fracture: Secondary | ICD-10-CM | POA: Diagnosis not present

## 2020-08-13 DIAGNOSIS — D692 Other nonthrombocytopenic purpura: Secondary | ICD-10-CM | POA: Diagnosis not present

## 2020-08-13 DIAGNOSIS — I77811 Abdominal aortic ectasia: Secondary | ICD-10-CM | POA: Diagnosis not present

## 2020-08-13 DIAGNOSIS — E7849 Other hyperlipidemia: Secondary | ICD-10-CM | POA: Diagnosis not present

## 2020-08-13 DIAGNOSIS — I5022 Chronic systolic (congestive) heart failure: Secondary | ICD-10-CM | POA: Diagnosis not present

## 2020-08-13 DIAGNOSIS — Z0000 Encounter for general adult medical examination without abnormal findings: Secondary | ICD-10-CM | POA: Diagnosis not present

## 2020-08-13 DIAGNOSIS — I25118 Atherosclerotic heart disease of native coronary artery with other forms of angina pectoris: Secondary | ICD-10-CM | POA: Diagnosis not present

## 2020-08-13 DIAGNOSIS — I480 Paroxysmal atrial fibrillation: Secondary | ICD-10-CM | POA: Diagnosis not present

## 2020-08-13 DIAGNOSIS — Z96641 Presence of right artificial hip joint: Secondary | ICD-10-CM | POA: Diagnosis not present

## 2020-08-13 DIAGNOSIS — R739 Hyperglycemia, unspecified: Secondary | ICD-10-CM | POA: Diagnosis not present

## 2020-08-13 DIAGNOSIS — E538 Deficiency of other specified B group vitamins: Secondary | ICD-10-CM | POA: Diagnosis not present

## 2020-08-13 DIAGNOSIS — N183 Chronic kidney disease, stage 3 unspecified: Secondary | ICD-10-CM | POA: Diagnosis not present

## 2020-08-19 NOTE — Progress Notes (Signed)
Office Visit    Patient Name: [name] [name] Date of Encounter: 08/20/2020  Primary Care Provider:  [name], MD Primary Cardiologist:  Ida Rogue, MD Electrophysiologist:  None   Chief Complaint    [name] [name] is a 86 y.o. male with a hx of CAD (s/p prior DES to mid LAD 2006, anterior STEMI secondary to ISR 2008 treated with BMS, LHC with PCI to [place] in 2012), HTN, HLD, HFrEF, permanent atrial fibrillation, LBBB, CKD 4, asymptomatic bradycardia, orthopedic surgery (left knee 2019 and right hip 2008), prior tobacco use presents today for abnormal EKG at PCP  Past Medical History    Past Medical History:  Diagnosis Date  . Arthritis   . Asymptomatic Sinus bradycardia   . CAD (coronary artery disease)    a. 10/2005 NSTEMI/PCI: LAD 95 (2.75x33 [place]her DES); b. 2008 Ant STEMI/PCI LAD 2/2 ISR; c. 09/2011 PCI OM2; d. 02/2019 MV: Fixed mid-dist ant/apical, mid-apical inferolateral dfects w/o ischemia; e. 12/2019 Cath: LM nl, LAD 25p/m, patent stent, D2 80, LCX nl, OM2 patent stent, RCA min irregs.  . CKD (chronic kidney disease), stage III   . HFrEF (heart failure with reduced ejection fraction) ([place])    a. 12/2019 Echo: EF 20-25%, glob HK. Mid-apical septum, inf, apical AK. Mildly reduced RV fxn. Triv MR.  Marland Kitchen Hyperlipidemia   . Hypertension   . Ischemic cardiomyopathy    a. 07/2015 Echo: EF 45-50%; b. 12/2019 Echo: EF 20-25%.  Marland Kitchen LBBB (left bundle branch block)   . Myocardial infarction ([place])   . Persistent atrial fibrillation ([place])    a.  Diagnosed January 2021.  CHA2DS2VASc = 5--> xarelto.  . Tobacco user    Remote   Past Surgical History:  Procedure Laterality Date  . CARDIAC CATHETERIZATION  10/21/2011   s/p stent placement  . CARDIAC CATHETERIZATION  2008   stent placement  . CARDIAC CATHETERIZATION  2006   stent placement  . CARDIOVERSION N/A 03/05/2020   Procedure: CARDIOVERSION;  Surgeon: Minna Merritts, MD;  Location: [facility] ORS;  Service: Cardiovascular;   Laterality: N/A;  . CARPAL TUNNEL RELEASE Right 07/22/2016   Procedure: CARPAL TUNNEL RELEASE;  Surgeon: Leanor Kail, MD;  Location: [facility] ORS;  Service: Orthopedics;  Laterality: Right;  . JOINT REPLACEMENT    . LEFT HEART CATH AND CORONARY ANGIOGRAPHY N/A 01/20/2020   Procedure: LEFT HEART CATH AND CORONARY ANGIOGRAPHY;  Surgeon: Jettie Booze, MD;  Location: [facility];  Service: Cardiovascular;  Laterality: N/A;  . TOTAL HIP ARTHROPLASTY     Right  . TOTAL KNEE ARTHROPLASTY Left 06/29/2018   Procedure: LEFT TOTAL KNEE ARTHROPLASTY;  Surgeon: Frederik Pear, MD;  Location: [facility];  Service: Orthopedics;  Laterality: Left;  . TRANSURETHRAL RESECTION OF PROSTATE      Allergies  Allergies  Allergen Reactions  . Penicillins Rash    Did it involve swelling of the face/tongue/throat, SOB, or low BP? No Did it involve sudden or severe rash/hives, skin peeling, or any reaction on the inside of your mouth or nose? No Did you need to seek medical attention at a hospital or doctor's office? No When did it last happen?2-3 years If all above answers are "NO", may proceed with cephalosporin use.     History of Present Illness    [name] [name] is a 86 y.o. male with a hx of CAD (s/p prior DES to mid LAD 2006, anterior STEMI secondary to ISR 2008 treated with BMS, LHC  with PCI to OM2 in 2012), HTN, HLD, HFrEF, permanent atrial fibrillation, LBBB, CKD 4, asymptomatic bradycardia, orthopedic surgery (left knee 2019 and right hip 2008), prior tobacco use.  He was last seen 06/26/20.  Most recent cardiac catheterization in October 2012 with DES to the OM 2, LM without disease, proximal LAD 30%, 2 overlapping stents in the mid LAD, 95% ostial diagonal disease jailed by stents, 95% proximal OM 2 disease, mild RCA plaque.  2012 echocardiogram with LVEF 45-50% with apical hypokinesis, mild LVH, mild LAE.  January 2020 when he presented to [facility] with chest pain shortness of breath and  NSTEMI.  Echo with LVEF 20 to 25%.  12/2019 left heart cath with nonobstructive CAD.  Warfarin was resumed post catheterization but later changed to Xarelto by his primary care provider.  After discharge from his January 2020 catheterization he required additional diuresis and was switched to torsemide.  However he had a creatinine rise to 3.1 along with mild hyperkalemia and torsemide was subsequently held.  He has had to be treated with Kayexalate.  March 2021 underwent DCCV as an outpatient with repeat 04/06/2020 echo with LVEF 25 to 30% when in NSR with rates in the high 40s at the time of echo reported as asymptomatic bradycardia. Underwent peripheral vascular studies for reported LE claudication with work-up showed plaque and medial calcification without segmental stenosis as well as R/L toe brachial indices were abnormal with ankle-brachial indices within normal range.  Overall no evidence of significant lower extremity disease.  Seen 03/2020 by Dr. Rockey Situ with recurrent atrial fib.  Antiarrhythmic deferred due to need to focus on cardiomyopathy.  Imdur and Hydralazine added.  05/29/2020 Imdur increased to 30 mg due to chest pain twinges.  Seen in clinic 06/26/2020 with weight loss of 5 pounds over 1 month, stable DOE/LE edema.  08/05/20 Hb 13.6, creatinine 2.6, GFR 23, K 4.8, ALT 11, AST 14, A1c 6, total cholesterol 93, triglycerides 130, LDL 38, HDL 29, TSH 2.015  Sees Dr. Clover Mealy of nephrology. He saw them a couple weeks ago. His nephrologist wanted him to take Torsemide daily. His PCP last week recommended he take his fluid pill every other day.   Presents today with weight loss of 7 lbs since last seen.  Reports no chest pain, pressure, tightness. He has some concerns about Hydralazine. Wonders whether this is safe for his kidneys.  We reviewed that it was safe setting of his kidney dysfunction. Reports he is short of breath with exertion which is overall stable.  This has been ongoing since he was  diagnosed with atrial fibrillation earlier this year.  Endorses fatigue.  He reports some coughing and wheezing ongoing for "quite awhile". Reports lower exercise tolerance which is frustrating to him. SBP at home 110-120.   EKGs/Labs/Other Studies Reviewed:   The following studies were reviewed today:  Vas [place] LE arterial 04/06/20 R/L: Near normal examination. Plaque and medial calcification without  segmental stenosis.    ABIs 04/06/20 R/L: Resting ankle-brachial index is within normal range. No  evidence of significant lower extremity arterial disease. The  toe-brachial index is abnormal.     Echo 04/06/2020  1. Left ventricular ejection fraction, by estimation, is 25 to 30%. The  left ventricle has severely decreased function. The left ventricle has no  regional wall motion abnormalities. The left ventricular internal cavity  size was mildly dilated. There is  severe akinesis of the left ventricular, mid-apical anteroseptal wall,  anterior segment and apical segment.  2. Right ventricular systolic function is normal. The right ventricular  size is normal. Tricuspid regurgitation signal is inadequate for assessing  PA pressure.   3. Left atrial size was mild to moderately dilated.   4. Right atrial size was mildly dilated.   5. The mitral valve is normal in structure. Mild mitral valve  regurgitation. No evidence of mitral stenosis.   6. The aortic valve is normal in structure. Aortic valve regurgitation is  mild. Mild aortic valve sclerosis is present, with no evidence of aortic  valve stenosis.  Comparison(s): Previous Echo (while in A-Fib) showed LV EF 20-25%,  moderate dilatation of LV cavity, global HK, akinesis of the mid-apical  septum, mid-apical inferior and apical myocardium.    [place] 01/20/2020  Previously placed 2nd Mrg stent (unknown type) is widely patent.  Previously placed Mid LAD stent (unknown type) is widely patent.  Prox LAD to Mid LAD lesion is 25%  stenosed.  2nd Diag lesion is 80% stenosed.  LV end diastolic pressure is normal.  There is no aortic valve stenosis. Nonobstructive CAD. Continue medical therapy.  OK to restart warfarin tonight.  Resume IV heparin tonight as well 8 hours post sheath pull. INR in AM.   EKG:  EKG is ordered today.  The ekg ordered today demonstrates atrial fibrillation 93 bpm with known LBBB and occasional PVC versus aberrant conduction..  Stable compared to previous.  Recent Labs: 01/20/2020: ALT 27 01/21/2020: Magnesium 1.8 02/26/2020: Hemoglobin 14.9; Platelets 162 06/26/2020: BNP 88.0 07/02/2020: BUN 61; Creatinine, Ser 2.86; Potassium 4.6; Sodium 138  Recent Lipid Panel    Component Value Date/Time   CHOL 110 01/20/2020 0233   TRIG 153 (H) 01/20/2020 0233   HDL 26 (L) 01/20/2020 0233   CHOLHDL 4.2 01/20/2020 0233   VLDL 31 01/20/2020 0233   LDLCALC 53 01/20/2020 0233    Home Medications   No outpatient medications have been marked as taking for the 08/20/20 encounter (Appointment) with Loel Dubonnet, NP.    Review of Systems    Review of Systems  Constitutional: Negative for chills, fever and malaise/fatigue.  Cardiovascular: Positive for dyspnea on exertion. Negative for chest pain, irregular heartbeat, leg swelling, near-syncope, orthopnea, palpitations and syncope.  Respiratory: Positive for cough, shortness of breath and wheezing.   Gastrointestinal: Negative for melena, nausea and vomiting.  Genitourinary: Negative for hematuria.  Neurological: Negative for dizziness, light-headedness and weakness.   All other systems reviewed and are otherwise negative except as noted above.  Physical Exam    VS:  There were no vitals taken for this visit. , BMI There is no height or weight on file to calculate BMI. GEN: Well nourished, well developed, in no acute distress. HEENT: normal. Neck: Supple, no JVD, carotid bruits, or masses. Cardiac: Irregularly irregular, no murmurs, rubs, or  gallops. No clubbing, cyanosis, edema.  Radials/PT 2+ and equal bilaterally.  Respiratory:  Respirations regular and unlabored, clear to auscultation bilaterally. GI: Soft, nontender, nondistended MS: No deformity or atrophy. Skin: Warm and dry, no rash. Neuro:  Strength and sensation are intact. Psych: Normal affect.  Assessment & Plan    1. HFrEF - Echo 03/2020 EF 25-30%. Euvolemic and well compensated on exam. NYHA II-III with dyspnea and fatigue. GDMT includes Coreg, Imdur, hydralazine, torsemide.  Further heart failure therapies limited by CKD (no ACE/ARB/ARNI/MRA). Could consider [place] in future. Continue low-sodium, heart diet. Educated to report weight gain of 3 pounds overnight or 5 pounds in 1 week.   2. Permanent  atrial fibrillation/chronic anticoagulation - Rate controlled on EKG today..  Continue Coreg 6.25 mg twice daily.  Continue Xarelto 15 mg daily.  Reduced dose appropriate in the setting of CKD.  Denies bleeding complications. Due to persistent dyspnea and fatigue, will plan for AAD and cardioversion. Start Amiodarone 400mg  BID x7 days, then 200mg  BID x7 days, then 200mg  daily. Follow up in 2 weeks for repeat EKG and scheduling of DCCV if indicated.   3. CAD - Stable with no anginal symptoms.  Continue GDMT including beta-blocker and statin.  No aspirin secondary to chronic anticoagulation  4. HTN - BP well controlled. Continue current antihypertensive regimen.   5. HLD, LDL goal less than 70 -continue Crestor 10 mg daily.  6. CKD 4/renal cyst with recommendation for [place] 09/2020 - Continue to follow nephrology.    7. Mild MR/AR - No signs or symptoms of worsening valvular function.  Continue to follow with periodic echo.  8. LBBB - Stable finding on EKG. No lightheadedness, dizziness, near syncope, nor syncope.   Disposition: Follow up in 2 week(s) with Dr. Rockey Situ or APP for EKG and discussion of cardioversion.   Loel Dubonnet, NP 08/20/2020, 8:14 AM

## 2020-08-20 ENCOUNTER — Other Ambulatory Visit: Payer: Self-pay

## 2020-08-20 ENCOUNTER — Encounter: Payer: Self-pay | Admitting: Family

## 2020-08-20 ENCOUNTER — Ambulatory Visit: Payer: PPO | Admitting: Family

## 2020-08-20 VITALS — BP 132/60 | HR 93 | Ht 66.0 in | Wt 205.0 lb

## 2020-08-20 DIAGNOSIS — Z7901 Long term (current) use of anticoagulants: Secondary | ICD-10-CM | POA: Diagnosis not present

## 2020-08-20 DIAGNOSIS — I10 Essential (primary) hypertension: Secondary | ICD-10-CM

## 2020-08-20 DIAGNOSIS — N184 Chronic kidney disease, stage 4 (severe): Secondary | ICD-10-CM

## 2020-08-20 DIAGNOSIS — I25118 Atherosclerotic heart disease of native coronary artery with other forms of angina pectoris: Secondary | ICD-10-CM

## 2020-08-20 DIAGNOSIS — E785 Hyperlipidemia, unspecified: Secondary | ICD-10-CM

## 2020-08-20 DIAGNOSIS — I447 Left bundle-branch block, unspecified: Secondary | ICD-10-CM | POA: Diagnosis not present

## 2020-08-20 DIAGNOSIS — I4819 Other persistent atrial fibrillation: Secondary | ICD-10-CM

## 2020-08-20 MED ORDER — AMIODARONE HCL 200 MG PO TABS: ORAL_TABLET | ORAL | 0 refills | Status: DC

## 2020-08-20 MED ORDER — HYDRALAZINE HCL 10 MG PO TABS: 10.0000 mg | ORAL_TABLET | Freq: Three times a day (TID) | ORAL | 1 refills | Status: DC

## 2020-08-20 NOTE — Patient Instructions (Addendum)
Medication Instructions:  Your physician has recommended you make the following change in your medication:   START Amiodarone   For one week: (8/27 - 9/2) take 400mg  twice daily  After one week: (9/3 - 9/10) take 200mg  twice daily  Then starting 09/05/20, change to 200mg  daily.  *If you need a refill on your cardiac medications before your next appointment, please call your pharmacy*  Lab Work: No lab work today.   Testing/Procedures: Your EKG today shows atrial fibrillation.   Follow-Up: At [facility], you and your health needs are our priority.  As part of our continuing mission to provide you with exceptional heart care, we have created designated Provider Care Teams.  These Care Teams include your primary Cardiologist (physician) and Advanced Practice Providers (APPs -  Physician Assistants and Nurse Practitioners) who all work together to provide you with the care you need, when you need it.  We recommend signing up for the patient portal called "MyChart".  Sign up information is provided on this After Visit Summary.  MyChart is used to connect with patients for Virtual Visits (Telemedicine).  Patients are able to view lab/test results, encounter notes, upcoming appointments, etc.  Non-urgent messages can be sent to your provider as well.   To learn more about what you can do with MyChart, go to [email or web address].    Your next appointment:   2 week(s)  The format for your next appointment:   In Person  Provider:   You may see Ida Rogue, MD or one of the following Advanced Practice Providers on your designated Care Team:    Murray Hodgkins, NP  Christell Faith, PA-C  Marrianne Mood, PA-C  Laurann Montana, NP  Other Instructions  We have started you on Amiodarone which will hopefully help [place] get you back into a normal sinus rhythm.

## 2020-09-03 ENCOUNTER — Ambulatory Visit: Payer: PPO | Admitting: Family

## 2020-09-03 ENCOUNTER — Encounter: Payer: Self-pay | Admitting: Family

## 2020-09-03 ENCOUNTER — Other Ambulatory Visit: Payer: Self-pay

## 2020-09-03 VITALS — BP 100/50 | HR 71 | Ht 65.0 in | Wt 206.1 lb

## 2020-09-03 DIAGNOSIS — I5020 Unspecified systolic (congestive) heart failure: Secondary | ICD-10-CM | POA: Diagnosis not present

## 2020-09-03 DIAGNOSIS — I4819 Other persistent atrial fibrillation: Secondary | ICD-10-CM

## 2020-09-03 DIAGNOSIS — I10 Essential (primary) hypertension: Secondary | ICD-10-CM

## 2020-09-03 DIAGNOSIS — I447 Left bundle-branch block, unspecified: Secondary | ICD-10-CM

## 2020-09-03 DIAGNOSIS — Z7901 Long term (current) use of anticoagulants: Secondary | ICD-10-CM

## 2020-09-03 DIAGNOSIS — I25118 Atherosclerotic heart disease of native coronary artery with other forms of angina pectoris: Secondary | ICD-10-CM | POA: Diagnosis not present

## 2020-09-03 MED ORDER — ISOSORBIDE MONONITRATE ER 30 MG PO TB24: 15.0000 mg | ORAL_TABLET | Freq: Every day | ORAL | 1 refills | Status: DC

## 2020-09-03 MED ORDER — AMIODARONE HCL 200 MG PO TABS: 200.0000 mg | ORAL_TABLET | Freq: Every day | ORAL | 0 refills | Status: DC

## 2020-09-03 NOTE — Progress Notes (Signed)
Office Visit    Patient Name: [name] [name] Date of Encounter: 09/03/2020  Primary Care Provider:  [name], MD Primary Cardiologist:  Ida Rogue, MD Electrophysiologist:  None   Chief Complaint    [name] [name] is a 86 y.o. male with a hx of CAD (s/p prior DES to mid LAD 2006, anterior STEMI secondary to ISR 2008 treated with BMS, LHC with PCI to [place] in 2012), HTN, HLD, HFrEF, atrial fibrillation, LBBB, CKD 4, asymptomatic bradycardia, orthopedic surgery (left knee 2019 and right hip 2008), prior tobacco use presents today for follow up after initiation of Amiodarone.   Past Medical History    Past Medical History:  Diagnosis Date  . Arthritis   . Asymptomatic Sinus bradycardia   . CAD (coronary artery disease)    a. 10/2005 NSTEMI/PCI: LAD 95 (2.75x33 [place] DES); b. 2008 Ant STEMI/PCI LAD 2/2 ISR; c. 09/2011 PCI OM2; d. 02/2019 MV: Fixed mid-dist ant/apical, mid-apical inferolateral dfects w/o ischemia; e. 12/2019 Cath: LM nl, LAD 25p/m, patent stent, D2 80, LCX nl, OM2 patent stent, RCA min irregs.  . CKD (chronic kidney disease), stage III   . HFrEF (heart failure with reduced ejection fraction) ([place])    a. 12/2019 Echo: EF 20-25%, glob HK. Mid-apical septum, inf, apical AK. Mildly reduced RV fxn. Triv MR.  Marland Kitchen Hyperlipidemia   . Hypertension   . Ischemic cardiomyopathy    a. 07/2015 Echo: EF 45-50%; b. 12/2019 Echo: EF 20-25%.  Marland Kitchen LBBB (left bundle branch block)   . Myocardial infarction ([place])   . Persistent atrial fibrillation ([place])    a.  Diagnosed January 2021.  CHA2DS2VASc = 5--> xarelto.  . Tobacco user    Remote   Past Surgical History:  Procedure Laterality Date  . CARDIAC CATHETERIZATION  10/21/2011   s/p stent placement  . CARDIAC CATHETERIZATION  2008   stent placement  . CARDIAC CATHETERIZATION  2006   stent placement  . CARDIOVERSION N/A 03/05/2020   Procedure: CARDIOVERSION;  Surgeon: Minna Merritts, MD;  Location: [facility] ORS;  Service:  Cardiovascular;  Laterality: N/A;  . CARPAL TUNNEL RELEASE Right 07/22/2016   Procedure: CARPAL TUNNEL RELEASE;  Surgeon: Leanor Kail, MD;  Location: [facility] OR[facility];  Service: Orthopedics;  Laterality: Right;  . JOINT REPLACEMENT    . LEFT HEART CATH AND CORONARY ANGIOGRAPHY N/A 01/20/2020   Procedure: LEFT HEART CATH AND CORONARY ANGIOGRAPHY;  Surgeon: Jettie Booze, MD;  Location: [facility];  Service: Cardiovascular;  Laterality: N/A;  . TOTAL HIP ARTHROPLASTY     Right  . TOTAL KNEE ARTHROPLASTY Left 06/29/2018   Procedure: LEFT TOTAL KNEE ARTHROPLASTY;  Surgeon: Frederik Pear, MD;  Location: [facility];  Service: Orthopedics;  Laterality: Left;  . TRANSURETHRAL RESECTION OF PROSTATE      Allergies  Allergies  Allergen Reactions  . Penicillins Rash    Did it involve swelling of the face/tongue/throat, SOB, or low BP? No Did it involve sudden or severe rash/hives, skin peeling, or any reaction on the inside of your mouth or nose? No Did you need to seek medical attention at a hospital or doctor's office? No When did it last happen?2-3 years If all above answers are "NO", may proceed with cephalosporin use.     History of Present Illness    [name] [name] is a 86 y.o. male with a hx of CAD (s/p prior DES to mid LAD 2006, anterior STEMI secondary to ISR 2008 treated with BMS,  LHC with PCI to [place] in 2012), HTN, HLD, HFrEF, atrial fibrillation, LBBB, CKD 4, asymptomatic bradycardia, orthopedic surgery (left knee 2019 and right hip 2008), prior tobacco use.  He was last seen 08/20/20.  Most recent cardiac catheterization in October 2012 with DES to the OM 2, LM without disease, proximal LAD 30%, 2 overlapping stents in the mid LAD, 95% ostial diagonal disease jailed by stents, 95% proximal OM 2 disease, mild RCA plaque.  2012 echocardiogram with LVEF 45-50% with apical hypokinesis, mild LVH, mild LAE.  January 2020 when he presented to [facility] with chest pain shortness of breath  and NSTEMI.  Echo with LVEF 20 to 25%.  12/2019 left heart cath with nonobstructive CAD.  Warfarin was resumed post catheterization but later changed to Xarelto by his primary care provider.  After discharge from his January 2020 catheterization he required additional diuresis and was switched to torsemide.  However he had a creatinine rise to 3.1 along with mild hyperkalemia and torsemide was subsequently held.  He has had to be treated with Kayexalate.  March 2021 underwent DCCV as an outpatient with repeat 04/06/2020 echo with LVEF 25 to 30% when in NSR with rates in the high 40s at the time of echo reported as asymptomatic bradycardia. Underwent peripheral vascular studies for reported LE claudication with work-up showed plaque and medial calcification without segmental stenosis as well as R/L toe brachial indices were abnormal with ankle-brachial indices within normal range.  Overall no evidence of significant lower extremity disease.  Seen 03/2020 by Dr. Rockey Situ with recurrent atrial fib.  Antiarrhythmic deferred due to need to focus on cardiomyopathy.  Imdur and Hydralazine added.  05/29/2020 Imdur increased to 30 mg due to chest pain twinges.  Seen in clinic 06/26/2020 with weight loss of 5 pounds over 1 month, stable DOE/LE edema.   Seen 08/20/20 with weight loss of 7 lbs since last visit. He noted stable dyspnea on exertion. Noted ongoing fatigue since diagnosis of atrial fibrillation earlier this year. He was started on Amiodarone with plans to restore NSR.   Follows with Dr. Clover Mealy of nephrology.   Tells me he took the amiodarone for 2 days and felt markedly nauseous, weak, and dizziness.  He stopped taking it and the symptoms have not recurred.  He does endorse continued cough which is intermittently productive.  Denies fever, chills.  Tells me he had an old prescription of prednisone and he took a tablet yesterday which she thinks has helped him.  Encouraged him to discuss with his primary care  provider.  Reports no shortness of breath.  Reports his dyspnea on exertion is at his baseline since diagnosis of atrial fibrillation and heart failure in January. Continues to note marked fatigue. Reports no chest pain, pressure, or tightness. No edema, orthopnea, PND. Reports no palpitations.   BP at home with readings 110-130/60-70.  HR 70-85.   EKGs/Labs/Other Studies Reviewed:   The following studies were reviewed today:  Vas [place] LE arterial 04/06/20 R/L: Near normal examination. Plaque and medial calcification without  segmental stenosis.    ABIs 04/06/20 R/L: Resting ankle-brachial index is within normal range. No  evidence of significant lower extremity arterial disease. The  toe-brachial index is abnormal.     Echo 04/06/2020  1. Left ventricular ejection fraction, by estimation, is 25 to 30%. The  left ventricle has severely decreased function. The left ventricle has no  regional wall motion abnormalities. The left ventricular internal cavity  size was mildly dilated. There is  severe akinesis of the left ventricular, mid-apical anteroseptal wall,  anterior segment and apical segment.   2. Right ventricular systolic function is normal. The right ventricular  size is normal. Tricuspid regurgitation signal is inadequate for assessing  PA pressure.   3. Left atrial size was mild to moderately dilated.   4. Right atrial size was mildly dilated.   5. The mitral valve is normal in structure. Mild mitral valve  regurgitation. No evidence of mitral stenosis.   6. The aortic valve is normal in structure. Aortic valve regurgitation is  mild. Mild aortic valve sclerosis is present, with no evidence of aortic  valve stenosis.  Comparison(s): Previous Echo (while in A-Fib) showed LV EF 20-25%,  moderate dilatation of LV cavity, global HK, akinesis of the mid-apical  septum, mid-apical inferior and apical myocardium.    [place] 01/20/2020  Previously placed 2nd Mrg stent (unknown type)  is widely patent.  Previously placed Mid LAD stent (unknown type) is widely patent.  Prox LAD to Mid LAD lesion is 25% stenosed.  2nd Diag lesion is 80% stenosed.  LV end diastolic pressure is normal.  There is no aortic valve stenosis. Nonobstructive CAD. Continue medical therapy.  OK to restart warfarin tonight.  Resume IV heparin tonight as well 8 hours post sheath pull. INR in AM.   EKG:  EKG is ordered today.  The ekg ordered today demonstrates atrial fibrillation 71 bpm with known LBBB and occasional PVC versus aberrant conduction..  Stable compared to previous.  Recent Labs: 01/20/2020: ALT 27 01/21/2020: Magnesium 1.8 02/26/2020: Hemoglobin 14.9; Platelets 162 06/26/2020: BNP 88.0 07/02/2020: BUN 61; Creatinine, Ser 2.86; Potassium 4.6; Sodium 138  Recent Lipid Panel    Component Value Date/Time   CHOL 110 01/20/2020 0233   TRIG 153 (H) 01/20/2020 0233   HDL 26 (L) 01/20/2020 0233   CHOLHDL 4.2 01/20/2020 0233   VLDL 31 01/20/2020 0233   LDLCALC 53 01/20/2020 0233    Home Medications   No outpatient medications have been marked as taking for the 09/03/20 encounter (Appointment) with Loel Dubonnet, NP.    Review of Systems    Review of Systems  Constitutional: Positive for malaise/fatigue. Negative for chills and fever.  Cardiovascular: Positive for dyspnea on exertion. Negative for chest pain, irregular heartbeat, leg swelling, near-syncope, orthopnea, palpitations and syncope.  Respiratory: Positive for cough and wheezing. Negative for shortness of breath.   Gastrointestinal: Negative for melena, nausea and vomiting.  Genitourinary: Negative for hematuria.  Neurological: Negative for dizziness, light-headedness and weakness.   All other systems reviewed and are otherwise negative except as noted above.  Physical Exam    VS:  There were no vitals taken for this visit. , BMI There is no height or weight on file to calculate BMI. GEN: Well nourished, well developed,  in no acute distress. HEENT: normal. Neck: Supple, no JVD, carotid bruits, or masses. Cardiac: Irregularly irregular, no murmurs, rubs, or gallops. No clubbing, cyanosis, edema.  Radials/PT 2+ and equal bilaterally.  Respiratory:  Respirations regular and unlabored. Expiratory wheeze noted in bilateral lung fields.  GI: Soft, nontender, nondistended MS: No deformity or atrophy. Skin: Warm and dry, no rash. Neuro:  Strength and sensation are intact. Psych: Normal affect.  Assessment & Plan    1. HFrEF - Echo 03/2020 EF 25-30%. Euvolemic and well compensated on exam. NYHA II-III with dyspnea and fatigue. GDMT includes Coreg, Imdur, hydralazine, torsemide.  Further heart failure therapies limited by CKD (no ACE/ARB/ARNI/MRA). Could consider [place]  in future. Continue low-sodium, heart diet. Educated to report weight gain of 3 pounds overnight or 5 pounds in 1 week.  Reduce Imdur to 15mg  daily due to relative hypotension.   2. Permanent atrial fibrillation/chronic anticoagulation -  Rate controlled on EKG today. Continue Coreg 6.25 mg twice daily.  Continue Xarelto 15 mg daily.  Reduced dose appropriate in the setting of CKD.  Denies bleeding complications. Due to persistent dyspnea and fatigue, will plan for AAD and cardioversion.  He did not tolerate amiodarone loading dose.  We will trial lower dose amiodarone 200 mg daily.  If he does not tolerate low-dose amiodarone we may need to refer to EP for alternative AAD.  3. CAD -  Stable with no anginal symptoms.  Continue GDMT including beta-blocker and statin.  No aspirin secondary to chronic anticoagulation  4. HTN -BP low normal with some lightheadedness.  Reduce Imdur to 50 mg daily.    5. HLD, LDL goal less than 70 -  Continue Crestor 10 mg daily.  6. CKD 4/renal cyst with recommendation for [place] 09/2020 - Continue to follow nephrology.    7. Mild MR/AR - No signs or symptoms of worsening valvular function.  Continue to follow with periodic  echo.  8. LBBB - Stable finding on EKG.   9. Hyperkalemia -in setting of CKD.  He is taking Veltassa once per week per nephrology.  Disposition: Follow up in 3 week(s) with Dr. Rockey Situ or APP for reassessment after initiation of Amiodarone.   Loel Dubonnet, NP 09/03/2020, 7:59 AM

## 2020-09-03 NOTE — Patient Instructions (Signed)
Medication Instructions:  Your physician has recommended you make the following change in your medication:   CHANGE Isosorbide Mononitrate (Imdur) to 15mg  (half tablet) daily.  START Amiodarone 200mg  daily  *If you need a refill on your cardiac medications before your next appointment, please call your pharmacy*   Lab Work: No lab work today.  If you have labs (blood work) drawn today and your tests are completely normal, you will receive your results only by: Marland Kitchen MyChart Message (if you have MyChart) OR . A paper copy in the mail If you have any lab test that is abnormal or we need to change your treatment, we will call you to review the results.   Testing/Procedures: Your EKG today showed rate controlled atrial fibrillation which is a stable finding.   Follow-Up: At [facility], you and your health needs are our priority.  As part of our continuing mission to provide you with exceptional heart care, we have created designated Provider Care Teams.  These Care Teams include your primary Cardiologist (physician) and Advanced Practice Providers (APPs -  Physician Assistants and Nurse Practitioners) who all work together to provide you with the care you need, when you need it.  We recommend signing up for the patient portal called "MyChart".  Sign up information is provided on this After Visit Summary.  MyChart is used to connect with patients for Virtual Visits (Telemedicine).  Patients are able to view lab/test results, encounter notes, upcoming appointments, etc.  Non-urgent messages can be sent to your provider as well.   To learn more about what you can do with MyChart, go to [email or web address].    Your next appointment:   3 week(s)  The format for your next appointment:   In Person  Provider:    You may see Ida Rogue, MD or one of the following Advanced Practice Providers on your designated Care Team:    Murray Hodgkins, NP  Christell Faith, PA-C  Laurann Montana,  NP  Marrianne Mood, PA-C   Other Instructions  Call us if you have difficulties with Amiodarone. We will trial a lower dose to see if you tolerate. We have also reduced your Imdur to prevent low blood pressure.  Recommend you call you primary care about your cough and wheeze.

## 2020-09-17 DIAGNOSIS — R05 Cough: Secondary | ICD-10-CM | POA: Diagnosis not present

## 2020-09-17 DIAGNOSIS — I480 Paroxysmal atrial fibrillation: Secondary | ICD-10-CM | POA: Diagnosis not present

## 2020-09-17 DIAGNOSIS — J90 Pleural effusion, not elsewhere classified: Secondary | ICD-10-CM | POA: Diagnosis not present

## 2020-09-17 DIAGNOSIS — N184 Chronic kidney disease, stage 4 (severe): Secondary | ICD-10-CM | POA: Diagnosis not present

## 2020-09-17 DIAGNOSIS — I5022 Chronic systolic (congestive) heart failure: Secondary | ICD-10-CM | POA: Diagnosis not present

## 2020-09-17 DIAGNOSIS — J9811 Atelectasis: Secondary | ICD-10-CM | POA: Diagnosis not present

## 2020-09-17 DIAGNOSIS — I517 Cardiomegaly: Secondary | ICD-10-CM | POA: Diagnosis not present

## 2020-09-21 DIAGNOSIS — E875 Hyperkalemia: Secondary | ICD-10-CM | POA: Diagnosis not present

## 2020-09-23 ENCOUNTER — Other Ambulatory Visit: Payer: Self-pay | Admitting: Family Medicine

## 2020-09-23 DIAGNOSIS — R05 Cough: Secondary | ICD-10-CM | POA: Diagnosis not present

## 2020-09-23 DIAGNOSIS — R053 Chronic cough: Secondary | ICD-10-CM

## 2020-09-23 DIAGNOSIS — I10 Essential (primary) hypertension: Secondary | ICD-10-CM | POA: Diagnosis not present

## 2020-09-23 DIAGNOSIS — I5022 Chronic systolic (congestive) heart failure: Secondary | ICD-10-CM | POA: Diagnosis not present

## 2020-09-23 DIAGNOSIS — R110 Nausea: Secondary | ICD-10-CM | POA: Diagnosis not present

## 2020-09-25 ENCOUNTER — Ambulatory Visit
Admission: RE | Admit: 2020-09-25 | Discharge: 2020-09-25 | Disposition: A | Discharge status: Home / Self Care | Payer: PPO | Source: Ambulatory Visit | Attending: Family Medicine | Admitting: Family Medicine

## 2020-09-25 ENCOUNTER — Other Ambulatory Visit: Payer: Self-pay

## 2020-09-25 DIAGNOSIS — I700 Atherosclerosis of aorta: Secondary | ICD-10-CM | POA: Insufficient documentation

## 2020-09-25 DIAGNOSIS — J439 Emphysema, unspecified: Secondary | ICD-10-CM | POA: Insufficient documentation

## 2020-09-25 DIAGNOSIS — J90 Pleural effusion, not elsewhere classified: Secondary | ICD-10-CM | POA: Insufficient documentation

## 2020-09-25 DIAGNOSIS — I2510 Atherosclerotic heart disease of native coronary artery without angina pectoris: Secondary | ICD-10-CM | POA: Diagnosis not present

## 2020-09-25 DIAGNOSIS — R053 Chronic cough: Secondary | ICD-10-CM | POA: Diagnosis not present

## 2020-09-28 NOTE — Progress Notes (Signed)
Cardiology Office Note  Date:  09/29/2020   ID:  [name] [name], DOB [birth year], MRN [id number]  PCP:  [name], MD   Chief Complaint  Patient presents with  . other    6 month follow up. Meds reviewed by the pt. verbally. Pt. c/o shortness of breath, LE edema and a persistent cough x 1 month; was taking antibiotics for 7 days.      HPI:  [name] [name] is a pleasant 86 y.o. gentleman with a history of  Smoking, stopped in 1969 coronary artery disease,  prior stenting to his mid LAD,  in 2006,  anterior ST elevation MI December 2008 secondary to ISR hypertension,  hyperlipidemia,  chronic kidney disease stage III admitted to the hospital October 21, 2011 with chest pain cardiac cardiac catheterization  with PCI of the OM 2.,  Ejection fraction 45 to 50% Chest pain shortness of breath January 2021, ejection fraction at that time 20 to 25%  catheterization January 20 2020 with nonobstructive disease He presents today for follow-up of his coronary artery disease, atrial fib  Cough "PNA" one month Finished short coarse of ABX through PMD Scheduled to see Dr. Caryl Comes 10/12 Despite antibiotics reports he continues to have heavy cough  CR 2.8 in July 2021 Sees Dr. Lyda Kalata  CT scan performed, images pulled up and reviewed with him today Pleural effusion on the right, moderate in size  Lab work reviewed HBA1c 6.0 Total chol 93 CR 2.9, BUN 88  Sometimes taking 2 torsemide a day Other days taking torsemide 20 daily  Remains in atrial fibrillation today, Is on amiodarone daily  EKG personally reviewed by myself on todays visit Shows atrial fibrillation, left bundle branch block rate 82 bpm  Past medical history reviewed Seen in the hospital for chest pain shortness of breath January 2021, Cath 12/2019  nonobstructive disease Ejection fraction 20 to 25%, Noted to be in atrial fibrillation  Cardioversion 3/11 as outpatient Repeat echocardiogram April 06, 2020 still  ejection fraction 25-30 % He was in normal sinus rhythm rates in the high 40s at the time of his echo  Presented to the ED on 03/06/2019 after seeing Dr. Ramonita Lab and his troponin lab showed 0.03. Repeat again 0.03 The ED did not find anything of note  And he was discharged home  For further work-up, he had a stress test on 03/08/2019 This showed predominantly fixed defects in regions of old MI  Results discussed with him in detail today  Ejection fraction 34%   echocardiogram  [facility] ejection fraction 35% presumably but not specifically detailed.  Moderate AI   OTHER PAST MEDICAL HISTORY REVIEWED BY ME FOR TODAY'S VISIT: Left total knee replacement surgery in July 2019.   Hospital admission 08/04/2015 for GI discomfort, chest pain Negative cardiac enzymes,  Acute on chronic bronchitis, treated with ABX  Cough with ACE inhibitors  Echocardiogram 08/04/2015 showing ejection fraction 45-50% with apical hypokinesis  Stress test at the end of August 2016 showing fixed defect, no ischemia, old anterior MI   Prior cardiac catheterization October 2012 showed left main shows no disease, LAD has 30% proximal disease, 2 overlapping stents in the mid region, 95% ostial diagonal disease jailed by the stents, 95% proximal OM 2 disease, mild plaque in the RCA, hypokinesis of the anterolateral wall, apical region and inferoapical wall with ejection fraction 30-35%.   PMH:   has a past medical history of Arthritis, Asymptomatic Sinus bradycardia, CAD (coronary artery disease), CKD (chronic kidney  disease), stage III ([place]), HFrEF (heart failure with reduced ejection fraction) ([place]), Hyperlipidemia, Hypertension, Ischemic cardiomyopathy, LBBB (left bundle branch block), Myocardial infarction [facility]), Persistent atrial fibrillation ([place]), and Tobacco user.  PSH:    Past Surgical History:  Procedure Laterality Date  . CARDIAC CATHETERIZATION  10/21/2011   s/p stent placement  . CARDIAC  CATHETERIZATION  2008   stent placement  . CARDIAC CATHETERIZATION  2006   stent placement  . CARDIOVERSION N/A 03/05/2020   Procedure: CARDIOVERSION;  Surgeon: Minna Merritts, MD;  Location: [facility] OR[facility];  Service: Cardiovascular;  Laterality: N/A;  . CARPAL TUNNEL RELEASE Right 07/22/2016   Procedure: CARPAL TUNNEL RELEASE;  Surgeon: Leanor Kail, MD;  Location: [facility] OR[facility];  Service: Orthopedics;  Laterality: Right;  . JOINT REPLACEMENT    . LEFT HEART CATH AND CORONARY ANGIOGRAPHY N/A 01/20/2020   Procedure: LEFT HEART CATH AND CORONARY ANGIOGRAPHY;  Surgeon: Jettie Booze, MD;  Location: [place] [facility];  Service: Cardiovascular;  Laterality: N/A;  . TOTAL HIP ARTHROPLASTY     Right  . TOTAL KNEE ARTHROPLASTY Left 06/29/2018   Procedure: LEFT TOTAL KNEE ARTHROPLASTY;  Surgeon: Frederik Pear, MD;  Location: [place];  Service: Orthopedics;  Laterality: Left;  . TRANSURETHRAL RESECTION OF PROSTATE      Current Outpatient Medications  Medication Sig Dispense Refill  . allopurinol (ZYLOPRIM) 100 MG tablet daily.    Marland Kitchen amiodarone (PACERONE) 200 MG tablet Take 1 tablet (200 mg total) by mouth daily. 90 tablet 0  . carvedilol (COREG) 6.25 MG tablet Take 1 tablet (6.25 mg total) by mouth 2 (two) times daily with a meal. 180 tablet 3  . hydrALAZINE (APRESOLINE) 10 MG tablet Take 1 tablet (10 mg total) by mouth 3 (three) times daily. 270 tablet 1  . isosorbide mononitrate (IMDUR) 30 MG 24 hr tablet Take 0.5 tablets (15 mg total) by mouth daily. 90 tablet 1  . Multiple Vitamin (MULTIVITAMIN WITH MINERALS) TABS tablet Take 2 tablets by mouth daily.    . nitroGLYCERIN (NITROSTAT) 0.4 MG SL tablet Place 0.4 mg under the tongue every 5 (five) minutes as needed for chest pain.    . Patiromer Sorbitex Calcium (VELTASSA PO) Take 1 Package by mouth once a week.    . Probiotic CAPS Take 1 capsule by mouth daily.    . Rivaroxaban (XARELTO) 15 MG TABS tablet Take 15 mg by mouth daily with supper.    .  rosuvastatin (CRESTOR) 10 MG tablet Take 1 tablet (10 mg total) by mouth daily. 90 tablet 1  . torsemide (DEMADEX) 20 MG tablet Take 1 tablet (20 mg total) by mouth daily.    Marland Kitchen triamcinolone cream (KENALOG) 0.1 % Apply 1 application topically daily as needed (itching).     No current facility-administered medications for this visit.    Allergies:   Penicillins   Social History:  The patient  reports that he quit smoking about 51 years ago. His smoking use included cigarettes. He has a 20.00 pack-year smoking history. He has never used smokeless tobacco. He reports that he does not drink alcohol and does not use drugs.   Family History:   family history includes Heart disease in his father; Parkinson's disease in his mother.   Review of Systems: Review of Systems  Constitutional: Negative.   HENT: Negative.   Eyes: Negative.   Respiratory: Negative.  Shortness of breath: exertion.   Cardiovascular: Negative.   Gastrointestinal: Negative.   Genitourinary: Negative.   Musculoskeletal: Negative.  Neurological: Negative.   Psychiatric/Behavioral: Negative.   All other systems reviewed and are negative.   PHYSICAL EXAM: VS:  BP (!) 103/59 (BP Location: Left Arm, Patient Position: Sitting, Cuff Size: Normal)   Pulse 82   Temp (!) 97.4 F (36.3 C)   Ht 5\' 5"  (1.651 m)   Wt 202 lb 6 oz (91.8 kg)   SpO2 96%   BMI 33.68 kg/m  , BMI Body mass index is 33.68 kg/m.  Constitutional:  oriented to person, place, and time. No distress.  HENT:  Head: Grossly normal Eyes:  no discharge. No scleral icterus.  Neck: No JVD, no carotid bruits  Cardiovascular: Irregularly irregular, no murmurs appreciated Trace lower extremity edema bilaterally Pulmonary/Chest: Clear to auscultation bilaterally, no wheezes or rails Abdominal: Soft.  no distension.  no tenderness.  Musculoskeletal: Normal range of motion Neurological:  normal muscle tone. Coordination normal. No atrophy Skin: Skin warm and  dry Psychiatric: normal affect, pleasant  Recent Labs: 01/20/2020: ALT 27 01/21/2020: Magnesium 1.8 02/26/2020: Hemoglobin 14.9; Platelets 162 06/26/2020: BNP 88.0 07/02/2020: BUN 61; Creatinine, Ser 2.86; Potassium 4.6; Sodium 138    Lipid Panel Lab Results  Component Value Date   CHOL 110 01/20/2020   HDL 26 (L) 01/20/2020   LDLCALC 53 01/20/2020   TRIG 153 (H) 01/20/2020    Wt Readings from Last 3 Encounters:  09/29/20 202 lb 6 oz (91.8 kg)  09/03/20 206 lb 2 oz (93.5 kg)  08/20/20 205 lb (93 kg)     ASSESSMENT AND PLAN:  Atrial fib, persistent On carvedilol, xarelto On amiodarone, started on his last clinic visit Plan was for cardioversion, with his current lung condition not a candidate for cardioversion May need more antibiotics, scheduled to see Dr. Caryl Comes on 12 October We will set him up for thoracentesis given moderate sized effusion likely contributing to his cough Reassessment in several weeks time for consideration of cardioversion  Atherosclerosis of native coronary artery with stable angina pectoris,  native(HCC)  Recent catheterization  nonischemic cardiomyopathy No further work-up at this time  Hypertension Continue current medications  Cardiomyopathy Markedly depressed ejection fraction 20 to 25% in January now 25 to 30% in sinus rhythm Still in atrial fibrillation Worsening heart failure symptoms, pleural effusion in the setting of renal failure We will continue his current medications Not a candidate for ACE ARB/Entresto On torsemide 20 daily, occasional torsemide 20 twice a day Still with pleural effusion, heart failure symptoms Thoracentesis as above  Mixed hyperlipidemia  Goal LDL less than 70 Numbers at goal  Chronic renal failure, stage 3 (moderate)  Renal numbers getting worse Needs close follow-up with nephrology, [facility]  Shortness of breath Pleural effusion, underlying lung disease, bronchitis Will schedule for thoracentesis   Total  encounter time more than 25 minutes  Greater than 50% was spent in counseling and coordination of care with the patient   No orders of the defined types were placed in this encounter.  Signed, Esmond Plants, M.D., Ph.D. 09/29/2020  [place], [place]

## 2020-09-29 ENCOUNTER — Ambulatory Visit
Admission: RE | Admit: 2020-09-29 | Discharge: 2020-09-29 | Disposition: A | Discharge status: Home / Self Care | Payer: PPO | Source: Ambulatory Visit | Attending: Radiology | Admitting: Radiology

## 2020-09-29 ENCOUNTER — Other Ambulatory Visit: Payer: Self-pay | Admitting: Cardiovascular Disease

## 2020-09-29 ENCOUNTER — Ambulatory Visit: Payer: PPO | Admitting: Cardiovascular Disease

## 2020-09-29 ENCOUNTER — Other Ambulatory Visit
Admission: RE | Admit: 2020-09-29 | Discharge: 2020-09-29 | Disposition: A | Discharge status: Home / Self Care | Payer: PPO | Source: Ambulatory Visit | Attending: Cardiovascular Disease | Admitting: Cardiovascular Disease

## 2020-09-29 ENCOUNTER — Other Ambulatory Visit: Payer: Self-pay | Admitting: Radiology

## 2020-09-29 ENCOUNTER — Encounter: Payer: Self-pay | Admitting: Cardiovascular Disease

## 2020-09-29 ENCOUNTER — Other Ambulatory Visit: Payer: Self-pay

## 2020-09-29 ENCOUNTER — Ambulatory Visit
Admission: RE | Admit: 2020-09-29 | Discharge: 2020-09-29 | Disposition: A | Discharge status: Home / Self Care | Payer: PPO | Source: Ambulatory Visit | Attending: Cardiovascular Disease | Admitting: Cardiovascular Disease

## 2020-09-29 VITALS — BP 103/59 | HR 82 | Temp 97.4°F | Ht 65.0 in | Wt 202.4 lb

## 2020-09-29 DIAGNOSIS — N184 Chronic kidney disease, stage 4 (severe): Secondary | ICD-10-CM

## 2020-09-29 DIAGNOSIS — Z20822 Contact with and (suspected) exposure to covid-19: Secondary | ICD-10-CM | POA: Insufficient documentation

## 2020-09-29 DIAGNOSIS — I4821 Permanent atrial fibrillation: Secondary | ICD-10-CM | POA: Insufficient documentation

## 2020-09-29 DIAGNOSIS — I25118 Atherosclerotic heart disease of native coronary artery with other forms of angina pectoris: Secondary | ICD-10-CM

## 2020-09-29 DIAGNOSIS — E785 Hyperlipidemia, unspecified: Secondary | ICD-10-CM

## 2020-09-29 DIAGNOSIS — Z01812 Encounter for preprocedural laboratory examination: Secondary | ICD-10-CM | POA: Diagnosis not present

## 2020-09-29 DIAGNOSIS — J90 Pleural effusion, not elsewhere classified: Secondary | ICD-10-CM

## 2020-09-29 DIAGNOSIS — Z8701 Personal history of pneumonia (recurrent): Secondary | ICD-10-CM | POA: Insufficient documentation

## 2020-09-29 DIAGNOSIS — Z9889 Other specified postprocedural states: Secondary | ICD-10-CM

## 2020-09-29 DIAGNOSIS — J910 Malignant pleural effusion: Secondary | ICD-10-CM | POA: Diagnosis not present

## 2020-09-29 DIAGNOSIS — I5020 Unspecified systolic (congestive) heart failure: Secondary | ICD-10-CM

## 2020-09-29 DIAGNOSIS — I10 Essential (primary) hypertension: Secondary | ICD-10-CM

## 2020-09-29 LAB — AMYLASE, PLEURAL OR PERITONEAL FLUID: Amylase, Fluid: 583 U/L

## 2020-09-29 LAB — CBC
HCT: 43.3 % (ref 39.0–52.0)
Hemoglobin: 14.1 g/dL (ref 13.0–17.0)
MCH: 33.1 pg (ref 26.0–34.0)
MCHC: 32.6 g/dL (ref 30.0–36.0)
MCV: 101.6 fL — ABNORMAL HIGH (ref 80.0–100.0)
Platelets: 130 10*3/uL — ABNORMAL LOW (ref 150–400)
RBC: 4.26 MIL/uL (ref 4.22–5.81)
RDW: 15.2 % (ref 11.5–15.5)
WBC: 8.4 10*3/uL (ref 4.0–10.5)
nRBC: 0 % (ref 0.0–0.2)

## 2020-09-29 LAB — PROTEIN, PLEURAL OR PERITONEAL FLUID: Total protein, fluid: 3.4 g/dL

## 2020-09-29 LAB — BASIC METABOLIC PANEL
Anion gap: 13 (ref 5–15)
BUN: 92 mg/dL — ABNORMAL HIGH (ref 8–23)
CO2: 29 mmol/L (ref 22–32)
Calcium: 9.3 mg/dL (ref 8.9–10.3)
Chloride: 93 mmol/L — ABNORMAL LOW (ref 98–111)
Creatinine, Ser: 3.1 mg/dL — ABNORMAL HIGH (ref 0.61–1.24)
GFR calc non Af Amer: 17 mL/min — ABNORMAL LOW (ref >60)
Glucose, Bld: 108 mg/dL — ABNORMAL HIGH (ref 70–99)
Potassium: 4.6 mmol/L (ref 3.5–5.1)
Sodium: 135 mmol/L (ref 135–145)

## 2020-09-29 LAB — ALBUMIN, PLEURAL OR PERITONEAL FLUID: Albumin, Fluid: 2.3 g/dL

## 2020-09-29 LAB — BODY FLUID CELL COUNT WITH DIFFERENTIAL
Eos, Fluid: 0 %
Lymphs, Fluid: 25 %
Monocyte-Macrophage-Serous Fluid: 71 %
Neutrophil Count, Fluid: 4 %
Total Nucleated Cell Count, Fluid: 1001 cu mm

## 2020-09-29 LAB — SARS CORONAVIRUS 2 BY RT PCR (HOSPITAL ORDER, PERFORMED IN ~~LOC~~ HOSPITAL LAB): SARS Coronavirus 2: NEGATIVE

## 2020-09-29 MED ORDER — TORSEMIDE 20 MG PO TABS: 20.0000 mg | ORAL_TABLET | ORAL | 3 refills | Status: DC

## 2020-09-29 NOTE — Procedures (Signed)
PROCEDURE SUMMARY:  Successful US guided right thoracentesis. Yielded 1.3 L of clear amber fluid. Pt tolerated procedure well. No immediate complications.  Specimen was sent for labs. CXR ordered.  EBL < 5 mL  Ascencion Dike PA-C 09/29/2020 2:35 PM

## 2020-09-29 NOTE — Patient Instructions (Addendum)
We will set up a thoracentesis for right pleural effusion Scheduled today at 2:30 PM Patient will need CBC & BMP  Also a rapid COVID test today.  Will take patient upstairs to get testing, labs, then take him to registration to check in and then send to   Medication Instructions:  Torsemide 20 twice a day alternating with torsemide 20 daily    If you need a refill on your cardiac medications before your next appointment, please call your pharmacy.    Lab work: No new labs needed   If you have labs (blood work) drawn today and your tests are completely normal, you will receive your results only by: Marland Kitchen MyChart Message (if you have MyChart) OR . A paper copy in the mail If you have any lab test that is abnormal or we need to change your treatment, we will call you to review the results.   Testing/Procedures: No new testing needed   Follow-Up: At [facility], you and your health needs are our priority.  As part of our continuing mission to provide you with exceptional heart care, we have created designated Provider Care Teams.  These Care Teams include your primary Cardiologist (physician) and Advanced Practice Providers (APPs -  Physician Assistants and Nurse Practitioners) who all work together to provide you with the care you need, when you need it.  . You will need a follow up appointment in 1 month  . Providers on your designated Care Team:   . Murray Hodgkins, NP . Christell Faith, PA-C . Marrianne Mood, PA-C  Any Other Special Instructions Will Be Listed Below (If Applicable).  COVID-19 Vaccine Information can be found at: [email or web address].uk For questions related to vaccine distribution or appointments, please email [email or web address] or call 8011406485.

## 2020-10-01 LAB — COMP PANEL: LEUKEMIA/LYMPHOMA

## 2020-10-02 ENCOUNTER — Other Ambulatory Visit: Payer: Self-pay | Admitting: Cardiovascular Disease

## 2020-10-02 ENCOUNTER — Other Ambulatory Visit: Payer: Self-pay | Admitting: *Deleted

## 2020-10-02 DIAGNOSIS — J90 Pleural effusion, not elsewhere classified: Secondary | ICD-10-CM

## 2020-10-02 LAB — CYTOLOGY - NON PAP

## 2020-10-02 LAB — GRAM STAIN: Gram Stain: NONE SEEN

## 2020-10-06 DIAGNOSIS — I77811 Abdominal aortic ectasia: Secondary | ICD-10-CM | POA: Diagnosis not present

## 2020-10-06 DIAGNOSIS — I5022 Chronic systolic (congestive) heart failure: Secondary | ICD-10-CM | POA: Diagnosis not present

## 2020-10-06 DIAGNOSIS — M810 Age-related osteoporosis without current pathological fracture: Secondary | ICD-10-CM | POA: Diagnosis not present

## 2020-10-06 DIAGNOSIS — N179 Acute kidney failure, unspecified: Secondary | ICD-10-CM | POA: Diagnosis not present

## 2020-10-06 DIAGNOSIS — I10 Essential (primary) hypertension: Secondary | ICD-10-CM | POA: Diagnosis not present

## 2020-10-06 DIAGNOSIS — J9811 Atelectasis: Secondary | ICD-10-CM | POA: Diagnosis not present

## 2020-10-06 DIAGNOSIS — D692 Other nonthrombocytopenic purpura: Secondary | ICD-10-CM | POA: Diagnosis not present

## 2020-10-06 DIAGNOSIS — J90 Pleural effusion, not elsewhere classified: Secondary | ICD-10-CM | POA: Diagnosis not present

## 2020-10-06 DIAGNOSIS — N1832 Chronic kidney disease, stage 3b: Secondary | ICD-10-CM | POA: Diagnosis not present

## 2020-10-06 DIAGNOSIS — I480 Paroxysmal atrial fibrillation: Secondary | ICD-10-CM | POA: Diagnosis not present

## 2020-10-06 DIAGNOSIS — I25118 Atherosclerotic heart disease of native coronary artery with other forms of angina pectoris: Secondary | ICD-10-CM | POA: Diagnosis not present

## 2020-10-07 ENCOUNTER — Encounter (INDEPENDENT_AMBULATORY_CARE_PROVIDER_SITE_OTHER): Payer: Self-pay

## 2020-10-07 ENCOUNTER — Inpatient Hospital Stay: Payer: PPO

## 2020-10-07 ENCOUNTER — Inpatient Hospital Stay: Payer: PPO | Attending: Oncology | Admitting: Oncology

## 2020-10-07 ENCOUNTER — Other Ambulatory Visit: Payer: Self-pay

## 2020-10-07 ENCOUNTER — Encounter: Payer: Self-pay | Admitting: Oncology

## 2020-10-07 VITALS — BP 113/66 | HR 60 | Temp 97.7°F | Resp 20 | Wt 200.2 lb

## 2020-10-07 DIAGNOSIS — N184 Chronic kidney disease, stage 4 (severe): Secondary | ICD-10-CM | POA: Diagnosis not present

## 2020-10-07 DIAGNOSIS — Z7901 Long term (current) use of anticoagulants: Secondary | ICD-10-CM | POA: Diagnosis not present

## 2020-10-07 DIAGNOSIS — Z9181 History of falling: Secondary | ICD-10-CM | POA: Insufficient documentation

## 2020-10-07 DIAGNOSIS — J910 Malignant pleural effusion: Secondary | ICD-10-CM | POA: Diagnosis not present

## 2020-10-07 DIAGNOSIS — Z8041 Family history of malignant neoplasm of ovary: Secondary | ICD-10-CM

## 2020-10-07 DIAGNOSIS — R110 Nausea: Secondary | ICD-10-CM | POA: Insufficient documentation

## 2020-10-07 DIAGNOSIS — Z7189 Other specified counseling: Secondary | ICD-10-CM | POA: Diagnosis not present

## 2020-10-07 DIAGNOSIS — C801 Malignant (primary) neoplasm, unspecified: Secondary | ICD-10-CM

## 2020-10-07 DIAGNOSIS — I255 Ischemic cardiomyopathy: Secondary | ICD-10-CM | POA: Insufficient documentation

## 2020-10-07 DIAGNOSIS — Z5189 Encounter for other specified aftercare: Secondary | ICD-10-CM

## 2020-10-07 DIAGNOSIS — J90 Pleural effusion, not elsewhere classified: Secondary | ICD-10-CM

## 2020-10-07 DIAGNOSIS — K5900 Constipation, unspecified: Secondary | ICD-10-CM | POA: Insufficient documentation

## 2020-10-07 DIAGNOSIS — Z87891 Personal history of nicotine dependence: Secondary | ICD-10-CM | POA: Diagnosis not present

## 2020-10-07 DIAGNOSIS — I4891 Unspecified atrial fibrillation: Secondary | ICD-10-CM | POA: Insufficient documentation

## 2020-10-07 DIAGNOSIS — C3490 Malignant neoplasm of unspecified part of unspecified bronchus or lung: Secondary | ICD-10-CM | POA: Insufficient documentation

## 2020-10-07 DIAGNOSIS — R059 Cough, unspecified: Secondary | ICD-10-CM | POA: Diagnosis not present

## 2020-10-07 DIAGNOSIS — I2510 Atherosclerotic heart disease of native coronary artery without angina pectoris: Secondary | ICD-10-CM | POA: Insufficient documentation

## 2020-10-07 DIAGNOSIS — C3491 Malignant neoplasm of unspecified part of right bronchus or lung: Secondary | ICD-10-CM | POA: Insufficient documentation

## 2020-10-07 LAB — COMPREHENSIVE METABOLIC PANEL
ALT: 20 U/L (ref 0–44)
AST: 27 U/L (ref 15–41)
Albumin: 3.3 g/dL — ABNORMAL LOW (ref 3.5–5.0)
Alkaline Phosphatase: 103 U/L (ref 38–126)
Anion gap: 10 (ref 5–15)
BUN: 81 mg/dL — ABNORMAL HIGH (ref 8–23)
CO2: 29 mmol/L (ref 22–32)
Calcium: 8.3 mg/dL — ABNORMAL LOW (ref 8.9–10.3)
Chloride: 95 mmol/L — ABNORMAL LOW (ref 98–111)
Creatinine, Ser: 3.27 mg/dL — ABNORMAL HIGH (ref 0.61–1.24)
GFR, Estimated: 16 mL/min — ABNORMAL LOW (ref >60)
Glucose, Bld: 129 mg/dL — ABNORMAL HIGH (ref 70–99)
Potassium: 5.2 mmol/L — ABNORMAL HIGH (ref 3.5–5.1)
Sodium: 134 mmol/L — ABNORMAL LOW (ref 135–145)
Total Bilirubin: 1 mg/dL (ref 0.3–1.2)
Total Protein: 6.5 g/dL (ref 6.5–8.1)

## 2020-10-07 LAB — CBC WITH DIFFERENTIAL/PLATELET
Abs Immature Granulocytes: 0.06 10*3/uL (ref 0.00–0.07)
Basophils Absolute: 0 10*3/uL (ref 0.0–0.1)
Basophils Relative: 0 %
Eosinophils Absolute: 0.1 10*3/uL (ref 0.0–0.5)
Eosinophils Relative: 1 %
HCT: 42.8 % (ref 39.0–52.0)
Hemoglobin: 14.3 g/dL (ref 13.0–17.0)
Immature Granulocytes: 1 %
Lymphocytes Relative: 21 %
Lymphs Abs: 1.5 10*3/uL (ref 0.7–4.0)
MCH: 33.3 pg (ref 26.0–34.0)
MCHC: 33.4 g/dL (ref 30.0–36.0)
MCV: 99.5 fL (ref 80.0–100.0)
Monocytes Absolute: 0.4 10*3/uL (ref 0.1–1.0)
Monocytes Relative: 5 %
Neutro Abs: 5.2 10*3/uL (ref 1.7–7.7)
Neutrophils Relative %: 72 %
Platelets: 121 10*3/uL — ABNORMAL LOW (ref 150–400)
RBC: 4.3 MIL/uL (ref 4.22–5.81)
RDW: 15.2 % (ref 11.5–15.5)
WBC: 7.2 10*3/uL (ref 4.0–10.5)
nRBC: 0.3 % — ABNORMAL HIGH (ref 0.0–0.2)

## 2020-10-07 LAB — LACTATE DEHYDROGENASE: LDH: 205 U/L — ABNORMAL HIGH (ref 98–192)

## 2020-10-07 MED ORDER — GUAIFENESIN-CODEINE 100-10 MG/5ML PO SYRP
5.0000 mL | ORAL_SOLUTION | Freq: Three times a day (TID) | ORAL | 0 refills | Status: DC | PRN
Start: 2020-10-07 — End: 2020-10-23

## 2020-10-07 NOTE — Progress Notes (Signed)
Hematology/Oncology Consult note [facility] Telephone:([phone] [phone]   Patient Care Team: [name], MD as PCP - General (Internal Medicine) Minna Merritts, MD as PCP - Cardiology (Cardiology) Minna Merritts, MD as Consulting Physician (Cardiology)  REFERRING PROVIDER: Minna Merritts, MD  CHIEF COMPLAINTS/REASON FOR VISIT:  Evaluation of pleural effusion  HISTORY OF PRESENTING ILLNESS:   [name] [name] is a  86 y.o.  male with PMH listed below was seen in consultation at the request of  Rockey Situ, Kathlene November, MD  for evaluation of pleural effusion. Patient reports persistent cough for 1 month, shortness of breath and cough did not improve after taking 7 days of antibiotics.  Patient has a history of anterior ST elevation MI in 2008, cardiac cath with PCI, atrial fibrillation. Baseline ejection fraction 25 to 30%.  He follows up with cardiology Dr. Rockey Situ and was seen by cardiology recently. 09/25/2020, CT chest without contrast was obtained for evaluation of productive cough. CT showed moderate right pleural effusion, right lower lobe subsegmental atelectasis.  Emphysema 09/29/2020, patient underwent right thoracentesis with removal of 1.3 L of pleural fluid. Fluid cytology is positive for malignancy.  Adenocarcinoma compatible with lung origin. Flow cytometry showed T cells with no detectable surface kappa or lambda light chain expression. Patient has had weight loss, decreased appetite.  He also feels nausea and reports difficulty swallowing. He lives with wife.  Review of Systems  Constitutional: Positive for appetite change, fatigue and unexpected weight change. Negative for chills and fever.  HENT:   Negative for hearing loss and voice change.   Eyes: Negative for eye problems and icterus.  Respiratory: Positive for cough and shortness of breath. Negative for chest tightness.   Cardiovascular: Negative for chest pain and leg  swelling.  Gastrointestinal: Positive for nausea. Negative for abdominal distention and abdominal pain.  Endocrine: Negative for hot flashes.  Genitourinary: Negative for difficulty urinating, dysuria and frequency.   Musculoskeletal: Negative for arthralgias.  Skin: Negative for itching and rash.  Neurological: Negative for light-headedness and numbness.  Hematological: Negative for adenopathy. Does not bruise/bleed easily.  Psychiatric/Behavioral: Negative for confusion.    MEDICAL HISTORY:  Past Medical History:  Diagnosis Date   Arthritis    Asymptomatic Sinus bradycardia    CAD (coronary artery disease)    a. 10/2005 NSTEMI/PCI: LAD 95 (2.75x33 Cypher DES); b. 2008 Ant STEMI/PCI LAD 2/2 ISR; c. 09/2011 PCI OM2; d. 02/2019 MV: Fixed mid-dist ant/apical, mid-apical inferolateral dfects w/o ischemia; e. 12/2019 Cath: LM nl, LAD 25p/m, patent stent, D2 80, LCX nl, OM2 patent stent, RCA min irregs.   CKD (chronic kidney disease), stage III (HCC)    HFrEF (heart failure with reduced ejection fraction) ([place])    a. 12/2019 Echo: EF 20-25%, glob HK. Mid-apical septum, inf, apical AK. Mildly reduced RV fxn. Triv MR.   Hyperlipidemia    Hypertension    Ischemic cardiomyopathy    a. 07/2015 Echo: EF 45-50%; b. 12/2019 Echo: EF 20-25%.   LBBB (left bundle branch block)    Myocardial infarction ([place])    Persistent atrial fibrillation ([place])    a.  Diagnosed January 2021.  CHA2DS2VASc = 5--> xarelto.   Tobacco user    Remote    SURGICAL HISTORY: Past Surgical History:  Procedure Laterality Date   CARDIAC CATHETERIZATION  10/21/2011   s/p stent placement   CARDIAC CATHETERIZATION  2008   stent placement   CARDIAC CATHETERIZATION  2006   stent placement  CARDIOVERSION N/A 03/05/2020   Procedure: CARDIOVERSION;  Surgeon: Minna Merritts, MD;  Location: [facility];  Service: Cardiovascular;  Laterality: N/A;   CARPAL TUNNEL RELEASE Right 07/22/2016   Procedure: CARPAL TUNNEL  RELEASE;  Surgeon: Leanor Kail, MD;  Location: [facility];  Service: Orthopedics;  Laterality: Right;   JOINT REPLACEMENT     LEFT HEART CATH AND CORONARY ANGIOGRAPHY N/A 01/20/2020   Procedure: LEFT HEART CATH AND CORONARY ANGIOGRAPHY;  Surgeon: Jettie Booze, MD;  Location: [place] [facility];  Service: Cardiovascular;  Laterality: N/A;   TOTAL HIP ARTHROPLASTY     Right   TOTAL KNEE ARTHROPLASTY Left 06/29/2018   Procedure: LEFT TOTAL KNEE ARTHROPLASTY;  Surgeon: Frederik Pear, MD;  Location: [place];  Service: Orthopedics;  Laterality: Left;   TRANSURETHRAL RESECTION OF PROSTATE      SOCIAL HISTORY: Social History   Socioeconomic History   Marital status: Married    Spouse name: Not on file   Number of children: Not on file   Years of education: Not on file   Highest education level: Not on file  Occupational History   Occupation: Retired    Fish farm manager: [employer]  Tobacco Use   Smoking status: Former Smoker    Packs/day: 1.00    Years: 20.00    Pack years: 20.00    Types: Cigarettes    Quit date: 12/06/1968    Years since quitting: 51.8   Smokeless tobacco: Never Used  Vaping Use   Vaping Use: Never used  Substance and Sexual Activity   Alcohol use: No   Drug use: No   Sexual activity: Not on file  Other Topics Concern   Not on file  Social History Narrative   Married   Lives in [place] with his wife   Social Determinants of Health   Financial Resource Strain:    Difficulty of Paying Living Expenses: Not on file  Food Insecurity:    Worried About Charity fundraiser in the Last Year: Not on file   [place] in the Last Year: Not on file  Transportation Needs:    Lack of Transportation (Medical): Not on file   Lack of Transportation (Non-Medical): Not on file  Physical Activity:    Days of Exercise per Week: Not on file   Minutes of Exercise per Session: Not on file  Stress:    Feeling of Stress : Not on file  Social  Connections:    Frequency of Communication with Friends and Family: Not on file   Frequency of Social Gatherings with Friends and Family: Not on file   Attends Religious Services: Not on file   Active Member of Clubs or Organizations: Not on file   Attends Archivist Meetings: Not on file   Marital Status: Not on file  Intimate Partner Violence:    Fear of Current or Ex-Partner: Not on file   Emotionally Abused: Not on file   Physically Abused: Not on file   Sexually Abused: Not on file    FAMILY HISTORY: Family History  Problem Relation Age of Onset   Parkinson's disease Mother    Heart disease Father    Ovarian cancer Sister     ALLERGIES:  is allergic to penicillins.  MEDICATIONS:  Current Outpatient Medications  Medication Sig Dispense Refill   allopurinol (ZYLOPRIM) 100 MG tablet daily.     amiodarone (PACERONE) 200 MG tablet Take 1 tablet (200 mg total) by mouth daily. [place]  tablet 0   carvedilol (COREG) 6.25 MG tablet Take 1 tablet (6.25 mg total) by mouth 2 (two) times daily with a meal. 180 tablet 3   hydrALAZINE (APRESOLINE) 10 MG tablet Take 1 tablet (10 mg total) by mouth 3 (three) times daily. (Patient taking differently: Take 10 mg by mouth daily. ) 270 tablet 1   isosorbide mononitrate (IMDUR) 30 MG 24 hr tablet Take 0.5 tablets (15 mg total) by mouth daily. 90 tablet 1   Multiple Vitamin (MULTIVITAMIN WITH MINERALS) TABS tablet Take 2 tablets by mouth daily.     nitroGLYCERIN (NITROSTAT) 0.4 MG SL tablet Place 0.4 mg under the tongue every 5 (five) minutes as needed for chest pain.     pantoprazole (PROTONIX) 40 MG tablet Take by mouth.     Probiotic CAPS Take 1 capsule by mouth daily.     Rivaroxaban (XARELTO) 15 MG TABS tablet Take 15 mg by mouth daily with supper.     rosuvastatin (CRESTOR) 10 MG tablet Take 1 tablet (10 mg total) by mouth daily. 90 tablet 1   torsemide (DEMADEX) 20 MG tablet Take 1 tablet (20 mg total) by mouth  as directed. Take 20 twice a day alternating with 20 once a day. 180 tablet 3   triamcinolone cream (KENALOG) 0.1 % Apply 1 application topically daily as needed (itching).     guaiFENesin-codeine (ROBITUSSIN AC) 100-10 MG/5ML syrup Take 5 mLs by mouth 3 (three) times daily as needed for cough. 120 mL 0   Patiromer Sorbitex Calcium (VELTASSA PO) Take 1 Package by mouth once a week. (Patient not taking: Reported on 10/07/2020)     No current facility-administered medications for this visit.     PHYSICAL EXAMINATION: ECOG PERFORMANCE STATUS: 3 - Symptomatic, >50% confined to bed Vitals:   10/07/20 1050  BP: 113/66  Pulse: 60  Resp: 20  Temp: 97.7 F (36.5 C)  SpO2: 96%   Filed Weights   10/07/20 1050  Weight: 200 lb 3.2 oz (90.8 kg)    Physical Exam Constitutional:      General: He is not in acute distress. HENT:     Head: Normocephalic and atraumatic.  Eyes:     General: No scleral icterus. Cardiovascular:     Rate and Rhythm: Normal rate and regular rhythm.     Heart sounds: Normal heart sounds.  Pulmonary:     Effort: Pulmonary effort is normal. No respiratory distress.     Breath sounds: No wheezing.     Comments: Right lower lung decreased breath sound. Bilateral Rales Abdominal:     General: Bowel sounds are normal. There is no distension.     Palpations: Abdomen is soft.  Musculoskeletal:        General: Swelling present. No deformity. Normal range of motion.     Cervical back: Normal range of motion and neck supple.     Comments: Bilateral lower extremity 1+ edema  Skin:    General: Skin is warm and dry.     Findings: No erythema or rash.  Neurological:     Mental Status: He is alert and oriented to person, place, and time. Mental status is at baseline.     Cranial Nerves: No cranial nerve deficit.     Coordination: Coordination normal.  Psychiatric:        Mood and Affect: Mood normal.     LABORATORY DATA:  I have reviewed the data as listed Lab  Results  Component Value Date   WBC 7.2  10/07/2020   HGB 14.3 10/07/2020   HCT 42.8 10/07/2020   MCV 99.5 10/07/2020   PLT 121 (L) 10/07/2020   Recent Labs    01/19/20 1754 01/19/20 2240 01/20/20 0233 01/21/20 0536 06/09/20 0814 06/09/20 0814 06/26/20 0956 07/02/20 0810 09/29/20 1210 10/07/20 1205  NA 138   < > 139   < > 138   < > 136 138 135 134*  K 6.1*   < > 5.3*   < > 4.6   < > 5.8* 4.6 4.6 5.2*  CL 111   < > 107   < > 100   < > 96 99 93* 95*  CO2 22   < > 23   < > 25   < > 21 26 29 29   GLUCOSE 109*   < > 115*   < > 136*   < > 111* 122* 108* 129*  BUN 35*   < > 36*   < > 65*   < > 57* 61* 92* 81*  CREATININE 2.20*   < > 2.25*   < > 3.18*   < > 2.78* 2.86* 3.10* 3.27*  CALCIUM 8.5*   < > 8.8*   < > 8.6*   < > 9.2 9.3 9.3 8.3*  GFRNONAA 26*   < > 25*   < > 17*   < > 20* 19* 17* 16*  GFRAA 30*   < > 30*   < > 19*  --  23* 22*  --   --   PROT 6.1*  --  5.9*  --   --   --   --   --   --  6.5  ALBUMIN 3.5  --  3.4*  --   --   --   --   --   --  3.3*  AST 28  --  44*  --   --   --   --   --   --  27  ALT 28  --  27  --   --   --   --   --   --  20  ALKPHOS 41  --  39  --   --   --   --   --   --  103  BILITOT 0.8  --  0.6  --   --   --   --   --   --  1.0   < > = values in this interval not displayed.   Iron/TIBC/Ferritin/ %Sat    Component Value Date/Time   IRON 39 (L) 12/14/2007 0310   FERRITIN 120 12/14/2007 0310      RADIOGRAPHIC STUDIES: I have personally reviewed the radiological images as listed and agreed with the findings in the report. CT CHEST WO CONTRAST  Result Date: 09/26/2020 CLINICAL DATA:  Productive cough. EXAM: CT CHEST WITHOUT CONTRAST TECHNIQUE: Multidetector CT imaging of the chest was performed following the standard protocol without IV contrast. COMPARISON:  None. FINDINGS: Cardiovascular: Atherosclerosis of thoracic aorta is noted without aneurysm formation. Normal cardiac size. No pericardial effusion is noted. Coronary artery calcifications  are noted. Mediastinum/Nodes: No enlarged mediastinal or axillary lymph nodes. Thyroid gland, trachea, and esophagus demonstrate no significant findings. Lungs/Pleura: No pneumothorax is noted. Emphysematous disease is noted. Left lung is clear. Moderate right pleural effusion is noted with probable adjacent right lower lobe subsegmental atelectasis. Right upper lobe airspace Passy is noted concerning for pneumonia. Upper Abdomen: No acute abnormality. Musculoskeletal: No chest  wall mass or suspicious bone lesions identified. IMPRESSION: 1. Moderate right pleural effusion is noted with probable adjacent right lower lobe subsegmental atelectasis. 2. Right upper lobe airspace Passy is noted concerning for pneumonia. 3. Coronary artery calcifications are noted suggesting coronary artery disease. 4. Emphysema and aortic atherosclerosis. Aortic Atherosclerosis (ICD10-I70.0) and Emphysema (ICD10-J43.9). Electronically Signed   By: Marijo Conception M.D.   On: 09/26/2020 14:54   DG Chest Port 1 View  Result Date: 09/29/2020 CLINICAL DATA:  Status post thoracentesis EXAM: PORTABLE CHEST 1 VIEW COMPARISON:  Chest CT September 25, 2020 FINDINGS: No pneumothorax. Essentially complete resolution of pleural effusion after thoracentesis. Airspace consolidation noted in the right mid lung, essentially stable compared to recent study. Left lung clear. Heart is borderline enlarged with pulmonary vascularity normal. No adenopathy. No bone lesions. IMPRESSION: No pneumothorax with resolution of pleural effusion following thoracentesis. Consolidation right mid lung, most likely due to pneumonia. An obstructing bronchial lesion could present in this manner. Serial evaluation to clearing advised. Lungs elsewhere clear. Heart borderline enlarged with pulmonary vascularity normal. No adenopathy. Followup PA and lateral chest radiographs recommended in 3-4 weeks following trial of antibiotic therapy to ensure resolution and exclude  underlying malignancy. Electronically Signed   By: Lowella Grip III M.D.   On: 09/29/2020 14:48   US THORACENTESIS ASP PLEURAL SPACE W/IMG GUIDE  Result Date: 09/29/2020 INDICATION: Shortness of breath. Large right pleural effusion. Recent history of pneumonia. Request diagnostic and therapeutic thoracentesis. EXAM: ULTRASOUND GUIDED RIGHT THORACENTESIS MEDICATIONS: 1% plain lidocaine, 10 mL COMPLICATIONS: None immediate. Postprocedural chest x-ray negative for pneumothorax PROCEDURE: An ultrasound guided thoracentesis was thoroughly discussed with the patient and questions answered. The benefits, risks, alternatives and complications were also discussed. The patient understands and wishes to proceed with the procedure. Written consent was obtained. Ultrasound was performed to localize and mark an adequate pocket of fluid in the right chest. The area was then prepped and draped in the normal sterile fashion. 1% Lidocaine was used for local anesthesia. Under ultrasound guidance a 6 Fr Safe-T-Centesis catheter was introduced. Thoracentesis was performed. The catheter was removed and a dressing applied. FINDINGS: A total of approximately 1.3 L of clear, dark yellow fluid was removed. Samples were sent to the laboratory as requested by the clinical team. IMPRESSION: Successful ultrasound guided right thoracentesis yielding 1.3 L of pleural fluid. Read by: Ascencion Dike PA-C Electronically Signed   By: Marcello Moores  Register   On: 09/29/2020 14:53      ASSESSMENT & PLAN:  1. Malignant pleural effusion   2. Primary adenocarcinoma of right lung (HCC)   3. Stage 4 chronic kidney disease ([place])   4. Cough   5. Goals of care, counseling/discussion    Pathology was reviewed and discussed with patient.  Diagnosis of lung cancer was discussed. Primary lung mass was not found on previous CT.  Recommend him to complete staging with PET scan and MRI brain.  cTxNx M1 I will check with pathology to see if NGS can be  sent.  Check CBC and CMP, LDH.  Patient has multiple medical problems and poor performance status. He may not tolerate aggressive chemotherapy.  NGS will provide additional information about immunotherapy +/- target therapy  #Intractable cough, I sent him a Rx of guaifenesin codeine.  # Nausea, patient reports that Dr. Caryl Comes gave him a Rx for nausea.  # CKD, creatinine has been worse than his baseline and PCP has reduced torsemid dose.  # Atrial Fibrillation, on Xarelto. His GFR  is 16. Close monitor. Consider switch to Eliquis if GFR decreases below 15 Orders Placed This Encounter  Procedures   NM PET Image Initial (PI) Skull Base To Thigh    Standing Status:   Future    Standing Expiration Date:   10/07/2021    Order Specific Question:   If indicated for the ordered procedure, I authorize the administration of a radiopharmaceutical per Radiology protocol    Answer:   Yes    Order Specific Question:   Preferred imaging location?    Answer:   [facility]   MR Brain W Wo Contrast    Standing Status:   Future    Standing Expiration Date:   10/07/2021    Order Specific Question:   If indicated for the ordered procedure, I authorize the administration of contrast media per Radiology protocol    Answer:   Yes    Order Specific Question:   What is the patient's sedation requirement?    Answer:   No Sedation    Order Specific Question:   Does the patient have a pacemaker or implanted devices?    Answer:   No    Order Specific Question:   Use SRS Protocol?    Answer:   Yes    Order Specific Question:   Preferred imaging location?    Answer:   [facility] (table limit - 550lbs)   CBC with Differential/Platelet    Standing Status:   Future    Number of Occurrences:   1    Standing Expiration Date:   10/07/2021   Comprehensive metabolic panel    Standing Status:   Future    Number of Occurrences:   1    Standing Expiration Date:   10/07/2021   Lactate dehydrogenase     Standing Status:   Future    Number of Occurrences:   1    Standing Expiration Date:   10/07/2021    All questions were answered. The patient knows to call the clinic with any problems questions or concerns.  cc Minna Merritts, MD    Return of visit:  To be determined.  Thank you for this kind referral and the opportunity to participate in the care of this patient. A copy of today's note is routed to referring provider    Earlie Server, MD, PhD Hematology Oncology [facility] Pager- [phone] 10/07/2020

## 2020-10-07 NOTE — Progress Notes (Signed)
New patient evaluation.  Patient has constant SOBr with cough for 5 weeks.  Change in taste that has contributed to loss of appetite.

## 2020-10-08 ENCOUNTER — Other Ambulatory Visit: Payer: PPO

## 2020-10-08 ENCOUNTER — Other Ambulatory Visit: Payer: Self-pay | Admitting: Pulmonary Disease

## 2020-10-08 DIAGNOSIS — J910 Malignant pleural effusion: Secondary | ICD-10-CM | POA: Diagnosis not present

## 2020-10-08 DIAGNOSIS — R053 Chronic cough: Secondary | ICD-10-CM | POA: Diagnosis not present

## 2020-10-08 NOTE — Progress Notes (Addendum)
Tumor Board Documentation  [name] [name] was presented by Dr Tasia Catchings at our Tumor Board on 10/08/2020, which included representatives from medical oncology, radiation oncology, surgical oncology, internal medicine, navigation, pathology, radiology, surgical, pharmacy, genetics, research, palliative care, pulmonology.  [name] [name] currently presents as a new patient, for [place], for new positive pathology with history of the following treatments: active survellience.  Additionally, we reviewed previous medical and familial history, history of present illness, and recent lab results along with all available histopathologic and imaging studies. The tumor board considered available treatment options and made the following recommendations:Additional Path Molecular testing, Palliative Care referral, Treatment to be determined  The following procedures/referrals were also placed: No orders of the defined types were placed in this encounter.   Clinical Trial Status: not discussed   Staging used: AJCC Stage Group  AJCC Staging: T: x N: x M: 1a Group: Adenocarcinoma of [place] Stage IVa   National site-specific guidelines NCCN were discussed with respect to the case.  Tumor board is a meeting of clinicians from various specialty areas who evaluate and discuss patients for whom a multidisciplinary approach is being considered. Final determinations in the plan of care are those of the provider(s). The responsibility for follow up of recommendations given during tumor board is that of the provider.   Today's extended care, comprehensive team conference, [name] [name] was not present for the discussion and was not examined.   Multidisciplinary Tumor Board is a multidisciplinary case peer review process.  Decisions discussed in the Multidisciplinary Tumor Board reflect the opinions of the specialists present at the conference without having examined the patient.  Ultimately, treatment and diagnostic decisions rest with the  primary provider(s) and the patient.

## 2020-10-09 ENCOUNTER — Inpatient Hospital Stay: Payer: PPO

## 2020-10-09 ENCOUNTER — Other Ambulatory Visit: Payer: Self-pay

## 2020-10-09 ENCOUNTER — Encounter: Payer: Self-pay | Admitting: Hospice and Palliative Medicine

## 2020-10-09 ENCOUNTER — Inpatient Hospital Stay (HOSPITAL_BASED_OUTPATIENT_CLINIC_OR_DEPARTMENT_OTHER): Payer: PPO | Admitting: Hospice and Palliative Medicine

## 2020-10-09 VITALS — BP 106/65 | HR 91 | Temp 98.3°F | Resp 18 | Wt 202.4 lb

## 2020-10-09 DIAGNOSIS — C3491 Malignant neoplasm of unspecified part of right bronchus or lung: Secondary | ICD-10-CM

## 2020-10-09 DIAGNOSIS — J910 Malignant pleural effusion: Secondary | ICD-10-CM

## 2020-10-09 DIAGNOSIS — Z515 Encounter for palliative care: Secondary | ICD-10-CM | POA: Diagnosis not present

## 2020-10-09 NOTE — Progress Notes (Signed)
Patient here for palliative care consult. He reports having trouble swallowing. He is currently on antibiotic and prednisone taper for URI.

## 2020-10-09 NOTE — Progress Notes (Signed)
[place]  [phone] [phone]   Name: [name] [name] Date: 10/09/2020 MRN: [id number]  DOB: 09-18-1931  Patient Care Team: [name], MD as PCP - General (Internal Medicine) Rockey Situ Kathlene November, MD as PCP - Cardiology (Cardiology) Minna Merritts, MD as Consulting Physician (Cardiology) Telford Nab, RN as Oncology Nurse Navigator    REASON FOR CONSULTATION: AUBRA [name] [name] is an 86 y.o. male with multiple medical problems including CAD, ischemic cardiomyopathy with EF of 25 to 30%, and atrial fibrillation on Xarelto.  Patient had a persistent cough over a month, which did not improve with outpatient antibiotics.  Patient was subsequently evaluated by cardiology and underwent CT of the chest revealing a moderately sized right pleural effusion.  Patient underwent thoracentesis on 09/29/2020 yielding 1.3 L of fluid.  Cytology was positive for adenocarcinoma.  Patient was referred to medical oncology and is pending PET scan.  He is felt not likely to be a viable candidate for aggressive chemotherapy given his poor performance status.  NGS testing is pending.  Patient was referred to palliative care to help address goals and manage ongoing symptoms.  SOCIAL HISTORY:     reports that he quit smoking about 51 years ago. His smoking use included cigarettes. He has a 20.00 pack-year smoking history. He has never used smokeless tobacco. He reports that he does not drink alcohol and does not use drugs.  Patient is married and lives at home with his wife.  They have a daughter in the [place], [place] area.  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  He formerly served in [employer].  ADVANCE DIRECTIVES:  Does not have  CODE STATUS:   PAST MEDICAL HISTORY: Past Medical History:  Diagnosis Date  . Arthritis   . Asymptomatic Sinus bradycardia   . CAD (coronary artery disease)    a.  10/2005 NSTEMI/PCI: LAD 95 (2.75x33 Cypher DES); b. 2008 Ant STEMI/PCI LAD 2/2 ISR; c. 09/2011 PCI OM2; d. 02/2019 MV: Fixed mid-dist ant/apical, mid-apical inferolateral dfects w/o ischemia; e. 12/2019 Cath: LM nl, LAD 25p/m, patent stent, D2 80, LCX nl, OM2 patent stent, RCA min irregs.  . CKD (chronic kidney disease), stage III ([place])   . HFrEF (heart failure with reduced ejection fraction) ([place])    a. 12/2019 Echo: EF 20-25%, glob HK. Mid-apical septum, inf, apical AK. Mildly reduced RV fxn. Triv MR.  Marland Kitchen Hyperlipidemia   . Hypertension   . Ischemic cardiomyopathy    a. 07/2015 Echo: EF 45-50%; b. 12/2019 Echo: EF 20-25%.  Marland Kitchen LBBB (left bundle branch block)   . Myocardial infarction ([place])   . Persistent atrial fibrillation ([place])    a.  Diagnosed January 2021.  CHA2DS2VASc = 5--> xarelto.  . Tobacco user    Remote    PAST SURGICAL HISTORY:  Past Surgical History:  Procedure Laterality Date  . CARDIAC CATHETERIZATION  10/21/2011   s/p stent placement  . CARDIAC CATHETERIZATION  2008   stent placement  . CARDIAC CATHETERIZATION  2006   stent placement  . CARDIOVERSION N/A 03/05/2020   Procedure: CARDIOVERSION;  Surgeon: Minna Merritts, MD;  Location: [facility] ORS;  Service: Cardiovascular;  Laterality: N/A;  . CARPAL TUNNEL RELEASE Right 07/22/2016   Procedure: CARPAL TUNNEL RELEASE;  Surgeon: Leanor Kail, MD;  Location: [facility] ORS;  Service: Orthopedics;  Laterality: Right;  . JOINT REPLACEMENT    . LEFT HEART CATH AND CORONARY ANGIOGRAPHY  N/A 01/20/2020   Procedure: LEFT HEART CATH AND CORONARY ANGIOGRAPHY;  Surgeon: Jettie Booze, MD;  Location: [place] [facility];  Service: Cardiovascular;  Laterality: N/A;  . TOTAL HIP ARTHROPLASTY     Right  . TOTAL KNEE ARTHROPLASTY Left 06/29/2018   Procedure: LEFT TOTAL KNEE ARTHROPLASTY;  Surgeon: Frederik Pear, MD;  Location: [place];  Service: Orthopedics;  Laterality: Left;  . TRANSURETHRAL RESECTION OF PROSTATE      HEMATOLOGY/ONCOLOGY  HISTORY:  Oncology History   No history exists.    ALLERGIES:  is allergic to penicillins.  MEDICATIONS:  Current Outpatient Medications  Medication Sig Dispense Refill  . allopurinol (ZYLOPRIM) 100 MG tablet daily.    Marland Kitchen amiodarone (PACERONE) 200 MG tablet Take 1 tablet (200 mg total) by mouth daily. 90 tablet 0  . carvedilol (COREG) 6.25 MG tablet Take 1 tablet (6.25 mg total) by mouth 2 (two) times daily with a meal. 180 tablet 3  . guaiFENesin-codeine (ROBITUSSIN AC) 100-10 MG/5ML syrup Take 5 mLs by mouth 3 (three) times daily as needed for cough. 120 mL 0  . hydrALAZINE (APRESOLINE) 10 MG tablet Take 1 tablet (10 mg total) by mouth 3 (three) times daily. (Patient taking differently: Take 10 mg by mouth daily. ) 270 tablet 1  . isosorbide mononitrate (IMDUR) 30 MG 24 hr tablet Take 0.5 tablets (15 mg total) by mouth daily. 90 tablet 1  . Multiple Vitamin (MULTIVITAMIN WITH MINERALS) TABS tablet Take 2 tablets by mouth daily.    . nitroGLYCERIN (NITROSTAT) 0.4 MG SL tablet Place 0.4 mg under the tongue every 5 (five) minutes as needed for chest pain.    . pantoprazole (PROTONIX) 40 MG tablet Take by mouth.    . Patiromer Sorbitex Calcium (VELTASSA PO) Take 1 Package by mouth once a week. (Patient not taking: Reported on 10/07/2020)    . Probiotic CAPS Take 1 capsule by mouth daily.    . Rivaroxaban (XARELTO) 15 MG TABS tablet Take 15 mg by mouth daily with supper.    . rosuvastatin (CRESTOR) 10 MG tablet Take 1 tablet (10 mg total) by mouth daily. 90 tablet 1  . torsemide (DEMADEX) 20 MG tablet Take 1 tablet (20 mg total) by mouth as directed. Take 20 twice a day alternating with 20 once a day. 180 tablet 3  . triamcinolone cream (KENALOG) 0.1 % Apply 1 application topically daily as needed (itching).     No current facility-administered medications for this visit.    VITAL SIGNS: There were no vitals taken for this visit. There were no vitals filed for this visit.  Estimated body  mass index is 33.32 kg/m as calculated from the following:   Height as of 09/29/20: 5' 5"  (1.651 m).   Weight as of 10/07/20: 200 lb 3.2 oz (90.8 kg).  LABS: CBC:    Component Value Date/Time   WBC 7.2 10/07/2020 1205   HGB 14.3 10/07/2020 1205   HGB 14.9 02/26/2020 1206   HCT 42.8 10/07/2020 1205   HCT 43.6 02/26/2020 1206   PLT 121 (L) 10/07/2020 1205   PLT 162 02/26/2020 1206   MCV 99.5 10/07/2020 1205   MCV 101 (H) 02/26/2020 1206   NEUTROABS 5.2 10/07/2020 1205   NEUTROABS 4.1 02/26/2020 1206   LYMPHSABS 1.5 10/07/2020 1205   LYMPHSABS 2.2 02/26/2020 1206   MONOABS 0.4 10/07/2020 1205   EOSABS 0.1 10/07/2020 1205   EOSABS 0.3 02/26/2020 1206   BASOSABS 0.0 10/07/2020 1205   BASOSABS 0.0 02/26/2020  1206   Comprehensive Metabolic Panel:    Component Value Date/Time   NA 134 (L) 10/07/2020 1205   NA 136 06/26/2020 0956   K 5.2 (H) 10/07/2020 1205   CL 95 (L) 10/07/2020 1205   CO2 29 10/07/2020 1205   BUN 81 (H) 10/07/2020 1205   BUN 57 (H) 06/26/2020 0956   CREATININE 3.27 (H) 10/07/2020 1205   GLUCOSE 129 (H) 10/07/2020 1205   CALCIUM 8.3 (L) 10/07/2020 1205   AST 27 10/07/2020 1205   ALT 20 10/07/2020 1205   ALKPHOS 103 10/07/2020 1205   BILITOT 1.0 10/07/2020 1205   PROT 6.5 10/07/2020 1205   ALBUMIN 3.3 (L) 10/07/2020 1205    RADIOGRAPHIC STUDIES: CT CHEST WO CONTRAST  Result Date: 09/26/2020 CLINICAL DATA:  Productive cough. EXAM: CT CHEST WITHOUT CONTRAST TECHNIQUE: Multidetector CT imaging of the chest was performed following the standard protocol without IV contrast. COMPARISON:  None. FINDINGS: Cardiovascular: Atherosclerosis of thoracic aorta is noted without aneurysm formation. Normal cardiac size. No pericardial effusion is noted. Coronary artery calcifications are noted. Mediastinum/Nodes: No enlarged mediastinal or axillary lymph nodes. Thyroid gland, trachea, and esophagus demonstrate no significant findings. Lungs/Pleura: No pneumothorax is noted.  Emphysematous disease is noted. Left lung is clear. Moderate right pleural effusion is noted with probable adjacent right lower lobe subsegmental atelectasis. Right upper lobe airspace Passy is noted concerning for pneumonia. Upper Abdomen: No acute abnormality. Musculoskeletal: No chest wall mass or suspicious bone lesions identified. IMPRESSION: 1. Moderate right pleural effusion is noted with probable adjacent right lower lobe subsegmental atelectasis. 2. Right upper lobe airspace Passy is noted concerning for pneumonia. 3. Coronary artery calcifications are noted suggesting coronary artery disease. 4. Emphysema and aortic atherosclerosis. Aortic Atherosclerosis (ICD10-I70.0) and Emphysema (ICD10-J43.9). Electronically Signed   By: Marijo Conception M.D.   On: 09/26/2020 14:54   DG Chest Port 1 View  Result Date: 09/29/2020 CLINICAL DATA:  Status post thoracentesis EXAM: PORTABLE CHEST 1 VIEW COMPARISON:  Chest CT September 25, 2020 FINDINGS: No pneumothorax. Essentially complete resolution of pleural effusion after thoracentesis. Airspace consolidation noted in the right mid lung, essentially stable compared to recent study. Left lung clear. Heart is borderline enlarged with pulmonary vascularity normal. No adenopathy. No bone lesions. IMPRESSION: No pneumothorax with resolution of pleural effusion following thoracentesis. Consolidation right mid lung, most likely due to pneumonia. An obstructing bronchial lesion could present in this manner. Serial evaluation to clearing advised. Lungs elsewhere clear. Heart borderline enlarged with pulmonary vascularity normal. No adenopathy. Followup PA and lateral chest radiographs recommended in 3-4 weeks following trial of antibiotic therapy to ensure resolution and exclude underlying malignancy. Electronically Signed   By: Lowella Grip III M.D.   On: 09/29/2020 14:48   US THORACENTESIS ASP PLEURAL SPACE W/IMG GUIDE  Result Date: 09/29/2020 INDICATION: Shortness of  breath. Large right pleural effusion. Recent history of pneumonia. Request diagnostic and therapeutic thoracentesis. EXAM: ULTRASOUND GUIDED RIGHT THORACENTESIS MEDICATIONS: 1% plain lidocaine, 10 mL COMPLICATIONS: None immediate. Postprocedural chest x-ray negative for pneumothorax PROCEDURE: An ultrasound guided thoracentesis was thoroughly discussed with the patient and questions answered. The benefits, risks, alternatives and complications were also discussed. The patient understands and wishes to proceed with the procedure. Written consent was obtained. Ultrasound was performed to localize and mark an adequate pocket of fluid in the right chest. The area was then prepped and draped in the normal sterile fashion. 1% Lidocaine was used for local anesthesia. Under ultrasound guidance a 6 Fr Safe-T-Centesis catheter was  introduced. Thoracentesis was performed. The catheter was removed and a dressing applied. FINDINGS: A total of approximately 1.3 L of clear, dark yellow fluid was removed. Samples were sent to the laboratory as requested by the clinical team. IMPRESSION: Successful ultrasound guided right thoracentesis yielding 1.3 L of pleural fluid. Read by: Ascencion Dike PA-C Electronically Signed   By: Marcello Moores  Register   On: 09/29/2020 14:53    PERFORMANCE STATUS (ECOG) : 2-3  Review of Systems Unless otherwise noted, a complete review of systems is negative.  Physical Exam General: NAD Cardiovascular: Irregular Pulmonary: clear ant fields, chronic nonproductive cough Abdomen: soft, nontender, + bowel sounds GU: no suprapubic tenderness Extremities: +edema, no joint deformities Skin: no rashes Neurological: Weakness but otherwise nonfocal  IMPRESSION: I met with patient and his wife today in the clinic.  I introduced palliative care services and attempted to establish therapeutic rapport.  Patient is interested in completing work-up and discussing options for treatment with medical oncology.   He verbalized understanding that his options for treatment might be limited based on his frailty.   Patient admits that at baseline, he is rather weak and ambulates with use of a cane.  He has a recent fall where his legs gave out.  He is mostly sedentary during the day.  However, he does feel that he is able to maintain his own ADLs.  Symptomatically, patient describes constipation over the past several days.  He has been taking an OTC laxative but is unsure of which.  He has also tried some MiraLAX.  I suggested that he increase the MiraLAX to twice daily and start OTC Senokot.  We discussed dose adjustment of the Senokot as needed to achieve a goal of having a bowel movement every other day or so.  Patient also has a chronic, nonproductive cough.  He was started on Robitussin-AC by Dr. Tasia Catchings and feels that has helped some.  He was also started on benzonatate yesterday by pulmonology.  Additionally, patient says that his oral intake is poor.  He has some dysphagia and feels like food is getting stuck.  As a result, he is mostly been drinking a liquid diet over the past month.  I gave him some samples of oral supplements today.  Will refer him to nutrition.  We will also refer him for modified barium swallow study.  Discussed with speech therapy.  Patient does not have any advance directives.  I reviewed ACP documents and a MOST form today.  Patient was unsure about CODE STATUS and wanted some time to reflect on decision-making.  PLAN: -Continue current scope of treatment -ACP/MOST form reviewed -Oral supplements 3 times daily -Referral to nutrition -Referral to SLP for MBSS -RTC in 2 to 3 weeks  Case and plan discussed with Dr. Tasia Catchings   Patient expressed understanding and was in agreement with this plan. He also understands that He can call the clinic at any time with any questions, concerns, or complaints.     Time Total: 30 minutes  Visit consisted of counseling and education dealing with  the complex and emotionally intense issues of symptom management and palliative care in the setting of serious and potentially life-threatening illness.Greater than 50%  of this time was spent counseling and coordinating care related to the above assessment and plan.  Signed by: Altha Harm, PhD, NP-C

## 2020-10-14 ENCOUNTER — Other Ambulatory Visit: Payer: Self-pay | Admitting: Pulmonary Disease

## 2020-10-14 ENCOUNTER — Other Ambulatory Visit
Admission: RE | Admit: 2020-10-14 | Discharge: 2020-10-14 | Disposition: A | Discharge status: Home / Self Care | Payer: PPO | Source: Ambulatory Visit | Attending: Pulmonary Disease | Admitting: Pulmonary Disease

## 2020-10-14 ENCOUNTER — Other Ambulatory Visit: Payer: Self-pay

## 2020-10-14 DIAGNOSIS — Z01812 Encounter for preprocedural laboratory examination: Secondary | ICD-10-CM | POA: Diagnosis not present

## 2020-10-14 DIAGNOSIS — J910 Malignant pleural effusion: Secondary | ICD-10-CM

## 2020-10-14 DIAGNOSIS — Z20822 Contact with and (suspected) exposure to covid-19: Secondary | ICD-10-CM | POA: Insufficient documentation

## 2020-10-14 LAB — SARS CORONAVIRUS 2 (TAT 6-24 HRS): SARS Coronavirus 2: NEGATIVE

## 2020-10-15 ENCOUNTER — Ambulatory Visit
Admission: RE | Admit: 2020-10-15 | Discharge: 2020-10-15 | Disposition: A | Discharge status: Home / Self Care | Payer: PPO | Source: Ambulatory Visit | Attending: Pulmonary Disease | Admitting: Pulmonary Disease

## 2020-10-15 ENCOUNTER — Other Ambulatory Visit: Payer: Self-pay | Admitting: Interventional Radiology

## 2020-10-15 ENCOUNTER — Ambulatory Visit
Admission: RE | Admit: 2020-10-15 | Discharge: 2020-10-15 | Disposition: A | Discharge status: Home / Self Care | Payer: PPO | Source: Ambulatory Visit | Attending: Oncology | Admitting: Oncology

## 2020-10-15 ENCOUNTER — Ambulatory Visit
Admission: RE | Admit: 2020-10-15 | Discharge: 2020-10-15 | Disposition: A | Discharge status: Home / Self Care | Payer: PPO | Source: Ambulatory Visit | Attending: Interventional Radiology | Admitting: Interventional Radiology

## 2020-10-15 DIAGNOSIS — C3490 Malignant neoplasm of unspecified part of unspecified bronchus or lung: Secondary | ICD-10-CM | POA: Diagnosis not present

## 2020-10-15 DIAGNOSIS — J910 Malignant pleural effusion: Secondary | ICD-10-CM

## 2020-10-15 DIAGNOSIS — Z9889 Other specified postprocedural states: Secondary | ICD-10-CM

## 2020-10-15 DIAGNOSIS — C3491 Malignant neoplasm of unspecified part of right bronchus or lung: Secondary | ICD-10-CM

## 2020-10-15 DIAGNOSIS — J90 Pleural effusion, not elsewhere classified: Secondary | ICD-10-CM | POA: Diagnosis not present

## 2020-10-15 DIAGNOSIS — R918 Other nonspecific abnormal finding of lung field: Secondary | ICD-10-CM | POA: Diagnosis not present

## 2020-10-15 LAB — BODY FLUID CELL COUNT WITH DIFFERENTIAL
Eos, Fluid: 0 %
Lymphs, Fluid: 24 %
Monocyte-Macrophage-Serous Fluid: 73 %
Neutrophil Count, Fluid: 3 %
Total Nucleated Cell Count, Fluid: 536 cu mm

## 2020-10-15 LAB — PROTEIN, PLEURAL OR PERITONEAL FLUID: Total protein, fluid: <3 g/dL

## 2020-10-15 LAB — GLUCOSE, PLEURAL OR PERITONEAL FLUID: Glucose, Fluid: 116 mg/dL

## 2020-10-15 LAB — AMYLASE, PLEURAL OR PERITONEAL FLUID: Amylase, Fluid: 406 U/L

## 2020-10-15 LAB — LACTATE DEHYDROGENASE, PLEURAL OR PERITONEAL FLUID: LD, Fluid: 174 U/L — ABNORMAL HIGH (ref 3–23)

## 2020-10-16 ENCOUNTER — Encounter: Payer: Self-pay | Admitting: Oncology

## 2020-10-16 ENCOUNTER — Other Ambulatory Visit: Payer: Self-pay | Admitting: Hospice and Palliative Medicine

## 2020-10-16 ENCOUNTER — Telehealth: Payer: Self-pay | Admitting: *Deleted

## 2020-10-16 ENCOUNTER — Telehealth: Payer: Self-pay | Admitting: Hospice and Palliative Medicine

## 2020-10-16 DIAGNOSIS — C3491 Malignant neoplasm of unspecified part of right bronchus or lung: Secondary | ICD-10-CM

## 2020-10-16 LAB — PROTEIN, BODY FLUID (OTHER): Total Protein, Body Fluid Other: 2.7 g/dL

## 2020-10-16 LAB — PH, BODY FLUID: pH, Body Fluid: 7.4

## 2020-10-16 LAB — ACID FAST SMEAR (AFB, MYCOBACTERIA): Acid Fast Smear: NEGATIVE

## 2020-10-16 LAB — CYTOLOGY - NON PAP

## 2020-10-16 MED ORDER — ONDANSETRON 4 MG PO TBDP: 4.0000 mg | ORAL_TABLET | Freq: Three times a day (TID) | ORAL | 0 refills | Status: DC | PRN

## 2020-10-16 NOTE — Telephone Encounter (Signed)
Wife called reporting that patient is staying nauseated and is not eating. He has difficulty swallowing. She states he is very weak. Her son in law who is a PA brought him a couple of Zofran tabs last night and that seemed to help. She is asking if we would call in prescription for this to [facility]. She is interested also in seeing the dietician. He does not like the Boost and Ensure milky consistency and I recommended that he can try Boost Breeze or Ensure Clear. She will try to get some of that for him. Please advise.  Of note, he could not get is PET yesterday because he drank some Gingerale before he went, so it has been rescheduled for next week

## 2020-10-16 NOTE — Telephone Encounter (Signed)
I received a call from patient's wife.  She says that patient was unable to get his PET scan because one of the nurses gave him a soda to drink.  He has subsequently had progressive weakness and is really unable to tolerate much oral intake.  He is not really eating anything and only drinking sips of fluids.  Case discussed with Dr. Tasia Catchings.  It seems increasingly unlikely that patient will have viable treatment options given his progressively poor performance status.  Dr. Tasia Catchings recommended ER versus hospice.  I discussed both of these options in detail with patient's wife.  She says that she plans to talk with patient and see what he wants to do.

## 2020-10-16 NOTE — Telephone Encounter (Signed)
Call returned to wife and advised per Billey Chang, NP Palliative care that prescription has been sent for Zofran and that a referral has already been placed for Dietician consult as well as a swallowing study to be performed. She thanked me for letting her know

## 2020-10-16 NOTE — Progress Notes (Signed)
Rx for Zofran ODT.

## 2020-10-18 DIAGNOSIS — C3491 Malignant neoplasm of unspecified part of right bronchus or lung: Secondary | ICD-10-CM | POA: Diagnosis not present

## 2020-10-18 LAB — BODY FLUID CULTURE: Culture: NO GROWTH

## 2020-10-20 ENCOUNTER — Other Ambulatory Visit: Payer: Self-pay | Admitting: Hospice and Palliative Medicine

## 2020-10-20 ENCOUNTER — Encounter: Payer: Self-pay | Admitting: Oncology

## 2020-10-20 MED ORDER — ALPRAZOLAM 0.25 MG PO TABS: 0.2500 mg | ORAL_TABLET | Freq: Two times a day (BID) | ORAL | 0 refills | Status: DC | PRN

## 2020-10-20 NOTE — Progress Notes (Signed)
I received a message from patient's wife that he is requesting something for anxiety PRN for the PET scan tomorrow. Patient has history of claustrophobia.  Will send Rx for as needed alprazolam.

## 2020-10-21 ENCOUNTER — Emergency Department: Payer: PPO

## 2020-10-21 ENCOUNTER — Other Ambulatory Visit: Payer: Self-pay

## 2020-10-21 ENCOUNTER — Telehealth: Payer: Self-pay | Admitting: Hospice and Palliative Medicine

## 2020-10-21 ENCOUNTER — Telehealth: Payer: Self-pay | Admitting: Oncology

## 2020-10-21 ENCOUNTER — Inpatient Hospital Stay
Admission: EM | Admit: 2020-10-21 | Discharge: 2020-10-23 | DRG: 683 | Disposition: A | Discharge status: Hospice | Payer: PPO | Attending: Internal Medicine | Admitting: Internal Medicine

## 2020-10-21 ENCOUNTER — Inpatient Hospital Stay
Admission: RE | Admit: 2020-10-21 | Discharge: 2020-10-21 | Disposition: A | Discharge status: Home / Self Care | Payer: PPO | Source: Ambulatory Visit | Attending: Oncology | Admitting: Oncology

## 2020-10-21 ENCOUNTER — Telehealth: Payer: Self-pay | Admitting: *Deleted

## 2020-10-21 DIAGNOSIS — J439 Emphysema, unspecified: Secondary | ICD-10-CM | POA: Diagnosis not present

## 2020-10-21 DIAGNOSIS — M47812 Spondylosis without myelopathy or radiculopathy, cervical region: Secondary | ICD-10-CM | POA: Diagnosis not present

## 2020-10-21 DIAGNOSIS — I2510 Atherosclerotic heart disease of native coronary artery without angina pectoris: Secondary | ICD-10-CM | POA: Diagnosis present

## 2020-10-21 DIAGNOSIS — C7951 Secondary malignant neoplasm of bone: Secondary | ICD-10-CM | POA: Diagnosis present

## 2020-10-21 DIAGNOSIS — I480 Paroxysmal atrial fibrillation: Secondary | ICD-10-CM | POA: Diagnosis not present

## 2020-10-21 DIAGNOSIS — Z955 Presence of coronary angioplasty implant and graft: Secondary | ICD-10-CM

## 2020-10-21 DIAGNOSIS — I441 Atrioventricular block, second degree: Secondary | ICD-10-CM | POA: Diagnosis not present

## 2020-10-21 DIAGNOSIS — Z7901 Long term (current) use of anticoagulants: Secondary | ICD-10-CM

## 2020-10-21 DIAGNOSIS — M1990 Unspecified osteoarthritis, unspecified site: Secondary | ICD-10-CM | POA: Diagnosis present

## 2020-10-21 DIAGNOSIS — K219 Gastro-esophageal reflux disease without esophagitis: Secondary | ICD-10-CM | POA: Diagnosis present

## 2020-10-21 DIAGNOSIS — Z66 Do not resuscitate: Secondary | ICD-10-CM | POA: Diagnosis not present

## 2020-10-21 DIAGNOSIS — C779 Secondary and unspecified malignant neoplasm of lymph node, unspecified: Secondary | ICD-10-CM | POA: Diagnosis not present

## 2020-10-21 DIAGNOSIS — R112 Nausea with vomiting, unspecified: Secondary | ICD-10-CM | POA: Diagnosis present

## 2020-10-21 DIAGNOSIS — J910 Malignant pleural effusion: Secondary | ICD-10-CM | POA: Insufficient documentation

## 2020-10-21 DIAGNOSIS — N179 Acute kidney failure, unspecified: Secondary | ICD-10-CM | POA: Diagnosis not present

## 2020-10-21 DIAGNOSIS — I255 Ischemic cardiomyopathy: Secondary | ICD-10-CM | POA: Diagnosis not present

## 2020-10-21 DIAGNOSIS — I10 Essential (primary) hypertension: Secondary | ICD-10-CM | POA: Diagnosis present

## 2020-10-21 DIAGNOSIS — K5900 Constipation, unspecified: Secondary | ICD-10-CM | POA: Diagnosis not present

## 2020-10-21 DIAGNOSIS — Z20822 Contact with and (suspected) exposure to covid-19: Secondary | ICD-10-CM | POA: Diagnosis not present

## 2020-10-21 DIAGNOSIS — D696 Thrombocytopenia, unspecified: Secondary | ICD-10-CM | POA: Diagnosis not present

## 2020-10-21 DIAGNOSIS — Z880 Allergy status to penicillin: Secondary | ICD-10-CM

## 2020-10-21 DIAGNOSIS — E871 Hypo-osmolality and hyponatremia: Secondary | ICD-10-CM | POA: Diagnosis not present

## 2020-10-21 DIAGNOSIS — F411 Generalized anxiety disorder: Secondary | ICD-10-CM | POA: Diagnosis present

## 2020-10-21 DIAGNOSIS — Z7189 Other specified counseling: Secondary | ICD-10-CM

## 2020-10-21 DIAGNOSIS — Z79899 Other long term (current) drug therapy: Secondary | ICD-10-CM

## 2020-10-21 DIAGNOSIS — E860 Dehydration: Secondary | ICD-10-CM | POA: Diagnosis not present

## 2020-10-21 DIAGNOSIS — C3491 Malignant neoplasm of unspecified part of right bronchus or lung: Secondary | ICD-10-CM | POA: Insufficient documentation

## 2020-10-21 DIAGNOSIS — Z8041 Family history of malignant neoplasm of ovary: Secondary | ICD-10-CM

## 2020-10-21 DIAGNOSIS — N184 Chronic kidney disease, stage 4 (severe): Secondary | ICD-10-CM | POA: Diagnosis present

## 2020-10-21 DIAGNOSIS — C787 Secondary malignant neoplasm of liver and intrahepatic bile duct: Secondary | ICD-10-CM | POA: Diagnosis not present

## 2020-10-21 DIAGNOSIS — E875 Hyperkalemia: Secondary | ICD-10-CM | POA: Diagnosis not present

## 2020-10-21 DIAGNOSIS — I447 Left bundle-branch block, unspecified: Secondary | ICD-10-CM | POA: Diagnosis not present

## 2020-10-21 DIAGNOSIS — Z515 Encounter for palliative care: Secondary | ICD-10-CM | POA: Diagnosis not present

## 2020-10-21 DIAGNOSIS — C3490 Malignant neoplasm of unspecified part of unspecified bronchus or lung: Secondary | ICD-10-CM | POA: Diagnosis not present

## 2020-10-21 DIAGNOSIS — Z7952 Long term (current) use of systemic steroids: Secondary | ICD-10-CM

## 2020-10-21 DIAGNOSIS — R531 Weakness: Secondary | ICD-10-CM

## 2020-10-21 DIAGNOSIS — Z820 Family history of epilepsy and other diseases of the nervous system: Secondary | ICD-10-CM

## 2020-10-21 DIAGNOSIS — N189 Chronic kidney disease, unspecified: Secondary | ICD-10-CM

## 2020-10-21 DIAGNOSIS — R1310 Dysphagia, unspecified: Secondary | ICD-10-CM | POA: Diagnosis not present

## 2020-10-21 DIAGNOSIS — E785 Hyperlipidemia, unspecified: Secondary | ICD-10-CM | POA: Diagnosis not present

## 2020-10-21 DIAGNOSIS — I4819 Other persistent atrial fibrillation: Secondary | ICD-10-CM | POA: Diagnosis not present

## 2020-10-21 DIAGNOSIS — Z96641 Presence of right artificial hip joint: Secondary | ICD-10-CM | POA: Diagnosis present

## 2020-10-21 DIAGNOSIS — I130 Hypertensive heart and chronic kidney disease with heart failure and stage 1 through stage 4 chronic kidney disease, or unspecified chronic kidney disease: Secondary | ICD-10-CM | POA: Diagnosis not present

## 2020-10-21 DIAGNOSIS — Z8249 Family history of ischemic heart disease and other diseases of the circulatory system: Secondary | ICD-10-CM

## 2020-10-21 DIAGNOSIS — Z7401 Bed confinement status: Secondary | ICD-10-CM

## 2020-10-21 DIAGNOSIS — I5022 Chronic systolic (congestive) heart failure: Secondary | ICD-10-CM | POA: Diagnosis present

## 2020-10-21 DIAGNOSIS — Z96652 Presence of left artificial knee joint: Secondary | ICD-10-CM | POA: Diagnosis present

## 2020-10-21 DIAGNOSIS — I252 Old myocardial infarction: Secondary | ICD-10-CM

## 2020-10-21 DIAGNOSIS — Z87891 Personal history of nicotine dependence: Secondary | ICD-10-CM

## 2020-10-21 LAB — BASIC METABOLIC PANEL
Anion gap: 13 (ref 5–15)
BUN: 103 mg/dL — ABNORMAL HIGH (ref 8–23)
CO2: 23 mmol/L (ref 22–32)
Calcium: 8.7 mg/dL — ABNORMAL LOW (ref 8.9–10.3)
Chloride: 95 mmol/L — ABNORMAL LOW (ref 98–111)
Creatinine, Ser: 5.09 mg/dL — ABNORMAL HIGH (ref 0.61–1.24)
GFR, Estimated: 10 mL/min — ABNORMAL LOW (ref >60)
Glucose, Bld: 130 mg/dL — ABNORMAL HIGH (ref 70–99)
Potassium: 5.6 mmol/L — ABNORMAL HIGH (ref 3.5–5.1)
Sodium: 131 mmol/L — ABNORMAL LOW (ref 135–145)

## 2020-10-21 LAB — CBC
HCT: 49 % (ref 39.0–52.0)
Hemoglobin: 16.3 g/dL (ref 13.0–17.0)
MCH: 33.2 pg (ref 26.0–34.0)
MCHC: 33.3 g/dL (ref 30.0–36.0)
MCV: 99.8 fL (ref 80.0–100.0)
Platelets: 110 10*3/uL — ABNORMAL LOW (ref 150–400)
RBC: 4.91 MIL/uL (ref 4.22–5.81)
RDW: 16.6 % — ABNORMAL HIGH (ref 11.5–15.5)
WBC: 9.6 10*3/uL (ref 4.0–10.5)
nRBC: 0 % (ref 0.0–0.2)

## 2020-10-21 LAB — RESPIRATORY PANEL BY RT PCR (FLU A&B, COVID)
Influenza A by PCR: NEGATIVE
Influenza B by PCR: NEGATIVE
SARS Coronavirus 2 by RT PCR: NEGATIVE

## 2020-10-21 LAB — GLUCOSE, CAPILLARY: Glucose-Capillary: 102 mg/dL — ABNORMAL HIGH (ref 70–99)

## 2020-10-21 MED ORDER — SODIUM CHLORIDE 0.9 % IV SOLN: Freq: Once | INTRAVENOUS | Status: DC

## 2020-10-21 MED ORDER — LACTATED RINGERS IV SOLN: INTRAVENOUS | Status: DC

## 2020-10-21 MED ORDER — HEPARIN SODIUM (PORCINE) 5000 UNIT/ML IJ SOLN
5000.0000 [IU] | Freq: Three times a day (TID) | INTRAMUSCULAR | Status: DC
  Administered 2020-10-21 – 2020-10-22 (×2): 5000 [IU] via SUBCUTANEOUS
  Filled 2020-10-21: qty 1

## 2020-10-21 MED ORDER — LORAZEPAM 2 MG/ML IJ SOLN: 0.5000 mg | INTRAMUSCULAR | Status: DC | PRN

## 2020-10-21 MED ORDER — CARVEDILOL 6.25 MG PO TABS: 6.2500 mg | ORAL_TABLET | Freq: Two times a day (BID) | ORAL | Status: DC

## 2020-10-21 MED ORDER — ACETAMINOPHEN 325 MG PO TABS: 650.0000 mg | ORAL_TABLET | Freq: Four times a day (QID) | ORAL | Status: DC | PRN

## 2020-10-21 MED ORDER — LORAZEPAM 2 MG/ML IJ SOLN
0.5000 mg | INTRAMUSCULAR | Status: DC | PRN
  Administered 2020-10-21: 2 mg via INTRAVENOUS
  Administered 2020-10-22 – 2020-10-23 (×2): 0.5 mg via INTRAVENOUS
  Filled 2020-10-21 (×4): qty 1

## 2020-10-21 MED ORDER — AMIODARONE HCL 200 MG PO TABS
200.0000 mg | ORAL_TABLET | Freq: Every day | ORAL | Status: DC
  Filled 2020-10-21: qty 1

## 2020-10-21 MED ORDER — MELATONIN 5 MG PO TABS
2.5000 mg | ORAL_TABLET | Freq: Every evening | ORAL | Status: DC | PRN
  Filled 2020-10-21: qty 0.5

## 2020-10-21 MED ORDER — FLUDEOXYGLUCOSE F - 18 (FDG) INJECTION
10.9700 | Freq: Once | INTRAVENOUS | Status: AC | PRN
  Administered 2020-10-21: 10.97 via INTRAVENOUS

## 2020-10-21 MED ORDER — ACETAMINOPHEN 650 MG RE SUPP: 650.0000 mg | Freq: Four times a day (QID) | RECTAL | Status: DC | PRN

## 2020-10-21 NOTE — Telephone Encounter (Signed)
Called Report IMPRESSION: 1. Findings that are suspicious for pulmonary neoplasm with lymphangitic carcinomatosis, neck and chest adenopathy, diffuse bony metastatic disease and metastatic disease to the adrenals and potentially liver. 2. Areas of diffuse uptake without discrete lesion but with a single lesion that can be visualized on CT images in the RIGHT hepatic lobe. Infiltrative neoplasm is considered. Consider follow-up MRI of the liver for further assessment. Nodularity of the liver also raising the question of background liver disease. 3. Focal area of increased uptake in the esophagus may represent a small lymph node adjacent to the esophagus. There is subtle increase in soft tissue without definite lesion in this location. Dedicated esophageal evaluation may be helpful as warranted. 4. C2 with increased FDG uptake, also some FDG uptake associated with degenerative changes in the middle lower cervical spine maximum SUV at C2 is 3.9  These results will be called to the ordering clinician or representative by the Radiologist Assistant, and communication documented in the PACS or [employer].   Electronically Signed   By: Zetta Bills M.D.   On: 10/21/2020 15:10

## 2020-10-21 NOTE — ED Notes (Signed)
MD called over with concerns about the pt, states she did not refer the pt to the ED referred the pt for palliative care due to aggressive CA and recommends if the pt needs to be admitted that a consult be ordered.

## 2020-10-21 NOTE — ED Provider Notes (Signed)
[facility] Emergency Department Provider Note    First MD Initiated Contact with Patient 10/21/20 1736     (approximate)  I have reviewed the triage vital signs and the nursing notes.   HISTORY  Chief Complaint Weakness    HPI [name] [name] is a 86 y.o. male with the below listed, very complex past medical history presenting the ER for progressively worsening weakness nausea vomiting decreased p.o. intake.  Followed by oncology for known malignancy not currently on chemotherapy or radiation therapy but being considered for immunotherapy.  Blood work done showing evidence of acute renal failure.  Due to his son declined patient was directed to the ER for admission for further medical management.    Past Medical History:  Diagnosis Date  . Arthritis   . Asymptomatic Sinus bradycardia   . CAD (coronary artery disease)    a. 10/2005 NSTEMI/PCI: LAD 95 (2.75x33 Cypher DES); b. 2008 Ant STEMI/PCI LAD 2/2 ISR; c. 09/2011 PCI OM2; d. 02/2019 MV: Fixed mid-dist ant/apical, mid-apical inferolateral dfects w/o ischemia; e. 12/2019 Cath: LM nl, LAD 25p/m, patent stent, D2 80, LCX nl, OM2 patent stent, RCA min irregs.  . CKD (chronic kidney disease), stage III ([place])   . HFrEF (heart failure with reduced ejection fraction) ([place])    a. 12/2019 Echo: EF 20-25%, glob HK. Mid-apical septum, inf, apical AK. Mildly reduced RV fxn. Triv MR.  Marland Kitchen Hyperlipidemia   . Hypertension   . Ischemic cardiomyopathy    a. 07/2015 Echo: EF 45-50%; b. 12/2019 Echo: EF 20-25%.  Marland Kitchen LBBB (left bundle branch block)   . Myocardial infarction ([place])   . Persistent atrial fibrillation ([place])    a.  Diagnosed January 2021.  CHA2DS2VASc = 5--> xarelto.  . Tobacco user    Remote   Family History  Problem Relation Age of Onset  . Parkinson's disease Mother   . Heart disease Father   . Ovarian cancer Sister    Past Surgical History:  Procedure Laterality Date  . CARDIAC CATHETERIZATION   10/21/2011   s/p stent placement  . CARDIAC CATHETERIZATION  2008   stent placement  . CARDIAC CATHETERIZATION  2006   stent placement  . CARDIOVERSION N/A 03/05/2020   Procedure: CARDIOVERSION;  Surgeon: Minna Merritts, MD;  Location: [facility] ORS;  Service: Cardiovascular;  Laterality: N/A;  . CARPAL TUNNEL RELEASE Right 07/22/2016   Procedure: CARPAL TUNNEL RELEASE;  Surgeon: Leanor Kail, MD;  Location: [facility] ORS;  Service: Orthopedics;  Laterality: Right;  . JOINT REPLACEMENT    . LEFT HEART CATH AND CORONARY ANGIOGRAPHY N/A 01/20/2020   Procedure: LEFT HEART CATH AND CORONARY ANGIOGRAPHY;  Surgeon: Jettie Booze, MD;  Location: [facility];  Service: Cardiovascular;  Laterality: N/A;  . TOTAL HIP ARTHROPLASTY     Right  . TOTAL KNEE ARTHROPLASTY Left 06/29/2018   Procedure: LEFT TOTAL KNEE ARTHROPLASTY;  Surgeon: Frederik Pear, MD;  Location: [place];  Service: Orthopedics;  Laterality: Left;  . TRANSURETHRAL RESECTION OF PROSTATE     Patient Active Problem List   Diagnosis Date Noted  . Primary adenocarcinoma of right lung ([place]) 10/07/2020  . Elevated troponin   . Chest pain 01/19/2020  . Hyperkalemia 01/19/2020  . Renal failure (ARF), acute on chronic ([place]) 01/19/2020  . Total knee replacement status, left 06/30/2018  . Primary osteoarthritis of left knee 06/29/2018  . Degenerative arthritis of left knee 06/27/2018  . GERD (gastroesophageal reflux disease) 09/21/2015  . Chronic renal  failure, stage 3 (moderate) ([place]) 09/21/2015  . Pain in the chest   . Shortness of breath 03/25/2015  . Hyperlipidemia 10/07/2009  . HYPERTENSION, BENIGN 10/07/2009  . NSTEMI (non-ST elevated myocardial infarction) ([place]) 10/07/2009  . CAD, NATIVE VESSEL 10/07/2009      Prior to Admission medications   Medication Sig Start Date End Date Taking? Authorizing Provider  allopurinol (ZYLOPRIM) 100 MG tablet daily. 05/29/20   [provider]  ALPRAZolam Duanne Moron) 0.25 MG tablet  Take 1 tablet (0.25 mg total) by mouth 2 (two) times daily as needed for anxiety. 10/20/20   Borders, Kirt Boys, NP  amiodarone (PACERONE) 200 MG tablet Take 1 tablet (200 mg total) by mouth daily. 09/03/20   Loel Dubonnet, NP  benzonatate (TESSALON) 200 MG capsule Take 200 mg by mouth 3 (three) times daily as needed. 10/08/20   [provider]  carvedilol (COREG) 6.25 MG tablet Take 1 tablet (6.25 mg total) by mouth 2 (two) times daily with a meal. 02/12/20   Theora Gianotti, NP  guaiFENesin-codeine (ROBITUSSIN AC) 100-10 MG/5ML syrup Take 5 mLs by mouth 3 (three) times daily as needed for cough. 10/07/20   Earlie Server, MD  hydrALAZINE (APRESOLINE) 10 MG tablet Take 1 tablet (10 mg total) by mouth 3 (three) times daily. Patient taking differently: Take 10 mg by mouth daily.  08/20/20   Loel Dubonnet, NP  isosorbide mononitrate (IMDUR) 30 MG 24 hr tablet Take 0.5 tablets (15 mg total) by mouth daily. 09/03/20 12/02/20  Loel Dubonnet, NP  Multiple Vitamin (MULTIVITAMIN WITH MINERALS) TABS tablet Take 2 tablets by mouth daily.    [provider]  nitroGLYCERIN (NITROSTAT) 0.4 MG SL tablet Place 0.4 mg under the tongue every 5 (five) minutes as needed for chest pain.    [provider]  ondansetron (ZOFRAN-ODT) 4 MG disintegrating tablet Take 1 tablet (4 mg total) by mouth every 8 (eight) hours as needed for nausea or vomiting. 10/16/20   Borders, Kirt Boys, NP  pantoprazole (PROTONIX) 40 MG tablet Take by mouth. 10/06/20 10/06/21  [provider]  Patiromer Sorbitex Calcium (VELTASSA PO) Take 1 Package by mouth once a week. Patient not taking: Reported on 10/07/2020    [provider]  predniSONE (DELTASONE) 50 MG tablet Take by mouth. Take 1 tablet (50 mg total) by mouth once daily Take 50mg  x 10d and then 25mg  x 4d 10/08/20   [provider]  Probiotic CAPS Take 1 capsule by mouth daily.    [provider]  Rivaroxaban (XARELTO)  15 MG TABS tablet Take 15 mg by mouth daily with supper.    [provider]  rosuvastatin (CRESTOR) 10 MG tablet Take 1 tablet (10 mg total) by mouth daily. 05/29/20 10/09/20  Marrianne Mood D, PA-C  sulfamethoxazole-trimethoprim (BACTRIM) 400-80 MG tablet Take by mouth. Take 1 tablet (80 mg of trimethoprim total) by mouth every 12 (twelve) hours for 14 days 10/08/20 10/22/20  [provider]  torsemide (DEMADEX) 20 MG tablet Take 1 tablet (20 mg total) by mouth as directed. Take 20 twice a day alternating with 20 once a day. 09/29/20   Minna Merritts, MD  triamcinolone cream (KENALOG) 0.1 % Apply 1 application topically daily as needed (itching).    [provider]    Allergies Penicillins    Social History Social History   Tobacco Use  . Smoking status: Former Smoker    Packs/day: 1.00    Years: 20.00  Pack years: 20.00    Types: Cigarettes    Quit date: 12/06/1968    Years since quitting: 51.9  . Smokeless tobacco: Never Used  Vaping Use  . Vaping Use: Never used  Substance Use Topics  . Alcohol use: No  . Drug use: No    Review of Systems Patient denies headaches, rhinorrhea, blurry vision, numbness, shortness of breath, chest pain, edema, cough, abdominal pain, nausea, vomiting, diarrhea, dysuria, fevers, rashes or hallucinations unless otherwise stated above in HPI. ____________________________________________   PHYSICAL EXAM:  VITAL SIGNS: Vitals:   10/21/20 1243 10/21/20 1244  BP:    Pulse:    Resp:  20  Temp: 97.9 F (36.6 C)   SpO2:      Constitutional: Alert and oriented.  Eyes: Conjunctivae are normal.  Head: Atraumatic. Nose: No congestion/rhinnorhea. Mouth/Throat: Mucous membranes are moist.   Neck: No stridor. Painless ROM.  Cardiovascular: Normal rate, regular rhythm. Grossly normal heart sounds.  Good peripheral circulation. Respiratory: Normal respiratory effort.  No retractions. Lungs CTAB. Gastrointestinal:  Soft and nontender. No distention. No abdominal bruits. No CVA tenderness. Genitourinary:  Musculoskeletal: No lower extremity tenderness nor edema.  No joint effusions. Neurologic:  Normal speech and language. No gross focal neurologic deficits are appreciated. No facial droop Skin:  Skin is warm, dry and intact. No rash noted. Psychiatric: Mood and affect are normal. Speech and behavior are normal.  ____________________________________________   LABS (all labs ordered are listed, but only abnormal results are displayed)  Results for orders placed or performed during the hospital encounter of 10/21/20 (from the past 24 hour(s))  Basic metabolic panel     Status: Abnormal   Collection Time: 10/21/20 12:36 PM  Result Value Ref Range   Sodium 131 (L) 135 - 145 mmol/L   Potassium 5.6 (H) 3.5 - 5.1 mmol/L   Chloride 95 (L) 98 - 111 mmol/L   CO2 23 22 - 32 mmol/L   Glucose, Bld 130 (H) 70 - 99 mg/dL   BUN 103 (H) 8 - 23 mg/dL   Creatinine, Ser 5.09 (H) 0.61 - 1.24 mg/dL   Calcium 8.7 (L) 8.9 - 10.3 mg/dL   GFR, Estimated 10 (L) >60 mL/min   Anion gap 13 5 - 15  CBC     Status: Abnormal   Collection Time: 10/21/20 12:36 PM  Result Value Ref Range   WBC 9.6 4.0 - 10.5 K/uL   RBC 4.91 4.22 - 5.81 MIL/uL   Hemoglobin 16.3 13.0 - 17.0 g/dL   HCT 49.0 39 - 52 %   MCV 99.8 80.0 - 100.0 fL   MCH 33.2 26.0 - 34.0 pg   MCHC 33.3 30.0 - 36.0 g/dL   RDW 16.6 (H) 11.5 - 15.5 %   Platelets 110 (L) 150 - 400 K/uL   nRBC 0.0 0.0 - 0.2 %   ____________________________________________  EKG My review and personal interpretation at Time: 12:28   Indication: weakness  Rate: 100  Rhythm: mobitz I av block Axis: normal Other: LBBB, abnml ekg ____________________________________________  RADIOLOGY  I personally reviewed all radiographic images ordered to evaluate for the above acute complaints and reviewed radiology reports and findings.  These findings were personally discussed with the  patient.  Please see medical record for radiology report.  ____________________________________________   PROCEDURES  Procedure(s) performed:  .Critical Care Performed by: Merlyn Lot, MD Authorized by: Merlyn Lot, MD   Critical care provider statement:    Critical care time (minutes):  34  Critical care was necessary to treat or prevent imminent or life-threatening deterioration of the following conditions:  Renal failure   Critical care was time spent personally by me on the following activities:  Discussions with consultants, evaluation of patient's response to treatment, examination of patient, ordering and performing treatments and interventions, ordering and review of laboratory studies, ordering and review of radiographic studies, pulse oximetry, re-evaluation of patient's condition, obtaining history from patient or surrogate and review of old charts      Critical Care performed: yes ____________________________________________   INITIAL IMPRESSION / ASSESSMENT AND PLAN / ED COURSE  Pertinent labs & imaging results that were available during my care of the patient were reviewed by me and considered in my medical decision making (see chart for details).   DDX: Dehydration, sepsis, pna, uti, hypoglycemia, cva, drug effect, withdrawal,   ARGIL MAHL is a 86 y.o. who presents to the ED with symptoms and presentation as described above.  Patient with very complex past medical history and oncological disease presenting for worsening weakness and signs of acute renal failure.  Does have evidence of Mobitz 1 AV block.  Denies any chest pain.  Does have evidence of acute renal failure with borderline hyperkalemia.  Will treat with IV fluids.  No sign of obstructive uropathy.  Likely significant dehydration possible cardiorenal syndrome as he has a history of CHF though he does not appear to be in acute pulmonary edema.  Will discuss with hospitalist for admission.       The patient was evaluated in Emergency Department today for the symptoms described in the history of present illness. He/she was evaluated in the context of the global COVID-19 pandemic, which necessitated consideration that the patient might be at risk for infection with the SARS-CoV-2 virus that causes COVID-19. Institutional protocols and algorithms that pertain to the evaluation of patients at risk for COVID-19 are in a state of rapid change based on information released by regulatory bodies including the CDC and federal and state organizations. These policies and algorithms were followed during the patient's care in the ED.  As part of my medical decision making, I reviewed the following data within the [place] notes reviewed and incorporated, Labs reviewed, notes from prior ED visits and [place] Controlled Substance Database   ____________________________________________   FINAL CLINICAL IMPRESSION(S) / ED DIAGNOSES  Final diagnoses:  Weakness  Acute renal failure, unspecified acute renal failure type ([place])      NEW MEDICATIONS STARTED DURING THIS VISIT:  New Prescriptions   No medications on file     Note:  This document was prepared using Dragon voice recognition software and may include unintentional dictation errors.    Merlyn Lot, MD 10/21/20 [phone]

## 2020-10-21 NOTE — H&P (Signed)
History and Physical    PLEASE NOTE THAT DRAGON DICTATION SOFTWARE WAS USED IN THE CONSTRUCTION OF THIS NOTE.   [name] [name] HAL:[id number] DOB: 07-24-1931 DOA: 10/21/2020  PCP: [name], MD Patient coming from: home   I have personally briefly reviewed patient's old medical records in [place]  Chief Complaint: Generalized weakness  HPI: [name] [name] is a 86 y.o. male with medical history significant for primary adenocarcinoma of the right lung, chronic systolic heart failure with most recent echocardiogram in April showing EF 25 to 30%, paroxysmal atrial fibrillation chronically anticoagulated on Xarelto, hypertension, speech for chronic kidney disease with baseline creatinine 2.8-3.2, who is admitted to [facility] on 10/21/2020 with acute renal failure superimposed on stage IV chronic kidney disease after presenting from home to [facility] Emergency Department complaining of generalized weakness.   The patient reports 3 to 4 days of generalized weakness in the absence of any acute focal weakness or acute focal change in sensation. He notes intermittent nausea and vomiting over the course of the last week, which preceded the onset of the generalized weakness which she presents this evening. Over that timeframe, notes 2 episodes episodes. Of nonbloody, nonbilious emesis. Over the last 7 to 10 days, he also reports significant decline in his oral intake with contributions from the intermittent nausea/vomiting as well as an overall decline in his appetite. Denies any associated abdominal discomfort diarrhea, or new rash. Denies any associated chest pain, shortness of breath, palpitations, diaphoresis.   Earlier today the patient presented for routine follow-up at his oncologist office following PET scan, at which time the patient's oncologist Dr. Talbert Cage instructed the patient to present to the emergency department for further evaluation and  management of generalized weakness in the setting of interval decline in oral intake.   He denies any recent subjective fever, chills, rigors, or generalized myalgias. Denies any rhinitis rhinorrhea, sore throat, shortness of breath or cough. Also denies any recent dysuria, gross hematuria, or change in urinary urgency/frequency. He confirms having just completed a 14-day course of Bactrim on 03/2020, although he does not recall the specific underlying indication for this course of antibiotics. The patient conveys good compliance with outpatient medication regimen in spite of recent nausea/vomiting, including continuation of his torsemide regimen. Over the last week, on a significant increase in the edema present in his bilateral lower extremities. Denies any recent orthopnea or PND.   Per chart review, it does not appear that the patient is currently on chemotherapy or radiation for his primary adenocarcinoma of the right lung, although it appears that there is some consideration to initiation of immunotherapy.    ED Course:  Vital signs in the ED were notable for the following: [name] [name] 97.9; heart rate 56; blood pressure 105/51, respiratory rate 20, oxygen saturation 93% on room air  Labs were notable for the following: Presenting BMP notable for the following: Sodium 131 relative to most recent prior sodium value 134 on 10/07/2020, potassium 5.6 relative to 5.2 10/07/2020, chloride 95 bicarbonate 23, anion gap 13, BUN 23, creatinine 5.09 compared to most recent prior value of 3.27 on 10/07/2020. CBC notable for white blood cell count of 9600. Urinalysis has been ordered, with result currently pending. COVID-19 PCR along with influenza PCR have been checked in the ED today, with results currently pending.  Chest x-ray showed no interval change relative to plain films of the chest on 10/15/2020, but rather showed an unchanged right perihilar density  as well as an unchanged small right pleural  effusion in the absence of any infiltrate or pneumothorax. Plain films of the abdomen showed no evidence of bowel obstruction or free air. EKG showed sinus tachycardia second-degree AV block Mobitz type I, ventricular rate 99, known left bundle branch block.  The patient was subsequently admitted to the stepdown unit for further evaluation and management of acute renal failure superimposed on stage IV chronic kidney disease as well as hyperkalemia.    Review of Systems: As per HPI otherwise 10 point review of systems negative.   Past Medical History:  Diagnosis Date  . Arthritis   . Asymptomatic Sinus bradycardia   . CAD (coronary artery disease)    a. 10/2005 NSTEMI/PCI: LAD 95 (2.75x33 Cypher DES); b. 2008 Ant STEMI/PCI LAD 2/2 ISR; c. 09/2011 PCI OM2; d. 02/2019 MV: Fixed mid-dist ant/apical, mid-apical inferolateral dfects w/o ischemia; e. 12/2019 Cath: LM nl, LAD 25p/m, patent stent, D2 80, LCX nl, OM2 patent stent, RCA min irregs.  . CKD (chronic kidney disease), stage III ([place])   . HFrEF (heart failure with reduced ejection fraction) ([place])    a. 12/2019 Echo: EF 20-25%, glob HK. Mid-apical septum, inf, apical AK. Mildly reduced RV fxn. Triv MR.  Marland Kitchen Hyperlipidemia   . Hypertension   . Ischemic cardiomyopathy    a. 07/2015 Echo: EF 45-50%; b. 12/2019 Echo: EF 20-25%.  Marland Kitchen LBBB (left bundle branch block)   . Myocardial infarction ([place])   . Persistent atrial fibrillation ([place])    a.  Diagnosed January 2021.  CHA2DS2VASc = 5--> xarelto.  . Tobacco user    Remote    Past Surgical History:  Procedure Laterality Date  . CARDIAC CATHETERIZATION  10/21/2011   s/p stent placement  . CARDIAC CATHETERIZATION  2008   stent placement  . CARDIAC CATHETERIZATION  2006   stent placement  . CARDIOVERSION N/A 03/05/2020   Procedure: CARDIOVERSION;  Surgeon: Minna Merritts, MD;  Location: [facility] ORS;  Service: Cardiovascular;  Laterality: N/A;  . CARPAL TUNNEL RELEASE Right 07/22/2016   Procedure:  CARPAL TUNNEL RELEASE;  Surgeon: Leanor Kail, MD;  Location: [facility] ORS;  Service: Orthopedics;  Laterality: Right;  . JOINT REPLACEMENT    . LEFT HEART CATH AND CORONARY ANGIOGRAPHY N/A 01/20/2020   Procedure: LEFT HEART CATH AND CORONARY ANGIOGRAPHY;  Surgeon: Jettie Booze, MD;  Location: [place] CV LAB;  Service: Cardiovascular;  Laterality: N/A;  . TOTAL HIP ARTHROPLASTY     Right  . TOTAL KNEE ARTHROPLASTY Left 06/29/2018   Procedure: LEFT TOTAL KNEE ARTHROPLASTY;  Surgeon: Frederik Pear, MD;  Location: [place];  Service: Orthopedics;  Laterality: Left;  . TRANSURETHRAL RESECTION OF PROSTATE      Social History:  reports that he quit smoking about 51 years ago. His smoking use included cigarettes. He has a 20.00 pack-year smoking history. He has never used smokeless tobacco. He reports that he does not drink alcohol and does not use drugs.   Allergies  Allergen Reactions  . Penicillins Rash    Did it involve swelling of the face/tongue/throat, SOB, or low BP? No Did it involve sudden or severe rash/hives, skin peeling, or any reaction on the inside of your mouth or nose? No Did you need to seek medical attention at a hospital or doctor's office? No When did it last happen?2-3 years If all above answers are "NO", may proceed with cephalosporin use.     Family History  Problem Relation Age of Onset  .  Parkinson's disease Mother   . Heart disease Father   . Ovarian cancer Sister     Prior to Admission medications   Medication Sig Start Date End Date Taking? Authorizing Provider  allopurinol (ZYLOPRIM) 100 MG tablet daily. 05/29/20   [provider]  ALPRAZolam Duanne Moron) 0.25 MG tablet Take 1 tablet (0.25 mg total) by mouth 2 (two) times daily as needed for anxiety. 10/20/20   Borders, Kirt Boys, NP  amiodarone (PACERONE) 200 MG tablet Take 1 tablet (200 mg total) by mouth daily. 09/03/20   Loel Dubonnet, NP  benzonatate (TESSALON) 200 MG capsule Take 200 mg  by mouth 3 (three) times daily as needed. 10/08/20   [provider]  carvedilol (COREG) 6.25 MG tablet Take 1 tablet (6.25 mg total) by mouth 2 (two) times daily with a meal. 02/12/20   Theora Gianotti, NP  guaiFENesin-codeine (ROBITUSSIN AC) 100-10 MG/5ML syrup Take 5 mLs by mouth 3 (three) times daily as needed for cough. 10/07/20   Earlie Server, MD  hydrALAZINE (APRESOLINE) 10 MG tablet Take 1 tablet (10 mg total) by mouth 3 (three) times daily. Patient taking differently: Take 10 mg by mouth daily.  08/20/20   Loel Dubonnet, NP  isosorbide mononitrate (IMDUR) 30 MG 24 hr tablet Take 0.5 tablets (15 mg total) by mouth daily. 09/03/20 12/02/20  Loel Dubonnet, NP  Multiple Vitamin (MULTIVITAMIN WITH MINERALS) TABS tablet Take 2 tablets by mouth daily.    [provider]  nitroGLYCERIN (NITROSTAT) 0.4 MG SL tablet Place 0.4 mg under the tongue every 5 (five) minutes as needed for chest pain.    [provider]  ondansetron (ZOFRAN-ODT) 4 MG disintegrating tablet Take 1 tablet (4 mg total) by mouth every 8 (eight) hours as needed for nausea or vomiting. 10/16/20   Borders, Kirt Boys, NP  pantoprazole (PROTONIX) 40 MG tablet Take by mouth. 10/06/20 10/06/21  [provider]  Patiromer Sorbitex Calcium (VELTASSA PO) Take 1 Package by mouth once a week. Patient not taking: Reported on 10/07/2020    [provider]  predniSONE (DELTASONE) 50 MG tablet Take by mouth. Take 1 tablet (50 mg total) by mouth once daily Take 50mg  x 10d and then 25mg  x 4d 10/08/20   [provider]  Probiotic CAPS Take 1 capsule by mouth daily.    [provider]  Rivaroxaban (XARELTO) 15 MG TABS tablet Take 15 mg by mouth daily with supper.    [provider]  rosuvastatin (CRESTOR) 10 MG tablet Take 1 tablet (10 mg total) by mouth daily. 05/29/20 10/09/20  Marrianne Mood D, PA-C  sulfamethoxazole-trimethoprim (BACTRIM) 400-80 MG tablet Take by  mouth. Take 1 tablet (80 mg of trimethoprim total) by mouth every 12 (twelve) hours for 14 days 10/08/20 10/22/20  [provider]  torsemide (DEMADEX) 20 MG tablet Take 1 tablet (20 mg total) by mouth as directed. Take 20 twice a day alternating with 20 once a day. 09/29/20   Minna Merritts, MD  triamcinolone cream (KENALOG) 0.1 % Apply 1 application topically daily as needed (itching).    [provider]     Objective    Physical Exam: Vitals:   10/21/20 1229 10/21/20 1230 10/21/20 1243 10/21/20 1244  BP: (!) 105/51     Pulse: (!) 56     Resp:    20  Temp:   97.9 F (36.6 C)   TempSrc:   Rectal   SpO2: 93%     Weight:  91 kg    Height:  5\' 6"  (1.676 m)      General: appears to be stated age; alert, oriented Skin: warm, dry; hyperpigmentation noted to be associated with the anterior surface of the bilateral lower extremities distal to the bilateral knees. Head:  AT/[place] Mouth:  Oral mucosa membranes appear dry, normal dentition Neck: supple; trachea midline Heart:  RRR; did not appreciate any M/R/G Lungs: CTAB, did not appreciate any wheezes, rales, or rhonchi Abdomen: + BS; soft, ND, NT Vascular: 2+ pedal pulses b/l; 2+ radial pulses b/l Extremities: 1-2+ edema in the bilateral lower extremities along with aforementioned hyperpigmentation; no muscle wasting. Neuro: strength and sensation intact in upper and lower extremities b/l    Labs on Admission: I have personally reviewed following labs and imaging studies  CBC: Recent Labs  Lab 10/21/20 1236  WBC 9.6  HGB 16.3  HCT 49.0  MCV 99.8  PLT 841*   Basic Metabolic Panel: Recent Labs  Lab 10/21/20 1236  NA 131*  K 5.6*  CL 95*  CO2 23  GLUCOSE 130*  BUN 103*  CREATININE 5.09*  CALCIUM 8.7*   GFR: Estimated Creatinine Clearance: 10.8 mL/min (A) (by C-G formula based on SCr of 5.09 mg/dL (H)). Liver Function Tests: No results for input(s): AST, ALT, ALKPHOS, BILITOT, PROT, ALBUMIN in  the last 168 hours. No results for input(s): LIPASE, AMYLASE in the last 168 hours. No results for input(s): AMMONIA in the last 168 hours. Coagulation Profile: No results for input(s): INR, PROTIME in the last 168 hours. Cardiac Enzymes: No results for input(s): CKTOTAL, CKMB, CKMBINDEX, TROPONINI in the last 168 hours. BNP (last 3 results) No results for input(s): PROBNP in the last 8760 hours. HbA1C: No results for input(s): HGBA1C in the last 72 hours. CBG: Recent Labs  Lab 10/21/20 0954  GLUCAP 102*   Lipid Profile: No results for input(s): CHOL, HDL, LDLCALC, TRIG, CHOLHDL, LDLDIRECT in the last 72 hours. Thyroid Function Tests: No results for input(s): TSH, T4TOTAL, FREET4, T3FREE, THYROIDAB in the last 72 hours. Anemia Panel: No results for input(s): VITAMINB12, FOLATE, FERRITIN, TIBC, IRON, RETICCTPCT in the last 72 hours. Urine analysis:    Component Value Date/Time   COLORURINE YELLOW 06/20/2018 1032   APPEARANCEUR CLEAR 06/20/2018 1032   LABSPEC 1.015 06/20/2018 1032   PHURINE 5.0 06/20/2018 1032   GLUCOSEU NEGATIVE 06/20/2018 1032   HGBUR SMALL (A) 06/20/2018 1032   BILIRUBINUR NEGATIVE 06/20/2018 1032   KETONESUR NEGATIVE 06/20/2018 1032   PROTEINUR NEGATIVE 06/20/2018 1032   UROBILINOGEN 0.2 08/03/2015 0925   NITRITE NEGATIVE 06/20/2018 1032   LEUKOCYTESUR NEGATIVE 06/20/2018 1032    Radiological Exams on Admission: US Renal  Result Date: 10/21/2020 CLINICAL DATA:  Acute renal failure EXAM: RENAL / URINARY TRACT ULTRASOUND COMPLETE COMPARISON:  04/03/2020 FINDINGS: Right Kidney: Renal measurements: 8.4 x 5.5 x 4 cm = volume: 97 mL. There is increased cortical echogenicity without evidence for hydronephrosis. There are few small cortical cysts. Left Kidney: Renal measurements: 9.8 x 5.6 x 4.4 cm = volume: 125 mL. There is no hydronephrosis. There are small cysts. There is increased cortical echogenicity. Bladder: Appears normal for degree of bladder  distention. Other: None. IMPRESSION: Echogenic kidneys bilaterally which can be seen in patients with medical renal disease. No hydronephrosis. Electronically Signed   By: Constance Holster M.D.   On: 10/21/2020 18:57   NM PET Image Initial (PI) Skull Base To Thigh  Result Date: 10/21/2020 CLINICAL DATA:  Initial treatment strategy for adenocarcinoma  of the RIGHT lung. EXAM: NUCLEAR MEDICINE PET SKULL BASE TO THIGH TECHNIQUE: 10.97 mCi F-18 FDG was injected intravenously. Full-ring PET imaging was performed from the skull base to thigh after the radiotracer. CT data was obtained and used for attenuation correction and anatomic localization. Fasting blood glucose: 102 mg/dl COMPARISON:  Chest CT of September 26, 2020 FINDINGS: Mediastinal blood pool activity: SUV [name] 3.0 Liver activity: SUV [name] NA NECK: Hypermetabolic LEFT supraclavicular lymph node (image 59, series 3) approximately 6 mm with SUV value of approximately 4.5 Small RIGHT level 2 lymph node (image 38, series 3) (SUVmax = 3.2) Another adjacent subcentimeter RIGHT level 2 lymph node with similar FDG uptake. Incidental CT findings: none CHEST: Extensive increased FDG uptake throughout the chest. Diffuse septal thickening in the RIGHT perihilar region involving RIGHT upper lobe and RIGHT middle lobe. Nodule in the RIGHT upper lobe on image 88 of series 3 is intensely FDG avid with an SUV of 8.9 FDG uptake is seen that is above blood pool in other areas of consolidative change in septal thickening throughout the RIGHT chest., for instance on image 101 of series 3 consolidative areas extending anteriorly into the RIGHT upper lobe with an SUV of 5.0 RIGHT hilar adenopathy (image 99, series 3) (SUVmax = 9.0) Subcarinal adenopathy with increased FDG uptake as well (image 99, series 3) 8 mm with a maximum SUV of 7.8 Unenlarged but mildly hypermetabolic lymph nodes elsewhere in the chest. Focal hypermetabolic activity on image 104 of series 3 corresponds to the  esophagus without definite visible lesion in this area, perhaps a small lymph node or small esophageal lesion with a maximum SUV of 5.9. There is subtle change in density at the level of increased metabolic activity of uncertain significance. Scattered nonspecific LEFT axillary lymph nodes with mild increased FDG uptake. Incidental CT findings: Calcified atheromatous plaque in the thoracic aorta. Three-vessel coronary artery disease. Heart size mildly enlarged without pericardial effusion. Diffuse nodular septal thickening in the RIGHT chest and areas of consolidative change. Moderate RIGHT-sided loculated pleural fluid.  Pulmonary emphysema. Small nodule in the LEFT lung base 6 mm without definitive signs of FDG uptake, certainly not above blood pool. ABDOMEN/PELVIS: Nodular contour of the liver. Focal area of increased FDG uptake near the dome of the RIGHT hemi liver (image 31, series 3) (SUVmax = 8.8) Diffuse increased FDG uptake throughout the nodular appearing LEFT hepatic lobe without clear signs of discrete mass on noncontrast imaging. Similar pattern in the RIGHT hepatic lobe posteriorly mainly hepatic subsegment VII and VI also without discrete focal hepatic lesion aside from a single lesion on image 157 of series 3. Note that for reference what appears to represent normal background hepatic activity is 4.8 with 10.2 maximum SUV in posterior RIGHT hepatic lobe and similar FDG uptake in the LEFT hepatic lobe. LEFT adrenal nodularity with intense FDG uptake, nodularity on the RIGHT with less FDG uptake. LEFT adrenal lesion approximately 13 mm with a maximum SUV of 6.8 no adenopathy in the abdomen with increased metabolic activity. No adenopathy in the pelvis. Incidental CT findings: Cystic renal lesions and lesions of varying complexity of the bilateral kidneys without signs of focal, suspicious FDG uptake. Mild parenchymal thinning. No hydronephrosis. No pericholecystic stranding. Normal spleen. Normal  pancreas. Calcified atheromatous plaque in the abdominal aorta. No aneurysmal dilation. Prostate grossly unremarkable though obscured by streak artifact from bilateral hip arthroplasty changes. SKELETON: Diffuse bony metastatic disease. Multifocal lesions in bilateral ribs. Lesions at multiple levels throughout the spine  with involvement of T1 through T12 mainly in anterior elements. Posterior elements at T5 and T6 are involved in also at T12. All lumbar levels with varying degrees of involvement with diffuse involvement of L2, L3 and in particular. Diffuse involvement of the body of the sternum with a maximum SUV of 8.1. T10 with maximum SUV of 8.2. L2 with a maximum SUV of 8.9 Diffuse central upper and RIGHT sacral involvement (image 212) on fused dataset. Subtle heterogeneity in this location without discrete lesion. Maximum SUV of approximately 8.2. Scattered lesions throughout the remainder of the pelvis, LEFT iliac along the LEFT iliac wing and RIGHT posterior iliac bone. Many of the above lesions display subtly lytic characteristics or subtly sclerotic characteristics but many are nearly occult from a radiographic perspective on CT images. Chronic appearing L1 compression deformity. Incidental CT findings: Multilevel spinal degenerative changes. IMPRESSION: 1. Findings that are suspicious for pulmonary neoplasm with lymphangitic carcinomatosis, neck and chest adenopathy, diffuse bony metastatic disease and metastatic disease to the adrenals and potentially liver. 2. Areas of diffuse uptake without discrete lesion but with a single lesion that can be visualized on CT images in the RIGHT hepatic lobe. Infiltrative neoplasm is considered. Consider follow-up MRI of the liver for further assessment. Nodularity of the liver also raising the question of background liver disease. 3. Focal area of increased uptake in the esophagus may represent a small lymph node adjacent to the esophagus. There is subtle increase in  soft tissue without definite lesion in this location. Dedicated esophageal evaluation may be helpful as warranted. 4. C2 with increased FDG uptake, also some FDG uptake associated with degenerative changes in the middle lower cervical spine maximum SUV at C2 is 3.9 These results will be called to the ordering clinician or representative by the Radiologist Assistant, and communication documented in the PACS or [employer]. Electronically Signed   By: Zetta Bills M.D.   On: 10/21/2020 15:10   DG Abdomen Acute W/Chest  Result Date: 10/21/2020 CLINICAL DATA:  86 year old male with weakness, nausea vomiting. EXAM: DG ABDOMEN ACUTE WITH 1 VIEW CHEST COMPARISON:  Chest radiograph dated 10/15/2020. FINDINGS: Small right pleural effusion. Right perihilar streaky densities again noted. No new consolidation. There is background of emphysema. No pneumothorax. The cardiac silhouette is within limits. There is moderate stool throughout the colon. No bowel dilatation or evidence of obstruction. No free air or radiopaque calculi. Degenerative changes of the spine. Bilateral hip arthroplasties. No acute osseous pathology. IMPRESSION: 1. Right perihilar density as seen on the earlier radiograph. No pneumothorax. 2. Small right pleural effusion. 3. Emphysema. 4. Constipation.  No bowel obstruction. Electronically Signed   By: Anner Crete M.D.   On: 10/21/2020 19:01     EKG: Independently reviewed, with result as described above.    Assessment/Plan   ZYDEN SUMAN is a 86 y.o. male with medical history significant for primary adenocarcinoma of the right lung, chronic systolic heart failure with most recent echocardiogram in April showing EF 25 to 30%, paroxysmal atrial fibrillation chronically anticoagulated on Xarelto, hypertension, speech for chronic kidney disease with baseline creatinine 2.8-3.2, who is admitted to [facility] on 10/21/2020 with acute renal failure superimposed on  stage IV chronic kidney disease after presenting from home to [facility] Emergency Department complaining of generalized weakness.    Principal Problem:   AKI (acute kidney injury) ([place]) Active Problems:   HYPERTENSION, BENIGN   Hyperkalemia   Renal failure (ARF), acute on chronic ([place])  Nausea & vomiting   Generalized weakness   Hyponatremia    #) Acute renal failure superimposed on stage IV chronic kidney disease: In the context of baseline creatinine range of 2.8-3.2, with most recent prior serum creatinine data point 3.27 on 10/07/2020, labs performed today reflect interval increase in serum creatinine to 5.09. Suspect that this is prerenal in the setting of intravascular lesion due to significant decline in oral intake over the last 10 days with additional contribution from increased GI losses associated with multiple daily episodes of nausea/vomiting over that timeframe while maintaining good compliance with outpatient diuretic regimen that was reportedly obtaining reasonable euvolemia leading up to these changes in oral intake/GI losses. Suspect additional contribution from interval 10-14 course of Bactrim completed on 10/18/2020, given that this antibiotic was started right around 10/07/2020, which represented the date of the most recent prior serum creatinine data point. No clinical or radiographic evidence to suggest acutely decompensated heart failure in the interval to cause a contributory decline in renal perfusion gradient.   In the context of suspected intravascular depletion contributing to presenting acute renal failure, will proceed with very gentle IV fluids in the form of lactated Ringer's at 75 cc/h times a 12-hour time, with plan to repeat BMP in a few hours to evaluate for any interval improvement in renal function. Should renal function began to improve, can consider nephrology consultation. At this time, there do not appear to be any indications for urgent dialysis,  although will closely watch ensuing trend in serum potassium.    Plan: Gentle lactated Ringer's, as above. Repeat BMP in the morning, with consideration for consultation of nephrology if renal function following these fluid resuscitative measures. will follow for results of urinalysis with with microscopy for presence of urinary gas. We will also check random urine sodium as well as random urine creatinine for purpose of calculating Fina. Check CPK. Monitor strict I's and O's and daily weights. Attempt to avoid nephrotoxic agents. In setting of suspected intravascular depletion, will hold torsemide for now, with plan to reevaluate volume status in the morning. Add on BNP. As needed IV Ativan for residual/vomiting.      #) Hyperkalemia: Presenting labs reflect potassium of 5.6, compared to most recent prior value of 5.2 on 10/07/2020. Suspect multifactorial contributions increase in serum potassium potential contributions from interval Bactrim given the potassium sparing diuretic characteristics of this medication interval increase in serum potassium in the basis of interval decline in renal function, as further described above. Gentle IV fluids in the form of lactated Ringer's at 75 cc/h overnight. Repeat BMP at 1 AM, with consideration for Kayexalate if no improvement in serum potassium level. Considered providing a dose of calcium gluconate, however, I am concerned exacerbating current bradycardia. There may also be an additional pharmacologic contribution to patient's hyperkalemia from outpatient Coreg, although this would represent a more chronic contribution given his longevity in which she has been on this medication.  Plan: Gentle IV fluids as above. Repeat BMP at 1 AM, with consideration for administration of Kayexalate if no interval improvement. Repeat BMP in the morning. Check serum magnesium level. Monitor on telemetry. Hold home Coreg given the potential for this nonspecific beta-blocker.       #) Generalized weakness: 3 to 4 days of generalized weakness in the neurologic deficits. This appears to be multifactorial in nature, with contributions from dehydration and resultant interval decline in renal function, as above. These factors are likely near the threshold for decompensation from a functionality  standpoint given underlying malignancy. No evidence of underlying infectious process at this time, although urinalysis and COVID-19 PCR/influenza PCR currently pending. Suspect additional contribution from suboptimal nutrition given decline in oral intake.  Plan: IV fluids as above. Work-up and management acute renal failure, as above check TSH, B12. I have placed a physical therapy consult order for the morning. Follow-up for results of urinalysis as well as COVID-19 PCR/influenza PCR. I have placed a consult with the dietitian for recommendations regarding optimization patient's nutritional status via potential addition of protein/calorie dense supplements.     #) Acute on chronic hypoosmolar hyponatremia: Presenting serum sodium of 131. Most recent prior value 134 on 10/07/2020. Suspect that interval decline is on the basis acute on chronic hypoosmolar hypovolemic hyponatremia due to dehydration from extrarenal losses in the setting of interval nausea/vomiting, and further complicated by diminished oral intake over that time while maintaining the same diuretic regimen that was apparently maintaining euvolemia leading up to onset of GI losses. No clinical or radiographic evidence to suggest acutely decompensated or contribution to acute exacerbation of hyponatremia from this mechanism. Overall, fluids, and repeat BMP to evaluate for interval change in serum sodium level.  Plan: Check urinalysis. Check random urine sodium as well as random urine osmolality. Check serum osmolality to confirm suspected hypoosmolar etiology. Check TSH. Lactated Ringer's, as above, with Pete BMP in the  morning. Torsemide for now, with plan to reevaluate volume status. Check BMP NP to further support the suspicion of the an element of acute decompensated heart failure at this time.       #) Paroxysmal atrial fibrillation: Documented history of such. In the setting of a CHA2DS2-VASc score of 5 , there is an indication for the patient to be on chronic anticoagulation for thromboembolic prophylaxis. Consistent with this, the patient is chronically anticoagulated on Xarelto. Home AV nodal blocking regimen: Coreg.  Most recent echocardiogram was performed in April 2021, with results of the study further detailed above. Interval development of acute renal failure is notable given chronic anticoagulation with Xarelto interval development of such represents a contraindication to continuation. As result, will hold Xarelto, and initiate subcutaneous heparin, will closely monitoring ensuing renal function for potential resumption of Xarelto if subsequent improvement in kidney function. Additionally, the context of hyperkalemia, holding evening dose of Coreg. Is also on amiodarone.   Plan: monitor strict I's & O's and daily weights. Repeat BMP in the morning. Check serum magnesium level. Hold home Coreg, as above. May consider switching Coreg to metoprolol succinate, which would maintain mortality benefit associated with chronic systolic heart failure without having associated hyperkalemic influence. Continue amiodarone.      #) Hypertension: Outpatient antihypertensive regimen includes the following: Coreg, hydralazine, indoor, along with torsemide. Borderline hypotensive at presentation, with systolic blood pressures in the low 100s mmHg, with suspected presenting intravascular depletion.  Plan: In the setting of presenting soft blood pressures, will hydralazine and Imdur for now, will provide gentle IV fluids following which interval trend of blood pressure with the possibility of resuming the hypertensive  medications. Holding home Coreg for now in the context of presenting hyperkalemia, as above. Holding overnight torsemide suspect intravascular depletion. Monitor strict I's and O's. Close monitoring of ensuing blood pressure via routine vital signs.      #) GERD: On Protonix as an outpatient.  Plan: will hold Protonix for now given potential side effect of nausea, particularly given the patient's presentation of 1 week daily nausea/vomiting.      #) Generalized  Anxiety disorder: on as needed Xanax as an outpatient.  Plan: Given presenting nausea/vomiting as needed Xanax. Instead I placed order for reports of anxiety-like benefits as well as antiemetic properties.     #) Primary adenocarcinoma of the right lung: Follows with Dr. Talbert Cage as as outpatient oncologist. As described above, it does not appear that the patient is currently receiving chemotherapy or radiation, although it appears that there been discussions about potential initiation of immunotherapy. He underwent earlier today, with result currently pending. Of note, per my discussions with the patient this evening, he wishes to change his CODE STATUS to DNR/DNI  Plan:will place palliative care consult for assistance with clarification of goals of care. CODE STATUS changed to DNR/DNI per my discussions with the patient this evening.     DVT prophylaxis: Heparin 5000 units subcu 3 times daily (holding home Xarelto in the context of interval development of acute renal failure, as further described above) Code Status: Per my discussions with the patient this evening, he clearly conveys his wish to be DNR/DNI. Family Communication: none Disposition Plan: Per Rounding Team Consults called: none  Admission status: Inpatient; stepdown unit.     PLEASE NOTE THAT DRAGON DICTATION SOFTWARE WAS USED IN THE CONSTRUCTION OF THIS NOTE.   [facility] Pager [phone] From 12PM- 12AM  Otherwise, please  contact night-coverage  [email or web address] Password [facility]  10/21/2020, 7:21 PM

## 2020-10-21 NOTE — Telephone Encounter (Signed)
I received an email from patient's wife last night stating that patient was declining and family were questioning hospitalization.  I tried to call wife but was unable to reach her.  I tried again several times this morning and was able to reach her by cell phone.  She says the patient is feeling better this morning.  We discussed options of sending him to the ER versus clinic evaluation versus hospice enrollment.  She spoke with patient and says that he still wants to obtain the PET scan this morning and will let us know what he decides to do afterwards.

## 2020-10-21 NOTE — ED Triage Notes (Signed)
Pt to ED POV c/o weakness from "sometime ago" and stating he is here to be admitted.  Pt states he had a brain scan today and was told to come to the ER for weakness.  Pt alert and oriented, clear speech. Lung cancer pt, not taking chemo

## 2020-10-21 NOTE — ED Notes (Signed)
US at bedside

## 2020-10-21 NOTE — Telephone Encounter (Signed)
PET scan results were reviewed. Also received phone call from patient's wife notifying that patient went to emergency room and is not feeling well. I called patient's wife and communicated with her about the PET scan results. PET scan showed widely metastatic lung cancer.  Patient appears to have deteriorated since the time he saw me.  Patient also has multiple medical problems including CHF, cardiomyopathy, CKD, A. fib, hypertension etc.  I doubt he been a candidate of any aggressive chemotherapy treatments.  I have sent NGS panel which is still pending. Depending on his PD-L1 status, he may qualify for single agent immunotherapy treatments however he still need to have acceptable performance status prior to the immunotherapy treatments.  I am concerned that he is not even strong enough to do single agent immunotherapy at this point.  I have called triage nurse and discussed with Stephanie.  Recommend patient to be admitted and oncology and palliative care service to be consulted for further discussion.  Prognosis is extremely poor and I recommend patient and the family to consider comfort care/hospice.  Patient's wife says that patient's grandson also wanted be part of the discussion.  I will see patient at the bedside tomorrow and then. 

## 2020-10-21 NOTE — ED Notes (Signed)
Bladder scan 47ml noted. Provider aware.

## 2020-10-22 ENCOUNTER — Ambulatory Visit: Payer: PPO

## 2020-10-22 ENCOUNTER — Encounter: Payer: Self-pay | Admitting: *Deleted

## 2020-10-22 ENCOUNTER — Other Ambulatory Visit: Payer: Self-pay

## 2020-10-22 DIAGNOSIS — N179 Acute kidney failure, unspecified: Principal | ICD-10-CM

## 2020-10-22 DIAGNOSIS — C3490 Malignant neoplasm of unspecified part of unspecified bronchus or lung: Secondary | ICD-10-CM | POA: Diagnosis not present

## 2020-10-22 DIAGNOSIS — I480 Paroxysmal atrial fibrillation: Secondary | ICD-10-CM

## 2020-10-22 DIAGNOSIS — E875 Hyperkalemia: Secondary | ICD-10-CM

## 2020-10-22 DIAGNOSIS — Z7189 Other specified counseling: Secondary | ICD-10-CM

## 2020-10-22 DIAGNOSIS — N189 Chronic kidney disease, unspecified: Secondary | ICD-10-CM

## 2020-10-22 DIAGNOSIS — Z515 Encounter for palliative care: Secondary | ICD-10-CM | POA: Diagnosis not present

## 2020-10-22 DIAGNOSIS — R112 Nausea with vomiting, unspecified: Secondary | ICD-10-CM

## 2020-10-22 LAB — TSH: TSH: 2.23 u[IU]/mL (ref 0.350–4.500)

## 2020-10-22 LAB — COMPREHENSIVE METABOLIC PANEL
ALT: 48 U/L — ABNORMAL HIGH (ref 0–44)
AST: 56 U/L — ABNORMAL HIGH (ref 15–41)
Albumin: 3 g/dL — ABNORMAL LOW (ref 3.5–5.0)
Alkaline Phosphatase: 141 U/L — ABNORMAL HIGH (ref 38–126)
Anion gap: 11 (ref 5–15)
BUN: 111 mg/dL — ABNORMAL HIGH (ref 8–23)
CO2: 27 mmol/L (ref 22–32)
Calcium: 8.2 mg/dL — ABNORMAL LOW (ref 8.9–10.3)
Chloride: 94 mmol/L — ABNORMAL LOW (ref 98–111)
Creatinine, Ser: 5.4 mg/dL — ABNORMAL HIGH (ref 0.61–1.24)
GFR, Estimated: 10 mL/min — ABNORMAL LOW (ref >60)
Glucose, Bld: 84 mg/dL (ref 70–99)
Potassium: 5.9 mmol/L — ABNORMAL HIGH (ref 3.5–5.1)
Sodium: 132 mmol/L — ABNORMAL LOW (ref 135–145)
Total Bilirubin: 1.7 mg/dL — ABNORMAL HIGH (ref 0.3–1.2)
Total Protein: 5.7 g/dL — ABNORMAL LOW (ref 6.5–8.1)

## 2020-10-22 LAB — CBC
HCT: 44.8 % (ref 39.0–52.0)
Hemoglobin: 15.1 g/dL (ref 13.0–17.0)
MCH: 33.7 pg (ref 26.0–34.0)
MCHC: 33.7 g/dL (ref 30.0–36.0)
MCV: 100 fL (ref 80.0–100.0)
Platelets: 97 10*3/uL — ABNORMAL LOW (ref 150–400)
RBC: 4.48 MIL/uL (ref 4.22–5.81)
RDW: 16.9 % — ABNORMAL HIGH (ref 11.5–15.5)
WBC: 9.7 10*3/uL (ref 4.0–10.5)
nRBC: 0 % (ref 0.0–0.2)

## 2020-10-22 LAB — MAGNESIUM: Magnesium: 3.5 mg/dL — ABNORMAL HIGH (ref 1.7–2.4)

## 2020-10-22 LAB — PHOSPHORUS: Phosphorus: 6.3 mg/dL — ABNORMAL HIGH (ref 2.5–4.6)

## 2020-10-22 LAB — BRAIN NATRIURETIC PEPTIDE: B Natriuretic Peptide: 219.9 pg/mL — ABNORMAL HIGH (ref 0.0–100.0)

## 2020-10-22 LAB — CK: Total CK: 80 U/L (ref 49–397)

## 2020-10-22 MED ORDER — INSULIN ASPART 100 UNIT/ML IV SOLN
10.0000 [IU] | Freq: Once | INTRAVENOUS | Status: DC
  Filled 2020-10-22: qty 0.1

## 2020-10-22 MED ORDER — ONDANSETRON HCL 4 MG/2ML IJ SOLN
4.0000 mg | Freq: Four times a day (QID) | INTRAMUSCULAR | Status: DC | PRN
  Administered 2020-10-22: 4 mg via INTRAVENOUS
  Filled 2020-10-22: qty 2

## 2020-10-22 MED ORDER — SODIUM ZIRCONIUM CYCLOSILICATE 10 G PO PACK
10.0000 g | PACK | Freq: Every day | ORAL | Status: DC
  Filled 2020-10-22: qty 1

## 2020-10-22 MED ORDER — DEXAMETHASONE SODIUM PHOSPHATE 4 MG/ML IJ SOLN
4.0000 mg | Freq: Two times a day (BID) | INTRAMUSCULAR | Status: DC
  Administered 2020-10-22 – 2020-10-23 (×3): 4 mg via INTRAVENOUS
  Filled 2020-10-22 (×4): qty 1

## 2020-10-22 MED ORDER — GLYCOPYRROLATE 0.2 MG/ML IJ SOLN
0.4000 mg | Freq: Four times a day (QID) | INTRAMUSCULAR | Status: DC | PRN
  Administered 2020-10-22 – 2020-10-23 (×2): 0.4 mg via INTRAVENOUS
  Filled 2020-10-22 (×4): qty 2

## 2020-10-22 MED ORDER — SODIUM BICARBONATE 8.4 % IV SOLN
50.0000 meq | Freq: Once | INTRAVENOUS | Status: AC
  Administered 2020-10-22: 50 meq via INTRAVENOUS
  Filled 2020-10-22: qty 50

## 2020-10-22 MED ORDER — DEXTROSE 50 % IV SOLN
1.0000 | Freq: Once | INTRAVENOUS | Status: AC
  Administered 2020-10-22: 50 mL via INTRAVENOUS
  Filled 2020-10-22: qty 50

## 2020-10-22 MED ORDER — SODIUM ZIRCONIUM CYCLOSILICATE 10 G PO PACK
10.0000 g | PACK | Freq: Two times a day (BID) | ORAL | Status: DC
  Filled 2020-10-22: qty 1

## 2020-10-22 MED ORDER — HYDROMORPHONE HCL 1 MG/ML IJ SOLN
0.5000 mg | INTRAMUSCULAR | Status: DC | PRN
  Administered 2020-10-22 – 2020-10-23 (×3): 0.5 mg via INTRAVENOUS
  Filled 2020-10-22 (×4): qty 1

## 2020-10-22 MED ORDER — PROCHLORPERAZINE EDISYLATE 10 MG/2ML IJ SOLN
10.0000 mg | Freq: Once | INTRAMUSCULAR | Status: AC
  Administered 2020-10-22: 10 mg via INTRAVENOUS
  Filled 2020-10-22: qty 2

## 2020-10-22 MED ORDER — CALCIUM GLUCONATE-NACL 1-0.675 GM/50ML-% IV SOLN
1.0000 g | Freq: Once | INTRAVENOUS | Status: AC
  Administered 2020-10-22: 1000 mg via INTRAVENOUS
  Filled 2020-10-22: qty 50

## 2020-10-22 NOTE — Progress Notes (Signed)
°   10/22/20 0300  Clinical Encounter Type  Visited With Patient and family together  Visit Type Initial;Spiritual support;Social support  Referral From Nurse  Consult/Referral To Chaplain  Visited with Pt and family per OR. Pt was alert and talking. Pt had a lot of family support. Ch let Pt and family know that they could call on him anytime. Ch will follow up.

## 2020-10-22 NOTE — Progress Notes (Signed)
Nutrition Brief Note  Patient identified to be seen for malnutrition screening tool (MST). Chart reviewed. Patient has transitioned to comfort care.   No nutrition interventions warranted at this time. Please consult as needed.   Jacklynn Barnacle, MS, RD, LDN Pager number available on Amion

## 2020-10-22 NOTE — Consult Note (Signed)
[facility]  Telephone:([phone] [phone]   Name: KATHLEEN LIKINS Date: 10/22/2020 MRN: [id number]  DOB: 05-21-1931  Patient Care Team: [name], MD as PCP - General (Internal Medicine) Rockey Situ Kathlene November, MD as PCP - Cardiology (Cardiology) Minna Merritts, MD as Consulting Physician (Cardiology) Telford Nab, RN as Oncology Nurse Navigator    REASON FOR CONSULTATION: [name] [name] is a 86 y.o. male with multiple medical problems including CAD, ischemic cardiomyopathy with EF of 25 to 30%, and atrial fibrillation on Xarelto.  Patient had a persistent cough over a month, which did not improve with outpatient antibiotics.  Patient was subsequently evaluated by cardiology and underwent CT of the chest revealing a moderately sized right pleural effusion.  Patient underwent thoracentesis on 09/29/2020 yielding 1.3 L of fluid.  Cytology was positive for adenocarcinoma.  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  Patient has progressively declined over the past 2 weeks with poor oral intake and presented to the ED on 10/21/2020 for evaluation.  He was found to have acute renal failure and multiple metabolic derangements.  Patient was referred to palliative care to help address goals and manage ongoing symptoms.  SOCIAL HISTORY:     reports that he quit smoking about 51 years ago. His smoking use included cigarettes. He has a 20.00 pack-year smoking history. He has never used smokeless tobacco. He reports that he does not drink alcohol and does not use drugs.  Patient is married and lives at home with his wife.  They have a daughter in the [place], [place] area.  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  He formerly served in [employer].  ADVANCE DIRECTIVES:  Does not have  CODE STATUS:  DNR  PAST MEDICAL HISTORY: Past Medical History:  Diagnosis Date  . Arthritis   . Asymptomatic Sinus bradycardia   . CAD (coronary artery disease)    a. 10/2005 NSTEMI/PCI: LAD 95 (2.75x33 Cypher DES); b. 2008 Ant STEMI/PCI LAD 2/2 ISR; c. 09/2011 PCI OM2; d. 02/2019 MV: Fixed mid-dist ant/apical, mid-apical inferolateral dfects w/o ischemia; e. 12/2019 Cath: LM nl, LAD 25p/m, patent stent, D2 80, LCX nl, OM2 patent stent, RCA min irregs.  . CKD (chronic kidney disease), stage III ([place])   . HFrEF (heart failure with reduced ejection fraction) ([place])    a. 12/2019 Echo: EF 20-25%, glob HK. Mid-apical septum, inf, apical AK. Mildly reduced RV fxn. Triv MR.  Marland Kitchen Hyperlipidemia   . Hypertension   . Ischemic cardiomyopathy    a. 07/2015 Echo: EF 45-50%; b. 12/2019 Echo: EF 20-25%.  Marland Kitchen LBBB (left bundle branch block)   . Myocardial infarction ([place])   . Persistent atrial fibrillation ([place])    a.  Diagnosed January 2021.  CHA2DS2VASc = 5--> xarelto.  . Tobacco user    Remote    PAST SURGICAL HISTORY:  Past Surgical History:  Procedure Laterality Date  . CARDIAC CATHETERIZATION  10/21/2011   s/p stent placement  . CARDIAC CATHETERIZATION  2008   stent placement  . CARDIAC CATHETERIZATION  2006   stent placement  . CARDIOVERSION N/A 03/05/2020   Procedure: CARDIOVERSION;  Surgeon: Minna Merritts, MD;  Location: [facility];  Service: Cardiovascular;  Laterality: N/A;  . CARPAL TUNNEL RELEASE Right 07/22/2016   Procedure: CARPAL TUNNEL RELEASE;  Surgeon: Leanor Kail, MD;  Location: [facility];  Service: Orthopedics;  Laterality: Right;  . JOINT REPLACEMENT    . LEFT HEART CATH AND CORONARY ANGIOGRAPHY N/A 01/20/2020   Procedure: LEFT HEART CATH AND CORONARY ANGIOGRAPHY;  Surgeon: Jettie Booze, MD;  Location: [facility];  Service: Cardiovascular;  Laterality: N/A;  . TOTAL HIP ARTHROPLASTY     Right  . TOTAL KNEE ARTHROPLASTY Left 06/29/2018   Procedure: LEFT TOTAL KNEE  ARTHROPLASTY;  Surgeon: Frederik Pear, MD;  Location: [facility];  Service: Orthopedics;  Laterality: Left;  . TRANSURETHRAL RESECTION OF PROSTATE      HEMATOLOGY/ONCOLOGY HISTORY:  Oncology History   No history exists.    ALLERGIES:  is allergic to penicillins.  MEDICATIONS:  Current Facility-Administered Medications  Medication Dose Route Frequency Provider Last Rate Last Admin  . acetaminophen (TYLENOL) tablet 650 mg  650 mg Oral Q6H PRN Howerter, Justin B, DO       Or  . acetaminophen (TYLENOL) suppository 650 mg  650 mg Rectal Q6H PRN Howerter, Justin B, DO      . calcium gluconate 1 g/ 50 mL sodium chloride IVPB  1 g Intravenous Once Loletha Grayer, MD 50 mL/hr at 10/22/20 1218 1,000 mg at 10/22/20 1218  . dexamethasone (DECADRON) injection 4 mg  4 mg Intravenous Q12H Ren Aspinall R, NP      . dextrose 50 % solution 50 mL  1 ampule Intravenous Once Wieting, Richard, MD      . HYDROmorphone (DILAUDID) injection 0.5 mg  0.5 mg Intravenous Q2H PRN Tranice Laduke, Kirt Boys, NP      . LORazepam (ATIVAN) injection 0.5 mg  0.5 mg Intravenous Q3H PRN Howerter, Justin B, DO   2 mg at 10/21/20 2218  . melatonin tablet 2.5 mg  2.5 mg Oral QHS PRN Howerter, Justin B, DO      . ondansetron (ZOFRAN) injection 4 mg  4 mg Intravenous Q6H PRN Loletha Grayer, MD   4 mg at 10/22/20 1212  . sodium bicarbonate injection 50 mEq  50 mEq Intravenous Once Loletha Grayer, MD       Current Outpatient Medications  Medication Sig Dispense Refill  . allopurinol (ZYLOPRIM) 100 MG tablet daily.    Marland Kitchen ALPRAZolam (XANAX) 0.25 MG tablet Take 1 tablet (0.25 mg total) by mouth 2 (two) times daily as needed for anxiety. 15 tablet 0  . amiodarone (PACERONE) 200 MG tablet Take 1 tablet (200 mg total) by mouth daily. 90 tablet 0  . benzonatate (TESSALON) 200 MG capsule Take 200 mg by mouth 3 (three) times daily as needed.    . carvedilol (COREG) 6.25 MG tablet Take 1 tablet (6.25 mg total) by mouth 2 (two) times daily with  a meal. 180 tablet 3  . guaiFENesin-codeine (ROBITUSSIN AC) 100-10 MG/5ML syrup Take 5 mLs by mouth 3 (three) times daily as needed for cough. 120 mL 0  . hydrALAZINE (APRESOLINE) 10 MG tablet Take 1 tablet (10 mg total) by mouth 3 (three) times daily. (Patient taking differently: Take 10 mg by mouth daily. ) 270 tablet 1  . isosorbide mononitrate (IMDUR) 30 MG 24 hr tablet Take 0.5 tablets (15 mg total) by mouth daily. 90 tablet 1  . Multiple Vitamin (MULTIVITAMIN WITH MINERALS) TABS tablet Take 2 tablets by mouth daily.    . nitroGLYCERIN (NITROSTAT) 0.4 MG SL tablet Place 0.4 mg under the tongue every 5 (five) minutes as needed for chest pain.    Marland Kitchen ondansetron (ZOFRAN-ODT) 4 MG disintegrating tablet Take 1  tablet (4 mg total) by mouth every 8 (eight) hours as needed for nausea or vomiting. 45 tablet 0  . pantoprazole (PROTONIX) 40 MG tablet Take by mouth.    . Patiromer Sorbitex Calcium (VELTASSA PO) Take 1 Package by mouth once a week. (Patient not taking: Reported on 10/07/2020)    . predniSONE (DELTASONE) 50 MG tablet Take by mouth. Take 1 tablet (50 mg total) by mouth once daily Take 74m x 10d and then 241mx 4d    . Probiotic CAPS Take 1 capsule by mouth daily.    . Rivaroxaban (XARELTO) 15 MG TABS tablet Take 15 mg by mouth daily with supper.    . rosuvastatin (CRESTOR) 10 MG tablet Take 1 tablet (10 mg total) by mouth daily. 90 tablet 1  . sulfamethoxazole-trimethoprim (BACTRIM) 400-80 MG tablet Take by mouth. Take 1 tablet (80 mg of trimethoprim total) by mouth every 12 (twelve) hours for 14 days    . torsemide (DEMADEX) 20 MG tablet Take 1 tablet (20 mg total) by mouth as directed. Take 20 twice a day alternating with 20 once a day. 180 tablet 3  . triamcinolone cream (KENALOG) 0.1 % Apply 1 application topically daily as needed (itching).      VITAL SIGNS: BP (!) 105/59 (BP Location: Right Arm)   Pulse 85   Temp 98.1 F (36.7 C) (Oral)   Resp 19   Ht _0  (1.676 m)   Wt 200 lb  9.9 oz (91 kg)   SpO2 91%   BMI 32.38 kg/m  Filed Weights   10/21/20 1230 10/22/20 0543  Weight: 200 lb 9.9 oz (91 kg) 200 lb 9.9 oz (91 kg)    Estimated body mass index is 32.38 kg/m as calculated from the following:   Height as of this encounter: _1  (1.676 m).   Weight as of this encounter: 200 lb 9.9 oz (91 kg).  LABS: CBC:    Component Value Date/Time   WBC 9.7 10/22/2020 0639   HGB 15.1 10/22/2020 0639   HGB 14.9 02/26/2020 1206   HCT 44.8 10/22/2020 0639   HCT 43.6 02/26/2020 1206   PLT 97 (L) 10/22/2020 0639   PLT 162 02/26/2020 1206   MCV 100.0 10/22/2020 0639   MCV 101 (H) 02/26/2020 1206   NEUTROABS 5.2 10/07/2020 1205   NEUTROABS 4.1 02/26/2020 1206   LYMPHSABS 1.5 10/07/2020 1205   LYMPHSABS 2.2 02/26/2020 1206   MONOABS 0.4 10/07/2020 1205   EOSABS 0.1 10/07/2020 1205   EOSABS 0.3 02/26/2020 1206   BASOSABS 0.0 10/07/2020 1205   BASOSABS 0.0 02/26/2020 1206   Comprehensive Metabolic Panel:    Component Value Date/Time   NA 132 (L) 10/22/2020 0639   NA 136 06/26/2020 0956   K 5.9 (H) 10/22/2020 0639   CL 94 (L) 10/22/2020 0639   CO2 27 10/22/2020 0639   BUN 111 (H) 10/22/2020 0639   BUN 57 (H) 06/26/2020 0956   CREATININE 5.40 (H) 10/22/2020 0639   GLUCOSE 84 10/22/2020 0639   CALCIUM 8.2 (L) 10/22/2020 0639   AST 56 (H) 10/22/2020 0639   ALT 48 (H) 10/22/2020 0639   ALKPHOS 141 (H) 10/22/2020 0639   BILITOT 1.7 (H) 10/22/2020 0639   PROT 5.7 (L) 10/22/2020 0639   ALBUMIN 3.0 (L) 10/22/2020 0639    RADIOGRAPHIC STUDIES: CT CHEST WO CONTRAST  Result Date: 09/26/2020 CLINICAL DATA:  Productive cough. EXAM: CT CHEST WITHOUT CONTRAST TECHNIQUE: Multidetector CT imaging of the chest was performed following the  standard protocol without IV contrast. COMPARISON:  None. FINDINGS: Cardiovascular: Atherosclerosis of thoracic aorta is noted without aneurysm formation. Normal cardiac size. No pericardial effusion is noted. Coronary artery calcifications  are noted. Mediastinum/Nodes: No enlarged mediastinal or axillary lymph nodes. Thyroid gland, trachea, and esophagus demonstrate no significant findings. Lungs/Pleura: No pneumothorax is noted. Emphysematous disease is noted. Left lung is clear. Moderate right pleural effusion is noted with probable adjacent right lower lobe subsegmental atelectasis. Right upper lobe airspace Passy is noted concerning for pneumonia. Upper Abdomen: No acute abnormality. Musculoskeletal: No chest wall mass or suspicious bone lesions identified. IMPRESSION: 1. Moderate right pleural effusion is noted with probable adjacent right lower lobe subsegmental atelectasis. 2. Right upper lobe airspace Passy is noted concerning for pneumonia. 3. Coronary artery calcifications are noted suggesting coronary artery disease. 4. Emphysema and aortic atherosclerosis. Aortic Atherosclerosis (ICD10-I70.0) and Emphysema (ICD10-J43.9). Electronically Signed   By: Marijo Conception M.D.   On: 09/26/2020 14:54   US Renal  Result Date: 10/21/2020 CLINICAL DATA:  Acute renal failure EXAM: RENAL / URINARY TRACT ULTRASOUND COMPLETE COMPARISON:  04/03/2020 FINDINGS: Right Kidney: Renal measurements: 8.4 x 5.5 x 4 cm = volume: 97 mL. There is increased cortical echogenicity without evidence for hydronephrosis. There are few small cortical cysts. Left Kidney: Renal measurements: 9.8 x 5.6 x 4.4 cm = volume: 125 mL. There is no hydronephrosis. There are small cysts. There is increased cortical echogenicity. Bladder: Appears normal for degree of bladder distention. Other: None. IMPRESSION: Echogenic kidneys bilaterally which can be seen in patients with medical renal disease. No hydronephrosis. Electronically Signed   By: Constance Holster M.D.   On: 10/21/2020 18:57   NM PET Image Initial (PI) Skull Base To Thigh  Result Date: 10/21/2020 CLINICAL DATA:  Initial treatment strategy for adenocarcinoma of the RIGHT lung. EXAM: NUCLEAR MEDICINE PET SKULL BASE  TO THIGH TECHNIQUE: 10.97 mCi F-18 FDG was injected intravenously. Full-ring PET imaging was performed from the skull base to thigh after the radiotracer. CT data was obtained and used for attenuation correction and anatomic localization. Fasting blood glucose: 102 mg/dl COMPARISON:  Chest CT of September 26, 2020 FINDINGS: Mediastinal blood pool activity: SUV max 3.0 Liver activity: SUV max NA NECK: Hypermetabolic LEFT supraclavicular lymph node (image 59, series 3) approximately 6 mm with SUV value of approximately 4.5 Small RIGHT level 2 lymph node (image 38, series 3) (SUVmax = 3.2) Another adjacent subcentimeter RIGHT level 2 lymph node with similar FDG uptake. Incidental CT findings: none CHEST: Extensive increased FDG uptake throughout the chest. Diffuse septal thickening in the RIGHT perihilar region involving RIGHT upper lobe and RIGHT middle lobe. Nodule in the RIGHT upper lobe on image 88 of series 3 is intensely FDG avid with an SUV of 8.9 FDG uptake is seen that is above blood pool in other areas of consolidative change in septal thickening throughout the RIGHT chest., for instance on image 101 of series 3 consolidative areas extending anteriorly into the RIGHT upper lobe with an SUV of 5.0 RIGHT hilar adenopathy (image 99, series 3) (SUVmax = 9.0) Subcarinal adenopathy with increased FDG uptake as well (image 99, series 3) 8 mm with a maximum SUV of 7.8 Unenlarged but mildly hypermetabolic lymph nodes elsewhere in the chest. Focal hypermetabolic activity on image 104 of series 3 corresponds to the esophagus without definite visible lesion in this area, perhaps a small lymph node or small esophageal lesion with a maximum SUV of 5.9. There is subtle change in density  at the level of increased metabolic activity of uncertain significance. Scattered nonspecific LEFT axillary lymph nodes with mild increased FDG uptake. Incidental CT findings: Calcified atheromatous plaque in the thoracic aorta. Three-vessel  coronary artery disease. Heart size mildly enlarged without pericardial effusion. Diffuse nodular septal thickening in the RIGHT chest and areas of consolidative change. Moderate RIGHT-sided loculated pleural fluid.  Pulmonary emphysema. Small nodule in the LEFT lung base 6 mm without definitive signs of FDG uptake, certainly not above blood pool. ABDOMEN/PELVIS: Nodular contour of the liver. Focal area of increased FDG uptake near the dome of the RIGHT hemi liver (image 31, series 3) (SUVmax = 8.8) Diffuse increased FDG uptake throughout the nodular appearing LEFT hepatic lobe without clear signs of discrete mass on noncontrast imaging. Similar pattern in the RIGHT hepatic lobe posteriorly mainly hepatic subsegment VII and VI also without discrete focal hepatic lesion aside from a single lesion on image 157 of series 3. Note that for reference what appears to represent normal background hepatic activity is 4.8 with 10.2 maximum SUV in posterior RIGHT hepatic lobe and similar FDG uptake in the LEFT hepatic lobe. LEFT adrenal nodularity with intense FDG uptake, nodularity on the RIGHT with less FDG uptake. LEFT adrenal lesion approximately 13 mm with a maximum SUV of 6.8 no adenopathy in the abdomen with increased metabolic activity. No adenopathy in the pelvis. Incidental CT findings: Cystic renal lesions and lesions of varying complexity of the bilateral kidneys without signs of focal, suspicious FDG uptake. Mild parenchymal thinning. No hydronephrosis. No pericholecystic stranding. Normal spleen. Normal pancreas. Calcified atheromatous plaque in the abdominal aorta. No aneurysmal dilation. Prostate grossly unremarkable though obscured by streak artifact from bilateral hip arthroplasty changes. SKELETON: Diffuse bony metastatic disease. Multifocal lesions in bilateral ribs. Lesions at multiple levels throughout the spine with involvement of T1 through T12 mainly in anterior elements. Posterior elements at T5 and T6  are involved in also at T12. All lumbar levels with varying degrees of involvement with diffuse involvement of L2, L3 and in particular. Diffuse involvement of the body of the sternum with a maximum SUV of 8.1. T10 with maximum SUV of 8.2. L2 with a maximum SUV of 8.9 Diffuse central upper and RIGHT sacral involvement (image 212) on fused dataset. Subtle heterogeneity in this location without discrete lesion. Maximum SUV of approximately 8.2. Scattered lesions throughout the remainder of the pelvis, LEFT iliac along the LEFT iliac wing and RIGHT posterior iliac bone. Many of the above lesions display subtly lytic characteristics or subtly sclerotic characteristics but many are nearly occult from a radiographic perspective on CT images. Chronic appearing L1 compression deformity. Incidental CT findings: Multilevel spinal degenerative changes. IMPRESSION: 1. Findings that are suspicious for pulmonary neoplasm with lymphangitic carcinomatosis, neck and chest adenopathy, diffuse bony metastatic disease and metastatic disease to the adrenals and potentially liver. 2. Areas of diffuse uptake without discrete lesion but with a single lesion that can be visualized on CT images in the RIGHT hepatic lobe. Infiltrative neoplasm is considered. Consider follow-up MRI of the liver for further assessment. Nodularity of the liver also raising the question of background liver disease. 3. Focal area of increased uptake in the esophagus may represent a small lymph node adjacent to the esophagus. There is subtle increase in soft tissue without definite lesion in this location. Dedicated esophageal evaluation may be helpful as warranted. 4. C2 with increased FDG uptake, also some FDG uptake associated with degenerative changes in the middle lower cervical spine maximum SUV at  C2 is 3.9 These results will be called to the ordering clinician or representative by the Radiologist Assistant, and communication documented in the PACS or [employer]. Electronically Signed   By: Zetta Bills M.D.   On: 10/21/2020 15:10   DG Chest Port 1 View  Result Date: 10/15/2020 CLINICAL DATA:  Status post right thoracentesis. EXAM: PORTABLE CHEST 1 VIEW COMPARISON:  09/29/2020 FINDINGS: No pneumothorax after right thoracentesis. No significant residual pleural fluid by x-ray. Persistent opacity in the right perihilar lung is suspicious for malignancy. No edema. IMPRESSION: No pneumothorax after right thoracentesis. No significant residual right pleural fluid by x-ray. Persistent right perihilar lung opacity suspicious for malignancy. Electronically Signed   By: Aletta Edouard M.D.   On: 10/15/2020 10:13   DG Chest Port 1 View  Result Date: 09/29/2020 CLINICAL DATA:  Status post thoracentesis EXAM: PORTABLE CHEST 1 VIEW COMPARISON:  Chest CT September 25, 2020 FINDINGS: No pneumothorax. Essentially complete resolution of pleural effusion after thoracentesis. Airspace consolidation noted in the right mid lung, essentially stable compared to recent study. Left lung clear. Heart is borderline enlarged with pulmonary vascularity normal. No adenopathy. No bone lesions. IMPRESSION: No pneumothorax with resolution of pleural effusion following thoracentesis. Consolidation right mid lung, most likely due to pneumonia. An obstructing bronchial lesion could present in this manner. Serial evaluation to clearing advised. Lungs elsewhere clear. Heart borderline enlarged with pulmonary vascularity normal. No adenopathy. Followup PA and lateral chest radiographs recommended in 3-4 weeks following trial of antibiotic therapy to ensure resolution and exclude underlying malignancy. Electronically Signed   By: Lowella Grip III M.D.   On: 09/29/2020 14:48   DG Abdomen Acute W/Chest  Result Date: 10/21/2020 CLINICAL DATA:  86 year old male with weakness, nausea vomiting. EXAM: DG ABDOMEN ACUTE WITH 1 VIEW CHEST COMPARISON:  Chest radiograph dated 10/15/2020.  FINDINGS: Small right pleural effusion. Right perihilar streaky densities again noted. No new consolidation. There is background of emphysema. No pneumothorax. The cardiac silhouette is within limits. There is moderate stool throughout the colon. No bowel dilatation or evidence of obstruction. No free air or radiopaque calculi. Degenerative changes of the spine. Bilateral hip arthroplasties. No acute osseous pathology. IMPRESSION: 1. Right perihilar density as seen on the earlier radiograph. No pneumothorax. 2. Small right pleural effusion. 3. Emphysema. 4. Constipation.  No bowel obstruction. Electronically Signed   By: Anner Crete M.D.   On: 10/21/2020 19:01   US THORACENTESIS ASP PLEURAL SPACE W/IMG GUIDE  Result Date: 10/15/2020 CLINICAL DATA:  Recurrent right pleural effusion with prior thoracentesis on 09/29/2020. Cytology revealed adenocarcinoma in pleural fluid. EXAM: ULTRASOUND GUIDED RIGHT THORACENTESIS COMPARISON:  None. PROCEDURE: An ultrasound guided thoracentesis was thoroughly discussed with the patient and questions answered. The benefits, risks, alternatives and complications were also discussed. The patient understands and wishes to proceed with the procedure. Written consent was obtained. A time-out was performed prior to initiating the procedure. Ultrasound was performed to localize and mark an adequate pocket of fluid in the right chest. The area was then prepped and draped in the normal sterile fashion. 1% Lidocaine was used for local anesthesia. Under ultrasound guidance a 6 French Safe-T-Centesis catheter was introduced. Thoracentesis was performed. The catheter was removed and a dressing applied. COMPLICATIONS: None FINDINGS: A total of approximately 1.3 L of serosanguineous fluid was removed. A fluid sample was sent for laboratory analysis. IMPRESSION: Successful ultrasound guided right thoracentesis yielding 1.3 L of pleural fluid. Electronically Signed   By: Jenness Corner.D.  On: 10/15/2020 10:10   US THORACENTESIS ASP PLEURAL SPACE W/IMG GUIDE  Result Date: 09/29/2020 INDICATION: Shortness of breath. Large right pleural effusion. Recent history of pneumonia. Request diagnostic and therapeutic thoracentesis. EXAM: ULTRASOUND GUIDED RIGHT THORACENTESIS MEDICATIONS: 1% plain lidocaine, 10 mL COMPLICATIONS: None immediate. Postprocedural chest x-ray negative for pneumothorax PROCEDURE: An ultrasound guided thoracentesis was thoroughly discussed with the patient and questions answered. The benefits, risks, alternatives and complications were also discussed. The patient understands and wishes to proceed with the procedure. Written consent was obtained. Ultrasound was performed to localize and mark an adequate pocket of fluid in the right chest. The area was then prepped and draped in the normal sterile fashion. 1% Lidocaine was used for local anesthesia. Under ultrasound guidance a 6 Fr Safe-T-Centesis catheter was introduced. Thoracentesis was performed. The catheter was removed and a dressing applied. FINDINGS: A total of approximately 1.3 L of clear, dark yellow fluid was removed. Samples were sent to the laboratory as requested by the clinical team. IMPRESSION: Successful ultrasound guided right thoracentesis yielding 1.3 L of pleural fluid. Read by: Ascencion Dike PA-C Electronically Signed   By: Marcello Moores  Register   On: 09/29/2020 14:53    PERFORMANCE STATUS (ECOG) : 4 - Bedbound  Review of Systems Unless otherwise noted, a complete review of systems is negative.  Physical Exam General: Ill-appearing Cardiovascular: regular rate and rhythm Pulmonary: Unlabored, wet sounding cough, on O2 Abdomen: soft, nontender, + bowel sounds GU: no suprapubic tenderness Extremities: no edema, no joint deformities Skin: no rashes Neurological: Weakness, periods of lethargy, oriented when awake  IMPRESSION: Patient is ill-appearing in the ER.  He has worsening serum creatinine and  hyperkalemia despite IV fluids overnight.    Dr. Tasia Catchings and I met with patient, wife, and grandson.  Together, we reviewed the results of the PET scan.  Patient verbalized understanding that he is not felt to be a candidate for any cancer treatment given his declining performance status and multiple comorbidities.  In light of the aggressive appearing nature of his cancer, it is felt that patient is likely at end-of-life.  We discussed the option of comfort care and transfer to the [facility] and both patient and family were in agreement with this plan.  Given renal dysfunction, would recommend hydromorphone instead of morphine for pain/dyspnea.  We will start low-dose steroids to help with nausea.  Emotional support provided to family.  Chaplain consult requested.  I spoke with the hospice liaison.  Unfortunately, there is not currently a bed available at the [facility].  Patient will be added to the wait list.  PLAN: -Comfort care -Liberalize comfort meds -TOC/hospice liaison to coordinate discharge -Hospice Home when a bed is available -Chaplain consult -DNR/DNI    Time Total: 60 minutes    Visit consisted of counseling and education dealing with the complex and emotionally intense issues of symptom management and palliative care in the setting of serious and potentially life-threatening illness.Greater than 50%  of this time was spent counseling and coordinating care related to the above assessment and plan.  Signed by: Altha Harm, PhD, NP-C

## 2020-10-22 NOTE — TOC Initial Note (Signed)
Transition of Care [facility]) - Initial/Assessment Note    Patient Details  Name: [name] [name] MRN: [id number] Date of Birth: October 21, 1931  [facility]) CM/SW Contact:    Ova Freshwater Phone Number: [phone] 10/22/2020, 4:51 PM  Clinical Narrative:                  Patient presents to [facility] due to increasing weakness over the last two days.  Patient is followed by [facility].  CSW spoke with patient's Torrion, Witter (Spouse) [phone] and explained role of TOC in patient care.  Ms. Stuhr stated patient needs assistance with all ADLs and has been extremely weak the last couple of days.  CSW spoke with Apolinar Junes.  Patient has hospice home bed offer, pending bed availability.   Expected Discharge Plan: [place] Barriers to Discharge: Continued Medical Work up   Patient Goals and CMS Choice Patient states their goals for this hospitalization and ongoing recovery are:: Patient has bed offer at [facility] home, pending transfer with available bed.      Expected Discharge Plan and Services Expected Discharge Plan: [place] In-house Referral: Clinical Social Work   Post Acute Care Choice: Hospice (Followed by [facility]) Living arrangements for the past 2 months: Single Family Home                                      Prior Living Arrangements/Services Living arrangements for the past 2 months: Single Family Home Lives with:: Spouse Patient language and need for interpreter reviewed:: Yes Do you feel safe going back to the place where you live?: Yes      Need for Family Participation in Patient Care: Yes (Comment)   Current home services: Hospice Criminal Activity/Legal Involvement Pertinent to Current Situation/Hospitalization: No - Comment as needed  Activities of Daily Living      Permission Sought/Granted Permission sought to share information with : Family Supports, Other  (comment)    Share Information with NAME: Reyansh, Kushnir (Spouse) [phone]  Permission granted to share info w AGENCY: [facility]        Emotional Assessment Appearance:: Appears stated age Attitude/Demeanor/Rapport: Unable to Assess Affect (typically observed): Unable to Assess   Alcohol / Substance Use: Not Applicable Psych Involvement: No (comment)  Admission diagnosis:  AKI (acute kidney injury) ([place]) [N17.9] Acute kidney injury superimposed on CKD ([place]) [N17.9, N18.9] Patient Active Problem List   Diagnosis Date Noted  . Acute kidney injury superimposed on CKD ([place]) 10/22/2020  . End of life care   . AF (paroxysmal atrial fibrillation) ([place])   . AKI (acute kidney injury) ([place]) 10/21/2020  . Nausea & vomiting 10/21/2020  . Generalized weakness 10/21/2020  . Hyponatremia 10/21/2020  . Primary malignant neoplasm of lung metastatic to other site ([place]) 10/07/2020  . Elevated troponin   . Chest pain 01/19/2020  . Hyperkalemia 01/19/2020  . Renal failure (ARF), acute on chronic ([place]) 01/19/2020  . Total knee replacement status, left 06/30/2018  . Primary osteoarthritis of left knee 06/29/2018  . Degenerative arthritis of left knee 06/27/2018  . GERD (gastroesophageal reflux disease) 09/21/2015  . Chronic renal failure, stage 3 (moderate) ([place]) 09/21/2015  . Pain in the chest   . Shortness of breath 03/25/2015  . Hyperlipidemia 10/07/2009  . HYPERTENSION, BENIGN 10/07/2009  . NSTEMI (non-ST elevated myocardial infarction) ([place]) 10/07/2009  . CAD, NATIVE VESSEL 10/07/2009  PCP:  [name], MD Pharmacy:   [place], [address] [place] [address] Phone: [phone] Fax: [phone]     Social Determinants of Health (SDOH) Interventions    Readmission Risk Interventions No flowsheet data found.

## 2020-10-22 NOTE — Progress Notes (Signed)
[facility] Liaison Note:  New referral for [facility] received from Palliative NP [place]. Patient information given to referral. Hospice home eligibility has been confirmed.  Writer spoke with patient's grandson [name] [name] and later with patient's wife [name] [name] at bedside. Emotional support provided. Patient will be moved out of the ED to room 132. Hospital care team and family are aware that [facility] has no bed availability today. Hospice information and contact numbers given to [facility]. Liaison will follow daily and update family and TOC regarding bed availability. Thank you the opportunity to be involved in the care of this patient and his family.  Flo Shanks BSN, RN, [place] [phone]

## 2020-10-22 NOTE — ED Notes (Signed)
Report called to floor nurse jennifer rn.

## 2020-10-22 NOTE — Progress Notes (Signed)
Patient ID: [name] [name], male   DOB: 1931/10/19, 86 y.o.   MRN: [id number] [facility] PROGRESS NOTE  [name] [name] WEX:[id number] DOB: [birth year] DOA: 10/21/2020 PCP: [name], MD  HPI/Subjective: Patient seen today.  Came in initially with shortness of breath.  Patient took a few ice chips and was coughing most of the time I was in the room.  Palliative care and oncology just came from seeing the patient and they had decided on comfort care measures.  Objective: Vitals:   10/22/20 0806 10/22/20 1220  BP: 110/67 (!) 105/59  Pulse: 65 85  Resp: (!) 23 19  Temp: 98.1 F (36.7 C)   SpO2: 94% 91%   No intake or output data in the 24 hours ending 10/22/20 1320 Filed Weights   10/21/20 1230 10/22/20 0543  Weight: 91 kg 91 kg    ROS: Review of Systems  Respiratory: Positive for cough and shortness of breath.   Cardiovascular: Negative for chest pain.  Gastrointestinal: Negative for abdominal pain.   Exam: Physical Exam HENT:     Head: Normocephalic.     Mouth/Throat:     Pharynx: No oropharyngeal exudate.  Eyes:     General: Lids are normal.     Conjunctiva/sclera: Conjunctivae normal.     Pupils: Pupils are equal, round, and reactive to light.  Cardiovascular:     Rate and Rhythm: Normal rate and regular rhythm.     Heart sounds: Normal heart sounds, S1 normal and S2 normal.  Pulmonary:     Breath sounds: Examination of the right-lower field reveals decreased breath sounds and rhonchi. Examination of the left-lower field reveals decreased breath sounds and rhonchi. Decreased breath sounds and rhonchi present. No wheezing.  Abdominal:     Palpations: Abdomen is soft.     Tenderness: There is no abdominal tenderness.  Musculoskeletal:     Right lower leg: Swelling present.     Left lower leg: Swelling present.  Skin:    General: Skin is warm.     Findings: No rash.  Neurological:     Mental Status: He is alert.     Comments: Answered a few yes or no  questions       Data Reviewed: Basic Metabolic Panel: Recent Labs  Lab 10/21/20 1236 10/22/20 0639  NA 131* 132*  K 5.6* 5.9*  CL 95* 94*  CO2 23 27  GLUCOSE 130* 84  BUN 103* 111*  CREATININE 5.09* 5.40*  CALCIUM 8.7* 8.2*  MG  --  3.5*  PHOS  --  6.3*   Liver Function Tests: Recent Labs  Lab 10/22/20 0639  AST 56*  ALT 48*  ALKPHOS 141*  BILITOT 1.7*  PROT 5.7*  ALBUMIN 3.0*   CBC: Recent Labs  Lab 10/21/20 1236 10/22/20 0639  WBC 9.6 9.7  HGB 16.3 15.1  HCT 49.0 44.8  MCV 99.8 100.0  PLT 110* 97*   Cardiac Enzymes: Recent Labs  Lab 10/22/20 0639  CKTOTAL 80   BNP (last 3 results) Recent Labs    06/26/20 0956 10/22/20 0639  BNP 88.0 219.9*     CBG: Recent Labs  Lab 10/21/20 0954  GLUCAP 102*    Recent Results (from the past 240 hour(s))  SARS CORONAVIRUS 2 (TAT 6-24 HRS) Nasopharyngeal Nasopharyngeal Swab     Status: None   Collection Time: 10/14/20 12:28 PM   Specimen: Nasopharyngeal Swab  Result Value Ref Range Status   SARS Coronavirus 2 NEGATIVE NEGATIVE Final  Comment: (NOTE) SARS-CoV-2 target nucleic acids are NOT DETECTED.  The SARS-CoV-2 RNA is generally detectable in upper and lower respiratory specimens during the acute phase of infection. Negative results do not preclude SARS-CoV-2 infection, do not rule out co-infections with other pathogens, and should not be used as the sole basis for treatment or other patient management decisions. Negative results must be combined with clinical observations, patient history, and epidemiological information. The expected result is Negative.  Fact Sheet for Patients: SugarRoll.be  Fact Sheet for Healthcare Providers: https://www.woods-mathews.com/  This test is not yet approved or cleared by the [place] FDA and  has been authorized for detection and/or diagnosis of SARS-CoV-2 by FDA under an Emergency Use Authorization (EUA).  This EUA will remain  in effect (meaning this test can be used) for the duration of the COVID-19 declaration under Se ction 564(b)(1) of the Act, 21 U.S.C. section 360bbb-3(b)(1), unless the authorization is terminated or revoked sooner.  Performed at [facility], [place] [address] [place], [place] [address]   Body fluid culture     Status: None   Collection Time: 10/15/20  9:45 AM   Specimen: PATH Cytology Pleural fluid  Result Value Ref Range Status   Specimen Description   Final    PLEURAL Performed at [facility], [address] [place], [place] [address]    Special Requests   Final    NONE Performed at [facility], [address] [place], [place] [address]    Gram Stain   Final    RARE WBC PRESENT, PREDOMINANTLY MONONUCLEAR NO ORGANISMS SEEN    Culture   Final    NO GROWTH 3 DAYS Performed at [facility], [place] [address] [place], [place] [address]    Report Status 10/18/2020 FINAL  Final  Fungus Culture With Stain     Status: None (Preliminary result)   Collection Time: 10/15/20  9:45 AM   Specimen: PATH Cytology Pleural fluid  Result Value Ref Range Status   Fungus Stain Final report  Final    Comment: (NOTE) Performed At: [facility] [place], [place] [id number] Rush Farmer MD [id number]    Fungus (Mycology) Culture PENDING  Incomplete   Fungal Source PLEURAL  Final    Comment: Performed at [facility], [address] [place], [place] [address]  Acid Fast Smear (AFB)     Status: None   Collection Time: 10/15/20  9:45 AM   Specimen: PATH Cytology Pleural fluid  Result Value Ref Range Status   AFB Specimen Processing Concentration  Final   Acid Fast Smear Negative  Final    Comment: (NOTE) Performed At: [facility] [address] [place], [place] [id number] Rush Farmer MD [id number]    Source (AFB) PLEURAL  Final    Comment: Performed at [facility], [address] [place], [place] [address]  Fungus Culture Result     Status: None   Collection Time: 10/15/20  9:45 AM  Result Value Ref Range Status   Result 1 Comment  Final    Comment: (NOTE) KOH/Calcofluor preparation:  no fungus observed. Performed At: [facility] [place], [place] [id number] Rush Farmer MD [id number]   Respiratory Panel by RT PCR (Flu A&B, Covid) - Nasopharyngeal Swab     Status: None   Collection Time: 10/21/20 10:23 PM   Specimen: Nasopharyngeal Swab  Result Value Ref Range Status   SARS Coronavirus 2 by RT PCR  NEGATIVE NEGATIVE Final    Comment: (NOTE) SARS-CoV-2 target nucleic acids are NOT DETECTED.  The SARS-CoV-2 RNA is generally detectable in upper respiratoy specimens during the acute phase of infection. The lowest concentration of SARS-CoV-2 viral copies this assay can detect is 131 copies/mL. A negative result does not preclude SARS-Cov-2 infection and should not be used as the sole basis for treatment or other patient management decisions. A negative result may occur with  improper specimen collection/handling, submission of specimen other than nasopharyngeal swab, presence of viral mutation(s) within the areas targeted by this assay, and inadequate number of viral copies (<131 copies/mL). A negative result must be combined with clinical observations, patient history, and epidemiological information. The expected result is Negative.  Fact Sheet for Patients:  PinkCheek.be  Fact Sheet for Healthcare Providers:  GravelBags.it  This test is no t yet approved or cleared by the [place] FDA and  has been authorized for detection and/or diagnosis of SARS-CoV-2 by FDA under an Emergency Use Authorization (EUA). This EUA will remain  in effect (meaning this test can be used) for the duration of the COVID-19 declaration under Section 564(b)(1) of the Act, 21  U.S.C. section 360bbb-3(b)(1), unless the authorization is terminated or revoked sooner.     Influenza A by PCR NEGATIVE NEGATIVE Final   Influenza B by PCR NEGATIVE NEGATIVE Final    Comment: (NOTE) The Xpert Xpress SARS-CoV-2/FLU/RSV assay is intended as an aid in  the diagnosis of influenza from Nasopharyngeal swab specimens and  should not be used as a sole basis for treatment. Nasal washings and  aspirates are unacceptable for Xpert Xpress SARS-CoV-2/FLU/RSV  testing.  Fact Sheet for Patients: PinkCheek.be  Fact Sheet for Healthcare Providers: GravelBags.it  This test is not yet approved or cleared by the [place] FDA and  has been authorized for detection and/or diagnosis of SARS-CoV-2 by  FDA under an Emergency Use Authorization (EUA). This EUA will remain  in effect (meaning this test can be used) for the duration of the  Covid-19 declaration under Section 564(b)(1) of the Act, 21  U.S.C. section 360bbb-3(b)(1), unless the authorization is  terminated or revoked. Performed at [facility], [address] [place], [place] [address]      Studies: US Renal  Result Date: 10/21/2020 CLINICAL DATA:  Acute renal failure EXAM: RENAL / URINARY TRACT ULTRASOUND COMPLETE COMPARISON:  04/03/2020 FINDINGS: Right Kidney: Renal measurements: 8.4 x 5.5 x 4 cm = volume: 97 mL. There is increased cortical echogenicity without evidence for hydronephrosis. There are few small cortical cysts. Left Kidney: Renal measurements: 9.8 x 5.6 x 4.4 cm = volume: 125 mL. There is no hydronephrosis. There are small cysts. There is increased cortical echogenicity. Bladder: Appears normal for degree of bladder distention. Other: None. IMPRESSION: Echogenic kidneys bilaterally which can be seen in patients with medical renal disease. No hydronephrosis. Electronically Signed   By: Constance Holster M.D.   On: 10/21/2020 18:57   NM  PET Image Initial (PI) Skull Base To Thigh  Result Date: 10/21/2020 CLINICAL DATA:  Initial treatment strategy for adenocarcinoma of the RIGHT lung. EXAM: NUCLEAR MEDICINE PET SKULL BASE TO THIGH TECHNIQUE: 10.97 mCi F-18 FDG was injected intravenously. Full-ring PET imaging was performed from the skull base to thigh after the radiotracer. CT data was obtained and used for attenuation correction and anatomic localization. Fasting blood glucose: 102 mg/dl COMPARISON:  Chest CT of September 26, 2020 FINDINGS: Mediastinal blood pool activity: SUV max 3.0 Liver activity:  SUV max NA NECK: Hypermetabolic LEFT supraclavicular lymph node (image 59, series 3) approximately 6 mm with SUV value of approximately 4.5 Small RIGHT level 2 lymph node (image 38, series 3) (SUVmax = 3.2) Another adjacent subcentimeter RIGHT level 2 lymph node with similar FDG uptake. Incidental CT findings: none CHEST: Extensive increased FDG uptake throughout the chest. Diffuse septal thickening in the RIGHT perihilar region involving RIGHT upper lobe and RIGHT middle lobe. Nodule in the RIGHT upper lobe on image 88 of series 3 is intensely FDG avid with an SUV of 8.9 FDG uptake is seen that is above blood pool in other areas of consolidative change in septal thickening throughout the RIGHT chest., for instance on image 101 of series 3 consolidative areas extending anteriorly into the RIGHT upper lobe with an SUV of 5.0 RIGHT hilar adenopathy (image 99, series 3) (SUVmax = 9.0) Subcarinal adenopathy with increased FDG uptake as well (image 99, series 3) 8 mm with a maximum SUV of 7.8 Unenlarged but mildly hypermetabolic lymph nodes elsewhere in the chest. Focal hypermetabolic activity on image 104 of series 3 corresponds to the esophagus without definite visible lesion in this area, perhaps a small lymph node or small esophageal lesion with a maximum SUV of 5.9. There is subtle change in density at the level of increased metabolic activity of  uncertain significance. Scattered nonspecific LEFT axillary lymph nodes with mild increased FDG uptake. Incidental CT findings: Calcified atheromatous plaque in the thoracic aorta. Three-vessel coronary artery disease. Heart size mildly enlarged without pericardial effusion. Diffuse nodular septal thickening in the RIGHT chest and areas of consolidative change. Moderate RIGHT-sided loculated pleural fluid.  Pulmonary emphysema. Small nodule in the LEFT lung base 6 mm without definitive signs of FDG uptake, certainly not above blood pool. ABDOMEN/PELVIS: Nodular contour of the liver. Focal area of increased FDG uptake near the dome of the RIGHT hemi liver (image 31, series 3) (SUVmax = 8.8) Diffuse increased FDG uptake throughout the nodular appearing LEFT hepatic lobe without clear signs of discrete mass on noncontrast imaging. Similar pattern in the RIGHT hepatic lobe posteriorly mainly hepatic subsegment VII and VI also without discrete focal hepatic lesion aside from a single lesion on image 157 of series 3. Note that for reference what appears to represent normal background hepatic activity is 4.8 with 10.2 maximum SUV in posterior RIGHT hepatic lobe and similar FDG uptake in the LEFT hepatic lobe. LEFT adrenal nodularity with intense FDG uptake, nodularity on the RIGHT with less FDG uptake. LEFT adrenal lesion approximately 13 mm with a maximum SUV of 6.8 no adenopathy in the abdomen with increased metabolic activity. No adenopathy in the pelvis. Incidental CT findings: Cystic renal lesions and lesions of varying complexity of the bilateral kidneys without signs of focal, suspicious FDG uptake. Mild parenchymal thinning. No hydronephrosis. No pericholecystic stranding. Normal spleen. Normal pancreas. Calcified atheromatous plaque in the abdominal aorta. No aneurysmal dilation. Prostate grossly unremarkable though obscured by streak artifact from bilateral hip arthroplasty changes. SKELETON: Diffuse bony  metastatic disease. Multifocal lesions in bilateral ribs. Lesions at multiple levels throughout the spine with involvement of T1 through T12 mainly in anterior elements. Posterior elements at T5 and T6 are involved in also at T12. All lumbar levels with varying degrees of involvement with diffuse involvement of L2, L3 and in particular. Diffuse involvement of the body of the sternum with a maximum SUV of 8.1. T10 with maximum SUV of 8.2. L2 with a maximum SUV of 8.9 Diffuse central upper  and RIGHT sacral involvement (image 212) on fused dataset. Subtle heterogeneity in this location without discrete lesion. Maximum SUV of approximately 8.2. Scattered lesions throughout the remainder of the pelvis, LEFT iliac along the LEFT iliac wing and RIGHT posterior iliac bone. Many of the above lesions display subtly lytic characteristics or subtly sclerotic characteristics but many are nearly occult from a radiographic perspective on CT images. Chronic appearing L1 compression deformity. Incidental CT findings: Multilevel spinal degenerative changes. IMPRESSION: 1. Findings that are suspicious for pulmonary neoplasm with lymphangitic carcinomatosis, neck and chest adenopathy, diffuse bony metastatic disease and metastatic disease to the adrenals and potentially liver. 2. Areas of diffuse uptake without discrete lesion but with a single lesion that can be visualized on CT images in the RIGHT hepatic lobe. Infiltrative neoplasm is considered. Consider follow-up MRI of the liver for further assessment. Nodularity of the liver also raising the question of background liver disease. 3. Focal area of increased uptake in the esophagus may represent a small lymph node adjacent to the esophagus. There is subtle increase in soft tissue without definite lesion in this location. Dedicated esophageal evaluation may be helpful as warranted. 4. C2 with increased FDG uptake, also some FDG uptake associated with degenerative changes in the  middle lower cervical spine maximum SUV at C2 is 3.9 These results will be called to the ordering clinician or representative by the Radiologist Assistant, and communication documented in the PACS or [employer]. Electronically Signed   By: Zetta Bills M.D.   On: 10/21/2020 15:10   DG Abdomen Acute W/Chest  Result Date: 10/21/2020 CLINICAL DATA:  86 year old male with weakness, nausea vomiting. EXAM: DG ABDOMEN ACUTE WITH 1 VIEW CHEST COMPARISON:  Chest radiograph dated 10/15/2020. FINDINGS: Small right pleural effusion. Right perihilar streaky densities again noted. No new consolidation. There is background of emphysema. No pneumothorax. The cardiac silhouette is within limits. There is moderate stool throughout the colon. No bowel dilatation or evidence of obstruction. No free air or radiopaque calculi. Degenerative changes of the spine. Bilateral hip arthroplasties. No acute osseous pathology. IMPRESSION: 1. Right perihilar density as seen on the earlier radiograph. No pneumothorax. 2. Small right pleural effusion. 3. Emphysema. 4. Constipation.  No bowel obstruction. Electronically Signed   By: Anner Crete M.D.   On: 10/21/2020 19:01    Scheduled Meds: . dexamethasone  4 mg Intravenous Q12H  . dextrose  1 ampule Intravenous Once  . sodium bicarbonate  50 mEq Intravenous Once   Continuous Infusions:  Assessment/Plan:  1. End-of-life care.  Case discussed with Dr. Tasia Catchings oncology and Altha Harm from palliative care.  Comfort care measures will start today.  Patient is a DO NOT RESUSCITATE.  As needed Dilaudid, as needed glycopyrrolate and Ativan. 2. Acute kidney injury on chronic kidney disease stage IV.  Creatinine worsening despite IV fluids.  Patient made comfort care measures. 3. Severe hyperkalemia.  Will give calcium, sodium bicarb, insulin and D50 but will not recheck potassium again.  High risk for arrhythmia. 4. Metastatic lung cancer 5. Paroxysmal atrial  fibrillation 6. Hyponatremia 7. Chronic systolic congestive heart failure 8.   Dysphagia     Code Status:     Code Status Orders  (From admission, onward)         Start     Ordered   10/21/20 2151  Do not attempt resuscitation (DNR)  Continuous       Question Answer Comment  In the event of cardiac or respiratory ARREST Do not call  a "code blue"   In the event of cardiac or respiratory ARREST Do not perform Intubation, CPR, defibrillation or ACLS   In the event of cardiac or respiratory ARREST Use medication by any route, position, wound care, and other measures to relive pain and suffering. May use oxygen, suction and manual treatment of airway obstruction as needed for comfort.      10/21/20 2152        Code Status History    Date Active Date Inactive Code Status Order ID Comments User Context   10/21/2020 2141 10/21/2020 2152 DNR [id number]  Rhetta Mura, DO ED   01/19/2020 1941 01/21/2020 1551 Full Code [id number]  Jani Gravel, MD ED   06/29/2018 1201 06/30/2018 2019 Full Code [id number]  Neldon Newport Inpatient   08/03/2015 1632 08/04/2015 2233 Full Code [id number]  Katheren Shams, DO Inpatient   Advance Care Planning Activity     Family Communication: Wife and grandson at the bedside Disposition Plan: Status is: Inpatient  Dispo: The patient is from: Home              Anticipated d/c is to: Comfort care here in the hospital              Anticipated d/c date is: Comfort care here in the hospital              Patient currently starting comfort care measures here in the hospital  Time spent: 28 minutes, case discussed with oncology and palliative care.  [place]  [facility] [employer]

## 2020-10-22 NOTE — Progress Notes (Signed)
  Oncology Nurse Navigator Documentation  Navigator Location: [facility] (10/22/20 1500)   )Navigator Encounter Type: Appt/Treatment Plan Review (10/22/20 1500)   Abnormal Finding Date: 09/26/20 (10/22/20 1500) Confirmed Diagnosis Date: 10/02/20 (10/22/20 1500)               Patient Visit Type: Inpatient (10/22/20 1500)   Barriers/Navigation Needs: No Barriers At This Time;No Needs (10/22/20 1500)   Interventions: None Required (10/22/20 1500)             Appt/treatment plan review. No barriers or navigation needs at this time. Pt wishes to transition to comfort care.          Time Spent with Patient: 30 (10/22/20 1500)

## 2020-10-22 NOTE — ED Notes (Signed)
Pt in bed, c/o needing to urinate, bed noted to be wet with urine, pants and socks wet as well. This rn removed pts clothing, cleaned him off, provided urinal and changed sheets. Placed in clean hospital gown. Warm blankets given. Pt was appreciative. Alert and oriented at this time, answering questions appropriately. Wet cough noted. Pt states this has been happening after he drinks anything, cups with water removed from bedside, will alert md.

## 2020-10-22 NOTE — Consult Note (Signed)
Hematology/Oncology progress note [facility] Telephone:([phone]) [phone] [phone]  Patient Care Team: [name], MD as PCP - General (Internal Medicine) Rockey Situ Kathlene November, MD as PCP - Cardiology (Cardiology) Minna Merritts, MD as Consulting Physician (Cardiology) Telford Nab, RN as Oncology Nurse Navigator   Name of the patient: [name] [name]  [id number]  24-Aug-1931   Date of visit: 10/22/20 REASON FOR COSULTATION:  Metastatic lung cancer History of presenting illness-  86 y.o. male with multiple medical problems including ischemia cardiomyopathy with LVEF 25 to 30%, atrial fibrillation on anticoagulation, stage IV CKD, new diagnosis of metastatic lung cancer who presents to ER for evaluation of profound weakness and fatigue and poor oral intake, nausea.  And vomiting 09/29/2020, patient had right thoracentesis, cytology was positive for malignancy, adenocarcinoma compatible with lung origin. 10/15/2020, repeat right thoracentesis confirmed positive malignancy. 10/21/2020, PET scan showed pulmonary neoplasm with lymphatic carcinomatosis, neck and chest adenopathy, diffuse bony metastasis and adrenal metastasis and possible liver metastasis.  Diffuse uptake of liver, concerning for infiltrative neoplasm.  Nodularity of the liver also raises question of background liver disease.  Focal area of increased uptake in the esophagus. MRI brain is scheduled but not done due to admission.   In ER his Creatinine is 5.09, potassium 5.6, BNP 219.9, alkaline phosphatase one forty-one, AST fifty-six, ALT forty-eight, protein 5.7, bilirubin 1.7 EKG showed sinus tachycardia second-degree AV block Mobitz type I,  Hematology oncology was consulted for further evaluation and discussion of management plan. Communicated with patient's wife yesterday via the phone about the PET scan results.  We discussed about the poor prognosis.  Patient's grandson is at the bedside  today.  Patient was seen and evaluated at the bedside.  He seems to have further deteriorated from his condition during her last visit with me 2 weeks ago.  He appears very drowsy and not able to stay alert during our conversation.  Reports nauseated.     Review of Systems  Unable to perform ROS: Mental status change    Allergies  Allergen Reactions  . Penicillins Rash    Did it involve swelling of the face/tongue/throat, SOB, or low BP? No Did it involve sudden or severe rash/hives, skin peeling, or any reaction on the inside of your mouth or nose? No Did you need to seek medical attention at a hospital or doctor's office? No When did it last happen?2-3 years If all above answers are "NO", may proceed with cephalosporin use.     Patient Active Problem List   Diagnosis Date Noted  . Acute kidney injury superimposed on CKD ([place]) 10/22/2020  . Palliative care encounter   . AKI (acute kidney injury) ([place]) 10/21/2020  . Nausea & vomiting 10/21/2020  . Generalized weakness 10/21/2020  . Hyponatremia 10/21/2020  . Primary adenocarcinoma of right lung ([place]) 10/07/2020  . Elevated troponin   . Chest pain 01/19/2020  . Hyperkalemia 01/19/2020  . Renal failure (ARF), acute on chronic ([place]) 01/19/2020  . Total knee replacement status, left 06/30/2018  . Primary osteoarthritis of left knee 06/29/2018  . Degenerative arthritis of left knee 06/27/2018  . GERD (gastroesophageal reflux disease) 09/21/2015  . Chronic renal failure, stage 3 (moderate) ([place]) 09/21/2015  . Pain in the chest   . Shortness of breath 03/25/2015  . Hyperlipidemia 10/07/2009  . HYPERTENSION, BENIGN 10/07/2009  . NSTEMI (non-ST elevated myocardial infarction) ([place]) 10/07/2009  . CAD, NATIVE VESSEL 10/07/2009     Past Medical History:  Diagnosis Date  .  Arthritis   . Asymptomatic Sinus bradycardia   . CAD (coronary artery disease)    a. 10/2005 NSTEMI/PCI: LAD 95 (2.75x33 Cypher DES); b. 2008 Ant  STEMI/PCI LAD 2/2 ISR; c. 09/2011 PCI OM2; d. 02/2019 MV: Fixed mid-dist ant/apical, mid-apical inferolateral dfects w/o ischemia; e. 12/2019 Cath: LM nl, LAD 25p/m, patent stent, D2 80, LCX nl, OM2 patent stent, RCA min irregs.  . CKD (chronic kidney disease), stage III ([place])   . HFrEF (heart failure with reduced ejection fraction) ([place])    a. 12/2019 Echo: EF 20-25%, glob HK. Mid-apical septum, inf, apical AK. Mildly reduced RV fxn. Triv MR.  Marland Kitchen Hyperlipidemia   . Hypertension   . Ischemic cardiomyopathy    a. 07/2015 Echo: EF 45-50%; b. 12/2019 Echo: EF 20-25%.  Marland Kitchen LBBB (left bundle branch block)   . Myocardial infarction ([place])   . Persistent atrial fibrillation ([place])    a.  Diagnosed January 2021.  CHA2DS2VASc = 5--> xarelto.  . Tobacco user    Remote     Past Surgical History:  Procedure Laterality Date  . CARDIAC CATHETERIZATION  10/21/2011   s/p stent placement  . CARDIAC CATHETERIZATION  2008   stent placement  . CARDIAC CATHETERIZATION  2006   stent placement  . CARDIOVERSION N/A 03/05/2020   Procedure: CARDIOVERSION;  Surgeon: Minna Merritts, MD;  Location: [facility] ORS;  Service: Cardiovascular;  Laterality: N/A;  . CARPAL TUNNEL RELEASE Right 07/22/2016   Procedure: CARPAL TUNNEL RELEASE;  Surgeon: Leanor Kail, MD;  Location: [facility] ORS;  Service: Orthopedics;  Laterality: Right;  . JOINT REPLACEMENT    . LEFT HEART CATH AND CORONARY ANGIOGRAPHY N/A 01/20/2020   Procedure: LEFT HEART CATH AND CORONARY ANGIOGRAPHY;  Surgeon: Jettie Booze, MD;  Location: [place] [facility];  Service: Cardiovascular;  Laterality: N/A;  . TOTAL HIP ARTHROPLASTY     Right  . TOTAL KNEE ARTHROPLASTY Left 06/29/2018   Procedure: LEFT TOTAL KNEE ARTHROPLASTY;  Surgeon: Frederik Pear, MD;  Location: [place];  Service: Orthopedics;  Laterality: Left;  . TRANSURETHRAL RESECTION OF PROSTATE      Social History   Socioeconomic History  . Marital status: Married    Spouse name: Not on file  .  Number of children: Not on file  . Years of education: Not on file  . Highest education level: Not on file  Occupational History  . Occupation: Retired    Fish farm manager: [employer]  Tobacco Use  . Smoking status: Former Smoker    Packs/day: 1.00    Years: 20.00    Pack years: 20.00    Types: Cigarettes    Quit date: 12/06/1968    Years since quitting: 51.9  . Smokeless tobacco: Never Used  Vaping Use  . Vaping Use: Never used  Substance and Sexual Activity  . Alcohol use: No  . Drug use: No  . Sexual activity: Not on file  Other Topics Concern  . Not on file  Social History Narrative   Married   Lives in [place] with his wife   Social Determinants of Health   Financial Resource Strain:   . Difficulty of Paying Living Expenses: Not on file  Food Insecurity:   . Worried About Charity fundraiser in the Last Year: Not on file  . Ran Out of Food in the Last Year: Not on file  Transportation Needs:   . Lack of Transportation (Medical): Not on file  . Lack of Transportation (Non-Medical): Not on file  Physical Activity:   . Days of Exercise per Week: Not on file  . Minutes of Exercise per Session: Not on file  Stress:   . Feeling of Stress : Not on file  Social Connections:   . Frequency of Communication with Friends and Family: Not on file  . Frequency of Social Gatherings with Friends and Family: Not on file  . Attends Religious Services: Not on file  . Active Member of Clubs or Organizations: Not on file  . Attends Archivist Meetings: Not on file  . Marital Status: Not on file  Intimate Partner Violence:   . Fear of Current or Ex-Partner: Not on file  . Emotionally Abused: Not on file  . Physically Abused: Not on file  . Sexually Abused: Not on file     Family History  Problem Relation Age of Onset  . Parkinson's disease Mother   . Heart disease Father   . Ovarian cancer Sister      Current Facility-Administered Medications:  .  acetaminophen  (TYLENOL) tablet 650 mg, 650 mg, Oral, Q6H PRN **OR** acetaminophen (TYLENOL) suppository 650 mg, 650 mg, Rectal, Q6H PRN, Howerter, Justin B, DO .  calcium gluconate 1 g/ 50 mL sodium chloride IVPB, 1 g, Intravenous, Once, Wieting, Richard, MD, Last Rate: 50 mL/hr at 10/22/20 1218, 1,000 mg at 10/22/20 1218 .  dexamethasone (DECADRON) injection 4 mg, 4 mg, Intravenous, Q12H, Borders, Joshua R, NP .  dextrose 50 % solution 50 mL, 1 ampule, Intravenous, Once, Wieting, Richard, MD .  HYDROmorphone (DILAUDID) injection 0.5 mg, 0.5 mg, Intravenous, Q2H PRN, Borders, Kirt Boys, NP .  LORazepam (ATIVAN) injection 0.5 mg, 0.5 mg, Intravenous, Q3H PRN, Howerter, Justin B, DO, 2 mg at 10/21/20 2218 .  melatonin tablet 2.5 mg, 2.5 mg, Oral, QHS PRN, Howerter, Justin B, DO .  ondansetron (ZOFRAN) injection 4 mg, 4 mg, Intravenous, Q6H PRN, Loletha Grayer, MD, 4 mg at 10/22/20 1212 .  sodium bicarbonate injection 50 mEq, 50 mEq, Intravenous, Once, Wieting, Richard, MD  Current Outpatient Medications:  .  allopurinol (ZYLOPRIM) 100 MG tablet, daily., Disp: , Rfl:  .  ALPRAZolam (XANAX) 0.25 MG tablet, Take 1 tablet (0.25 mg total) by mouth 2 (two) times daily as needed for anxiety., Disp: 15 tablet, Rfl: 0 .  amiodarone (PACERONE) 200 MG tablet, Take 1 tablet (200 mg total) by mouth daily., Disp: 90 tablet, Rfl: 0 .  benzonatate (TESSALON) 200 MG capsule, Take 200 mg by mouth 3 (three) times daily as needed., Disp: , Rfl:  .  carvedilol (COREG) 6.25 MG tablet, Take 1 tablet (6.25 mg total) by mouth 2 (two) times daily with a meal., Disp: 180 tablet, Rfl: 3 .  guaiFENesin-codeine (ROBITUSSIN AC) 100-10 MG/5ML syrup, Take 5 mLs by mouth 3 (three) times daily as needed for cough., Disp: 120 mL, Rfl: 0 .  hydrALAZINE (APRESOLINE) 10 MG tablet, Take 1 tablet (10 mg total) by mouth 3 (three) times daily. (Patient taking differently: Take 10 mg by mouth daily. ), Disp: 270 tablet, Rfl: 1 .  isosorbide mononitrate  (IMDUR) 30 MG 24 hr tablet, Take 0.5 tablets (15 mg total) by mouth daily., Disp: 90 tablet, Rfl: 1 .  Multiple Vitamin (MULTIVITAMIN WITH MINERALS) TABS tablet, Take 2 tablets by mouth daily., Disp: , Rfl:  .  nitroGLYCERIN (NITROSTAT) 0.4 MG SL tablet, Place 0.4 mg under the tongue every 5 (five) minutes as needed for chest pain., Disp: , Rfl:  .  ondansetron (ZOFRAN-ODT) 4 MG  disintegrating tablet, Take 1 tablet (4 mg total) by mouth every 8 (eight) hours as needed for nausea or vomiting., Disp: 45 tablet, Rfl: 0 .  pantoprazole (PROTONIX) 40 MG tablet, Take by mouth., Disp: , Rfl:  .  Patiromer Sorbitex Calcium (VELTASSA PO), Take 1 Package by mouth once a week. (Patient not taking: Reported on 10/07/2020), Disp: , Rfl:  .  predniSONE (DELTASONE) 50 MG tablet, Take by mouth. Take 1 tablet (50 mg total) by mouth once daily Take 50mg  x 10d and then 25mg  x 4d, Disp: , Rfl:  .  Probiotic CAPS, Take 1 capsule by mouth daily., Disp: , Rfl:  .  Rivaroxaban (XARELTO) 15 MG TABS tablet, Take 15 mg by mouth daily with supper., Disp: , Rfl:  .  rosuvastatin (CRESTOR) 10 MG tablet, Take 1 tablet (10 mg total) by mouth daily., Disp: 90 tablet, Rfl: 1 .  sulfamethoxazole-trimethoprim (BACTRIM) 400-80 MG tablet, Take by mouth. Take 1 tablet (80 mg of trimethoprim total) by mouth every 12 (twelve) hours for 14 days, Disp: , Rfl:  .  torsemide (DEMADEX) 20 MG tablet, Take 1 tablet (20 mg total) by mouth as directed. Take 20 twice a day alternating with 20 once a day., Disp: 180 tablet, Rfl: 3 .  triamcinolone cream (KENALOG) 0.1 %, Apply 1 application topically daily as needed (itching)., Disp: , Rfl:    Physical exam:  Vitals:   10/22/20 0036 10/22/20 0543 10/22/20 0806 10/22/20 1220  BP:  102/65 110/67 (!) 105/59  Pulse:  72 65 85  Resp: 19 19 (!) 23 19  Temp:  98.8 F (37.1 C) 98.1 F (36.7 C)   TempSrc:  Oral Oral   SpO2:  97% 94% 91%  Weight:  200 lb 9.9 oz (91 kg)    Height:  5\' 6"  (1.676 m)      Physical Exam Constitutional:      Appearance: He is ill-appearing.  HENT:     Head: Normocephalic.     Mouth/Throat:     Mouth: Mucous membranes are dry.  Cardiovascular:     Rate and Rhythm: Normal rate. Rhythm irregular.  Pulmonary:     Breath sounds: No wheezing.     Comments: On nasal cannula oxygen.  Decreased breath sound bilaterally Abdominal:     Palpations: Abdomen is soft.  Musculoskeletal:     Cervical back: Normal range of motion.  Neurological:     Comments: Drowsy and confused         CMP Latest Ref Rng & Units 10/22/2020  Glucose 70 - 99 mg/dL 84  BUN 8 - 23 mg/dL 111(H)  Creatinine 0.61 - 1.24 mg/dL 5.40(H)  Sodium 135 - 145 mmol/L 132(L)  Potassium 3.5 - 5.1 mmol/L 5.9(H)  Chloride 98 - 111 mmol/L 94(L)  CO2 22 - 32 mmol/L 27  Calcium 8.9 - 10.3 mg/dL 8.2(L)  Total Protein 6.5 - 8.1 g/dL 5.7(L)  Total Bilirubin 0.3 - 1.2 mg/dL 1.7(H)  Alkaline Phos 38 - 126 U/L 141(H)  AST 15 - 41 U/L 56(H)  ALT 0 - 44 U/L 48(H)   CBC Latest Ref Rng & Units 10/22/2020  WBC 4.0 - 10.5 K/uL 9.7  Hemoglobin 13.0 - 17.0 g/dL 15.1  Hematocrit 39 - 52 % 44.8  Platelets 150 - 400 K/uL 97(L)    RADIOGRAPHIC STUDIES: I have personally reviewed the radiological images as listed and agreed with the findings in the report. CT CHEST WO CONTRAST  Result Date: 09/26/2020 CLINICAL DATA:  Productive  cough. EXAM: CT CHEST WITHOUT CONTRAST TECHNIQUE: Multidetector CT imaging of the chest was performed following the standard protocol without IV contrast. COMPARISON:  None. FINDINGS: Cardiovascular: Atherosclerosis of thoracic aorta is noted without aneurysm formation. Normal cardiac size. No pericardial effusion is noted. Coronary artery calcifications are noted. Mediastinum/Nodes: No enlarged mediastinal or axillary lymph nodes. Thyroid gland, trachea, and esophagus demonstrate no significant findings. Lungs/Pleura: No pneumothorax is noted. Emphysematous disease is noted. Left  lung is clear. Moderate right pleural effusion is noted with probable adjacent right lower lobe subsegmental atelectasis. Right upper lobe airspace Passy is noted concerning for pneumonia. Upper Abdomen: No acute abnormality. Musculoskeletal: No chest wall mass or suspicious bone lesions identified. IMPRESSION: 1. Moderate right pleural effusion is noted with probable adjacent right lower lobe subsegmental atelectasis. 2. Right upper lobe airspace Passy is noted concerning for pneumonia. 3. Coronary artery calcifications are noted suggesting coronary artery disease. 4. Emphysema and aortic atherosclerosis. Aortic Atherosclerosis (ICD10-I70.0) and Emphysema (ICD10-J43.9). Electronically Signed   By: Marijo Conception M.D.   On: 09/26/2020 14:54   US Renal  Result Date: 10/21/2020 CLINICAL DATA:  Acute renal failure EXAM: RENAL / URINARY TRACT ULTRASOUND COMPLETE COMPARISON:  04/03/2020 FINDINGS: Right Kidney: Renal measurements: 8.4 x 5.5 x 4 cm = volume: 97 mL. There is increased cortical echogenicity without evidence for hydronephrosis. There are few small cortical cysts. Left Kidney: Renal measurements: 9.8 x 5.6 x 4.4 cm = volume: 125 mL. There is no hydronephrosis. There are small cysts. There is increased cortical echogenicity. Bladder: Appears normal for degree of bladder distention. Other: None. IMPRESSION: Echogenic kidneys bilaterally which can be seen in patients with medical renal disease. No hydronephrosis. Electronically Signed   By: Constance Holster M.D.   On: 10/21/2020 18:57   NM PET Image Initial (PI) Skull Base To Thigh  Result Date: 10/21/2020 CLINICAL DATA:  Initial treatment strategy for adenocarcinoma of the RIGHT lung. EXAM: NUCLEAR MEDICINE PET SKULL BASE TO THIGH TECHNIQUE: 10.97 mCi F-18 FDG was injected intravenously. Full-ring PET imaging was performed from the skull base to thigh after the radiotracer. CT data was obtained and used for attenuation correction and anatomic  localization. Fasting blood glucose: 102 mg/dl COMPARISON:  Chest CT of September 26, 2020 FINDINGS: Mediastinal blood pool activity: SUV max 3.0 Liver activity: SUV max NA NECK: Hypermetabolic LEFT supraclavicular lymph node (image 59, series 3) approximately 6 mm with SUV value of approximately 4.5 Small RIGHT level 2 lymph node (image 38, series 3) (SUVmax = 3.2) Another adjacent subcentimeter RIGHT level 2 lymph node with similar FDG uptake. Incidental CT findings: none CHEST: Extensive increased FDG uptake throughout the chest. Diffuse septal thickening in the RIGHT perihilar region involving RIGHT upper lobe and RIGHT middle lobe. Nodule in the RIGHT upper lobe on image 88 of series 3 is intensely FDG avid with an SUV of 8.9 FDG uptake is seen that is above blood pool in other areas of consolidative change in septal thickening throughout the RIGHT chest., for instance on image 101 of series 3 consolidative areas extending anteriorly into the RIGHT upper lobe with an SUV of 5.0 RIGHT hilar adenopathy (image 99, series 3) (SUVmax = 9.0) Subcarinal adenopathy with increased FDG uptake as well (image 99, series 3) 8 mm with a maximum SUV of 7.8 Unenlarged but mildly hypermetabolic lymph nodes elsewhere in the chest. Focal hypermetabolic activity on image 104 of series 3 corresponds to the esophagus without definite visible lesion in this area, perhaps a small lymph  node or small esophageal lesion with a maximum SUV of 5.9. There is subtle change in density at the level of increased metabolic activity of uncertain significance. Scattered nonspecific LEFT axillary lymph nodes with mild increased FDG uptake. Incidental CT findings: Calcified atheromatous plaque in the thoracic aorta. Three-vessel coronary artery disease. Heart size mildly enlarged without pericardial effusion. Diffuse nodular septal thickening in the RIGHT chest and areas of consolidative change. Moderate RIGHT-sided loculated pleural fluid.  Pulmonary  emphysema. Small nodule in the LEFT lung base 6 mm without definitive signs of FDG uptake, certainly not above blood pool. ABDOMEN/PELVIS: Nodular contour of the liver. Focal area of increased FDG uptake near the dome of the RIGHT hemi liver (image 31, series 3) (SUVmax = 8.8) Diffuse increased FDG uptake throughout the nodular appearing LEFT hepatic lobe without clear signs of discrete mass on noncontrast imaging. Similar pattern in the RIGHT hepatic lobe posteriorly mainly hepatic subsegment VII and VI also without discrete focal hepatic lesion aside from a single lesion on image 157 of series 3. Note that for reference what appears to represent normal background hepatic activity is 4.8 with 10.2 maximum SUV in posterior RIGHT hepatic lobe and similar FDG uptake in the LEFT hepatic lobe. LEFT adrenal nodularity with intense FDG uptake, nodularity on the RIGHT with less FDG uptake. LEFT adrenal lesion approximately 13 mm with a maximum SUV of 6.8 no adenopathy in the abdomen with increased metabolic activity. No adenopathy in the pelvis. Incidental CT findings: Cystic renal lesions and lesions of varying complexity of the bilateral kidneys without signs of focal, suspicious FDG uptake. Mild parenchymal thinning. No hydronephrosis. No pericholecystic stranding. Normal spleen. Normal pancreas. Calcified atheromatous plaque in the abdominal aorta. No aneurysmal dilation. Prostate grossly unremarkable though obscured by streak artifact from bilateral hip arthroplasty changes. SKELETON: Diffuse bony metastatic disease. Multifocal lesions in bilateral ribs. Lesions at multiple levels throughout the spine with involvement of T1 through T12 mainly in anterior elements. Posterior elements at T5 and T6 are involved in also at T12. All lumbar levels with varying degrees of involvement with diffuse involvement of L2, L3 and in particular. Diffuse involvement of the body of the sternum with a maximum SUV of 8.1. T10 with  maximum SUV of 8.2. L2 with a maximum SUV of 8.9 Diffuse central upper and RIGHT sacral involvement (image 212) on fused dataset. Subtle heterogeneity in this location without discrete lesion. Maximum SUV of approximately 8.2. Scattered lesions throughout the remainder of the pelvis, LEFT iliac along the LEFT iliac wing and RIGHT posterior iliac bone. Many of the above lesions display subtly lytic characteristics or subtly sclerotic characteristics but many are nearly occult from a radiographic perspective on CT images. Chronic appearing L1 compression deformity. Incidental CT findings: Multilevel spinal degenerative changes. IMPRESSION: 1. Findings that are suspicious for pulmonary neoplasm with lymphangitic carcinomatosis, neck and chest adenopathy, diffuse bony metastatic disease and metastatic disease to the adrenals and potentially liver. 2. Areas of diffuse uptake without discrete lesion but with a single lesion that can be visualized on CT images in the RIGHT hepatic lobe. Infiltrative neoplasm is considered. Consider follow-up MRI of the liver for further assessment. Nodularity of the liver also raising the question of background liver disease. 3. Focal area of increased uptake in the esophagus may represent a small lymph node adjacent to the esophagus. There is subtle increase in soft tissue without definite lesion in this location. Dedicated esophageal evaluation may be helpful as warranted. 4. C2 with increased FDG uptake,  also some FDG uptake associated with degenerative changes in the middle lower cervical spine maximum SUV at C2 is 3.9 These results will be called to the ordering clinician or representative by the Radiologist Assistant, and communication documented in the PACS or [employer]. Electronically Signed   By: Zetta Bills M.D.   On: 10/21/2020 15:10   DG Chest Port 1 View  Result Date: 10/15/2020 CLINICAL DATA:  Status post right thoracentesis. EXAM: PORTABLE CHEST 1 VIEW  COMPARISON:  09/29/2020 FINDINGS: No pneumothorax after right thoracentesis. No significant residual pleural fluid by x-ray. Persistent opacity in the right perihilar lung is suspicious for malignancy. No edema. IMPRESSION: No pneumothorax after right thoracentesis. No significant residual right pleural fluid by x-ray. Persistent right perihilar lung opacity suspicious for malignancy. Electronically Signed   By: Aletta Edouard M.D.   On: 10/15/2020 10:13   DG Chest Port 1 View  Result Date: 09/29/2020 CLINICAL DATA:  Status post thoracentesis EXAM: PORTABLE CHEST 1 VIEW COMPARISON:  Chest CT September 25, 2020 FINDINGS: No pneumothorax. Essentially complete resolution of pleural effusion after thoracentesis. Airspace consolidation noted in the right mid lung, essentially stable compared to recent study. Left lung clear. Heart is borderline enlarged with pulmonary vascularity normal. No adenopathy. No bone lesions. IMPRESSION: No pneumothorax with resolution of pleural effusion following thoracentesis. Consolidation right mid lung, most likely due to pneumonia. An obstructing bronchial lesion could present in this manner. Serial evaluation to clearing advised. Lungs elsewhere clear. Heart borderline enlarged with pulmonary vascularity normal. No adenopathy. Followup PA and lateral chest radiographs recommended in 3-4 weeks following trial of antibiotic therapy to ensure resolution and exclude underlying malignancy. Electronically Signed   By: Lowella Grip III M.D.   On: 09/29/2020 14:48   DG Abdomen Acute W/Chest  Result Date: 10/21/2020 CLINICAL DATA:  86 year old male with weakness, nausea vomiting. EXAM: DG ABDOMEN ACUTE WITH 1 VIEW CHEST COMPARISON:  Chest radiograph dated 10/15/2020. FINDINGS: Small right pleural effusion. Right perihilar streaky densities again noted. No new consolidation. There is background of emphysema. No pneumothorax. The cardiac silhouette is within limits. There is moderate  stool throughout the colon. No bowel dilatation or evidence of obstruction. No free air or radiopaque calculi. Degenerative changes of the spine. Bilateral hip arthroplasties. No acute osseous pathology. IMPRESSION: 1. Right perihilar density as seen on the earlier radiograph. No pneumothorax. 2. Small right pleural effusion. 3. Emphysema. 4. Constipation.  No bowel obstruction. Electronically Signed   By: Anner Crete M.D.   On: 10/21/2020 19:01   US THORACENTESIS ASP PLEURAL SPACE W/IMG GUIDE  Result Date: 10/15/2020 CLINICAL DATA:  Recurrent right pleural effusion with prior thoracentesis on 09/29/2020. Cytology revealed adenocarcinoma in pleural fluid. EXAM: ULTRASOUND GUIDED RIGHT THORACENTESIS COMPARISON:  None. PROCEDURE: An ultrasound guided thoracentesis was thoroughly discussed with the patient and questions answered. The benefits, risks, alternatives and complications were also discussed. The patient understands and wishes to proceed with the procedure. Written consent was obtained. A time-out was performed prior to initiating the procedure. Ultrasound was performed to localize and mark an adequate pocket of fluid in the right chest. The area was then prepped and draped in the normal sterile fashion. 1% Lidocaine was used for local anesthesia. Under ultrasound guidance a 6 French Safe-T-Centesis catheter was introduced. Thoracentesis was performed. The catheter was removed and a dressing applied. COMPLICATIONS: None FINDINGS: A total of approximately 1.3 L of serosanguineous fluid was removed. A fluid sample was sent for laboratory analysis. IMPRESSION: Successful ultrasound guided right  thoracentesis yielding 1.3 L of pleural fluid. Electronically Signed   By: Aletta Edouard M.D.   On: 10/15/2020 10:10   US THORACENTESIS ASP PLEURAL SPACE W/IMG GUIDE  Result Date: 09/29/2020 INDICATION: Shortness of breath. Large right pleural effusion. Recent history of pneumonia. Request diagnostic and  therapeutic thoracentesis. EXAM: ULTRASOUND GUIDED RIGHT THORACENTESIS MEDICATIONS: 1% plain lidocaine, 10 mL COMPLICATIONS: None immediate. Postprocedural chest x-ray negative for pneumothorax PROCEDURE: An ultrasound guided thoracentesis was thoroughly discussed with the patient and questions answered. The benefits, risks, alternatives and complications were also discussed. The patient understands and wishes to proceed with the procedure. Written consent was obtained. Ultrasound was performed to localize and mark an adequate pocket of fluid in the right chest. The area was then prepped and draped in the normal sterile fashion. 1% Lidocaine was used for local anesthesia. Under ultrasound guidance a 6 Fr Safe-T-Centesis catheter was introduced. Thoracentesis was performed. The catheter was removed and a dressing applied. FINDINGS: A total of approximately 1.3 L of clear, dark yellow fluid was removed. Samples were sent to the laboratory as requested by the clinical team. IMPRESSION: Successful ultrasound guided right thoracentesis yielding 1.3 L of pleural fluid. Read by: Ascencion Dike PA-C Electronically Signed   By: Marcello Moores  Register   On: 09/29/2020 14:53    Assessment and plan-   #Metastatic lung adenocarcinoma with lymphangitic spread Extensive involvement including cervical and thoracic adenopathy, bone lesions, adrenal, liver I had a lengthy discussion with patient, wife and grandson that patient's prognosis is extremely poor due to the aggressive nature of the disease, patient's poor performance status, multiple medical problems. Patient has acute on chronic kidney failure with renal function and potassium level worsened despite hydration He is not a candidate for any cancer treatments given the declining performance status.  I recommend comfort care and transfer to hospice home bed is available.  CODE STATUS DNR/DNI. Both patient and family were in agreement with this plan. Continue supportive  care with antiemetics, pain control.  Chaplain consult was requested.   Thank you for allowing me to participate in the care of this patient.    Earlie Server, MD, PhD Hematology Oncology [facility] Pager- [phone] 10/22/2020

## 2020-10-23 ENCOUNTER — Inpatient Hospital Stay: Payer: PPO | Admitting: Hospice and Palliative Medicine

## 2020-10-23 ENCOUNTER — Inpatient Hospital Stay: Payer: PPO | Admitting: Oncology

## 2020-10-23 DIAGNOSIS — D696 Thrombocytopenia, unspecified: Secondary | ICD-10-CM

## 2020-10-23 DIAGNOSIS — Z515 Encounter for palliative care: Secondary | ICD-10-CM | POA: Diagnosis not present

## 2020-10-23 DIAGNOSIS — I5022 Chronic systolic (congestive) heart failure: Secondary | ICD-10-CM

## 2020-10-23 DIAGNOSIS — N179 Acute kidney failure, unspecified: Secondary | ICD-10-CM | POA: Diagnosis not present

## 2020-10-23 DIAGNOSIS — R1310 Dysphagia, unspecified: Secondary | ICD-10-CM

## 2020-10-23 MED ORDER — MELATONIN 5 MG PO TABS: 2.5000 mg | ORAL_TABLET | Freq: Every evening | ORAL | 0 refills | Status: AC | PRN

## 2020-10-23 MED ORDER — LORAZEPAM 2 MG/ML IJ SOLN: 0.5000 mg | INTRAMUSCULAR | 0 refills | Status: AC | PRN

## 2020-10-23 MED ORDER — DEXAMETHASONE SODIUM PHOSPHATE 4 MG/ML IJ SOLN: 4.0000 mg | Freq: Two times a day (BID) | INTRAMUSCULAR | Status: AC

## 2020-10-23 MED ORDER — ONDANSETRON HCL 4 MG/2ML IJ SOLN: 4.0000 mg | Freq: Four times a day (QID) | INTRAMUSCULAR | 0 refills | Status: AC | PRN

## 2020-10-23 MED ORDER — GLYCOPYRROLATE 0.2 MG/ML IJ SOLN: 0.4000 mg | Freq: Four times a day (QID) | INTRAMUSCULAR | Status: AC | PRN

## 2020-10-23 MED ORDER — ACETAMINOPHEN 650 MG RE SUPP: 650.0000 mg | Freq: Four times a day (QID) | RECTAL | 0 refills | Status: AC | PRN

## 2020-10-23 MED ORDER — HYDROMORPHONE HCL 1 MG/ML IJ SOLN: 0.5000 mg | INTRAMUSCULAR | 0 refills | Status: AC | PRN

## 2020-10-23 MED ORDER — ACETAMINOPHEN 325 MG PO TABS: 650.0000 mg | ORAL_TABLET | Freq: Four times a day (QID) | ORAL | Status: AC | PRN

## 2020-10-23 NOTE — Progress Notes (Addendum)
Discharge Note: Vitals obtained.  Per hospice nurse, Pt is to be discharge with IV cath. Pt transported via EMS to Hospice facility.

## 2020-10-23 NOTE — Discharge Summary (Signed)
[facility] [place] NAME: [name] [name]    MR#:  [id number]  DATE OF BIRTH:  1931/02/28  DATE OF ADMISSION:  10/21/2020 ADMITTING PHYSICIAN: Loletha Grayer, MD  DATE OF DISCHARGE: 10/23/2020  PRIMARY CARE PHYSICIAN: [name], MD    ADMISSION DIAGNOSIS:  Weakness [R53.1] AKI (acute kidney injury) ([place]) [N17.9] Acute renal failure, unspecified acute renal failure type ([place]) [N17.9] Acute kidney injury superimposed on CKD ([place]) [N17.9, N18.9]  DISCHARGE DIAGNOSIS:  Principal Problem:   AKI (acute kidney injury) ([place]) Active Problems:   HYPERTENSION, BENIGN   Hyperkalemia   Renal failure (ARF), acute on chronic (HCC)   Primary malignant neoplasm of lung metastatic to other site [facility])   Nausea & vomiting   Generalized weakness   Hyponatremia   Acute kidney injury superimposed on CKD (HCC)   End of life care   Goals of care, counseling/discussion   Thrombocytopenia ([place])   Chronic systolic CHF (congestive heart failure) ([place])   Dysphagia   SECONDARY DIAGNOSIS:   Past Medical History:  Diagnosis Date  . Arthritis   . Asymptomatic Sinus bradycardia   . CAD (coronary artery disease)    a. 10/2005 NSTEMI/PCI: LAD 95 (2.75x33 Cypher DES); b. 2008 Ant STEMI/PCI LAD 2/2 ISR; c. 09/2011 PCI OM2; d. 02/2019 MV: Fixed mid-dist ant/apical, mid-apical inferolateral dfects w/o ischemia; e. 12/2019 Cath: LM nl, LAD 25p/m, patent stent, D2 80, LCX nl, OM2 patent stent, RCA min irregs.  . CKD (chronic kidney disease), stage III ([place])   . HFrEF (heart failure with reduced ejection fraction) ([place])    a. 12/2019 Echo: EF 20-25%, glob HK. Mid-apical septum, inf, apical AK. Mildly reduced RV fxn. Triv MR.  Marland Kitchen Hyperlipidemia   . Hypertension   . Ischemic cardiomyopathy    a. 07/2015 Echo: EF 45-50%; b. 12/2019 Echo: EF 20-25%.  Marland Kitchen LBBB (left bundle branch block)   . Myocardial infarction ([place])   . Persistent atrial fibrillation ([place])    a.   Diagnosed January 2021.  CHA2DS2VASc = 5--> xarelto.  . Tobacco user    Remote    HOSPITAL COURSE:   1.  End-of-life care.  Patient was admitted with shortness of breath.  Patient was seen by palliative care and oncology and made comfort care measures.  Patient is a DO NOT RESUSCITATE.  Patient accepted to the hospice home.  Continue as needed IV Dilaudid.  As needed glycopyrrolate and Ativan.  Overall prognosis is poor. 2.  Acute kidney injury on chronic kidney disease stage IV.  No further lab draws during the hospital course. 3.  Severe hyperkalemia secondary to acute kidney injury.  Patient was treated with short acting medications yesterday and no further lab draws today. 4.  Metastatic lung cancer.  Overall prognosis poor and made comfort care measures 5.  Paroxysmal atrial fibrillation.  Medication stopped. 6.  Hyponatremia 7.  Thrombocytopenia 8.  Chronic systolic congestive heart failure 9.  Dysphagia.  Patient coughing even with sips of water.  DISCHARGE CONDITIONS:   Guarded  CONSULTS OBTAINED:  Palliative care, oncology  DRUG ALLERGIES:   Allergies  Allergen Reactions  . Penicillins Rash    Did it involve swelling of the face/tongue/throat, SOB, or low BP? No Did it involve sudden or severe rash/hives, skin peeling, or any reaction on the inside of your mouth or nose? No Did you need to seek medical attention at a hospital or doctor's office? No When did it last happen?2-3 years  If all above answers are "NO", may proceed with cephalosporin use.     DISCHARGE MEDICATIONS:   Allergies as of 10/23/2020      Reactions   Penicillins Rash   Did it involve swelling of the face/tongue/throat, SOB, or low BP? No Did it involve sudden or severe rash/hives, skin peeling, or any reaction on the inside of your mouth or nose? No Did you need to seek medical attention at a hospital or doctor's office? No When did it last happen?2-3 years If all above answers are  "NO", may proceed with cephalosporin use.      Medication List    STOP taking these medications   allopurinol 100 MG tablet Commonly known as: ZYLOPRIM   ALPRAZolam 0.25 MG tablet Commonly known as: XANAX   amiodarone 200 MG tablet Commonly known as: PACERONE   benzonatate 200 MG capsule Commonly known as: TESSALON   carvedilol 6.25 MG tablet Commonly known as: COREG   guaiFENesin-codeine 100-10 MG/5ML syrup Commonly known as: ROBITUSSIN AC   hydrALAZINE 10 MG tablet Commonly known as: APRESOLINE   isosorbide mononitrate 30 MG 24 hr tablet Commonly known as: IMDUR   multivitamin with minerals Tabs tablet   nitroGLYCERIN 0.4 MG SL tablet Commonly known as: NITROSTAT   ondansetron 4 MG disintegrating tablet Commonly known as: ZOFRAN-ODT   pantoprazole 40 MG tablet Commonly known as: PROTONIX   predniSONE 50 MG tablet Commonly known as: DELTASONE   Probiotic Caps   Rivaroxaban 15 MG Tabs tablet Commonly known as: XARELTO   rosuvastatin 10 MG tablet Commonly known as: CRESTOR   sulfamethoxazole-trimethoprim 400-80 MG tablet Commonly known as: BACTRIM   torsemide 20 MG tablet Commonly known as: DEMADEX   triamcinolone cream 0.1 % Commonly known as: KENALOG   VELTASSA PO     TAKE these medications   acetaminophen 325 MG tablet Commonly known as: TYLENOL Take 2 tablets (650 mg total) by mouth every 6 (six) hours as needed for mild pain (or Fever >/= 101).   acetaminophen 650 MG suppository Commonly known as: TYLENOL Place 1 suppository (650 mg total) rectally every 6 (six) hours as needed for mild pain (or Fever >/= 101).   dexamethasone 4 MG/ML injection Commonly known as: DECADRON Inject 1 mL (4 mg total) into the vein every 12 (twelve) hours.   glycopyrrolate 0.2 MG/ML injection Commonly known as: ROBINUL Inject 2 mLs (0.4 mg total) into the vein every 6 (six) hours as needed (secretions).   HYDROmorphone 1 MG/ML injection Commonly known  as: DILAUDID Inject 0.5 mLs (0.5 mg total) into the vein every 2 (two) hours as needed for moderate pain (or dyspnea).   LORazepam 2 MG/ML injection Commonly known as: ATIVAN Inject 0.25 mLs (0.5 mg total) into the vein every 3 (three) hours as needed (nausea or anxiety).   melatonin 5 MG Tabs Take 0.5 tablets (2.5 mg total) by mouth at bedtime as needed (insomnia).   ondansetron 4 MG/2ML Soln injection Commonly known as: ZOFRAN Inject 2 mLs (4 mg total) into the vein every 6 (six) hours as needed for nausea or vomiting.        DISCHARGE INSTRUCTIONS:   Follow-up at the hospice home 1 day  If you experience worsening of your admission symptoms, develop shortness of breath, life threatening emergency, suicidal or homicidal thoughts you must seek medical attention immediately by calling 911 or calling your MD immediately  if symptoms less severe.  You Must read complete instructions/literature along with all the possible adverse  reactions/side effects for all the Medicines you take and that have been prescribed to you. Take any new Medicines after you have completely understood and accept all the possible adverse reactions/side effects.   Please note  You were cared for by a hospitalist during your hospital stay. If you have any questions about your discharge medications or the care you received while you were in the hospital after you are discharged, you can call the unit and asked to speak with the hospitalist on call if the hospitalist that took care of you is not available. Once you are discharged, your primary care physician will handle any further medical issues. Please note that NO REFILLS for any discharge medications will be authorized once you are discharged, as it is imperative that you return to your primary care physician (or establish a relationship with a primary care physician if you do not have one) for your aftercare needs so that they can reassess your need for  medications and monitor your lab values.    Today   CHIEF COMPLAINT:   Chief Complaint  Patient presents with  . Weakness    HISTORY OF PRESENT ILLNESS:  [name] [name]  is a 86 y.o. male came in with weakness and shortness of breath   VITAL SIGNS:  Blood pressure 128/61, pulse 72, temperature 98.1 F (36.7 C), temperature source Oral, resp. rate 16, height 5\' 6"  (1.676 m), weight 91 kg, SpO2 95 %.    DATA REVIEW:   CBC Recent Labs  Lab 10/22/20 0639  WBC 9.7  HGB 15.1  HCT 44.8  PLT 97*    Chemistries  Recent Labs  Lab 10/22/20 0639  NA 132*  K 5.9*  CL 94*  CO2 27  GLUCOSE 84  BUN 111*  CREATININE 5.40*  CALCIUM 8.2*  MG 3.5*  AST 56*  ALT 48*  ALKPHOS 141*  BILITOT 1.7*     Microbiology Results  Results for orders placed or performed during the hospital encounter of 10/21/20  Respiratory Panel by RT PCR (Flu A&B, Covid) - Nasopharyngeal Swab     Status: None   Collection Time: 10/21/20 10:23 PM   Specimen: Nasopharyngeal Swab  Result Value Ref Range Status   SARS Coronavirus 2 by RT PCR NEGATIVE NEGATIVE Final    Comment: (NOTE) SARS-CoV-2 target nucleic acids are NOT DETECTED.  The SARS-CoV-2 RNA is generally detectable in upper respiratoy specimens during the acute phase of infection. The lowest concentration of SARS-CoV-2 viral copies this assay can detect is 131 copies/mL. A negative result does not preclude SARS-Cov-2 infection and should not be used as the sole basis for treatment or other patient management decisions. A negative result may occur with  improper specimen collection/handling, submission of specimen other than nasopharyngeal swab, presence of viral mutation(s) within the areas targeted by this assay, and inadequate number of viral copies (<131 copies/mL). A negative result must be combined with clinical observations, patient history, and epidemiological information. The expected result is Negative.  Fact Sheet for  Patients:  PinkCheek.be  Fact Sheet for Healthcare Providers:  GravelBags.it  This test is no t yet approved or cleared by the [place] FDA and  has been authorized for detection and/or diagnosis of SARS-CoV-2 by FDA under an Emergency Use Authorization (EUA). This EUA will remain  in effect (meaning this test can be used) for the duration of the COVID-19 declaration under Section 564(b)(1) of the Act, 21 U.S.C. section 360bbb-3(b)(1), unless the authorization is terminated or revoked sooner.  Influenza A by PCR NEGATIVE NEGATIVE Final   Influenza B by PCR NEGATIVE NEGATIVE Final    Comment: (NOTE) The Xpert Xpress SARS-CoV-2/FLU/RSV assay is intended as an aid in  the diagnosis of influenza from Nasopharyngeal swab specimens and  should not be used as a sole basis for treatment. Nasal washings and  aspirates are unacceptable for Xpert Xpress SARS-CoV-2/FLU/RSV  testing.  Fact Sheet for Patients: PinkCheek.be  Fact Sheet for Healthcare Providers: GravelBags.it  This test is not yet approved or cleared by the [place] FDA and  has been authorized for detection and/or diagnosis of SARS-CoV-2 by  FDA under an Emergency Use Authorization (EUA). This EUA will remain  in effect (meaning this test can be used) for the duration of the  Covid-19 declaration under Section 564(b)(1) of the Act, 21  U.S.C. section 360bbb-3(b)(1), unless the authorization is  terminated or revoked. Performed at [facility], [address] [place], [place] [address]     RADIOLOGY:  US Renal  Result Date: 10/21/2020 CLINICAL DATA:  Acute renal failure EXAM: RENAL / URINARY TRACT ULTRASOUND COMPLETE COMPARISON:  04/03/2020 FINDINGS: Right Kidney: Renal measurements: 8.4 x 5.5 x 4 cm = volume: 97 mL. There is increased cortical echogenicity without evidence for  hydronephrosis. There are few small cortical cysts. Left Kidney: Renal measurements: 9.8 x 5.6 x 4.4 cm = volume: 125 mL. There is no hydronephrosis. There are small cysts. There is increased cortical echogenicity. Bladder: Appears normal for degree of bladder distention. Other: None. IMPRESSION: Echogenic kidneys bilaterally which can be seen in patients with medical renal disease. No hydronephrosis. Electronically Signed   By: Constance Holster M.D.   On: 10/21/2020 18:57   NM PET Image Initial (PI) Skull Base To Thigh  Result Date: 10/21/2020 CLINICAL DATA:  Initial treatment strategy for adenocarcinoma of the RIGHT lung. EXAM: NUCLEAR MEDICINE PET SKULL BASE TO THIGH TECHNIQUE: 10.97 mCi F-18 FDG was injected intravenously. Full-ring PET imaging was performed from the skull base to thigh after the radiotracer. CT data was obtained and used for attenuation correction and anatomic localization. Fasting blood glucose: 102 mg/dl COMPARISON:  Chest CT of September 26, 2020 FINDINGS: Mediastinal blood pool activity: SUV max 3.0 Liver activity: SUV max NA NECK: Hypermetabolic LEFT supraclavicular lymph node (image 59, series 3) approximately 6 mm with SUV value of approximately 4.5 Small RIGHT level 2 lymph node (image 38, series 3) (SUVmax = 3.2) Another adjacent subcentimeter RIGHT level 2 lymph node with similar FDG uptake. Incidental CT findings: none CHEST: Extensive increased FDG uptake throughout the chest. Diffuse septal thickening in the RIGHT perihilar region involving RIGHT upper lobe and RIGHT middle lobe. Nodule in the RIGHT upper lobe on image 88 of series 3 is intensely FDG avid with an SUV of 8.9 FDG uptake is seen that is above blood pool in other areas of consolidative change in septal thickening throughout the RIGHT chest., for instance on image 101 of series 3 consolidative areas extending anteriorly into the RIGHT upper lobe with an SUV of 5.0 RIGHT hilar adenopathy (image 99, series 3) (SUVmax  = 9.0) Subcarinal adenopathy with increased FDG uptake as well (image 99, series 3) 8 mm with a maximum SUV of 7.8 Unenlarged but mildly hypermetabolic lymph nodes elsewhere in the chest. Focal hypermetabolic activity on image 104 of series 3 corresponds to the esophagus without definite visible lesion in this area, perhaps a small lymph node or small esophageal lesion with a maximum SUV of 5.9. There  is subtle change in density at the level of increased metabolic activity of uncertain significance. Scattered nonspecific LEFT axillary lymph nodes with mild increased FDG uptake. Incidental CT findings: Calcified atheromatous plaque in the thoracic aorta. Three-vessel coronary artery disease. Heart size mildly enlarged without pericardial effusion. Diffuse nodular septal thickening in the RIGHT chest and areas of consolidative change. Moderate RIGHT-sided loculated pleural fluid.  Pulmonary emphysema. Small nodule in the LEFT lung base 6 mm without definitive signs of FDG uptake, certainly not above blood pool. ABDOMEN/PELVIS: Nodular contour of the liver. Focal area of increased FDG uptake near the dome of the RIGHT hemi liver (image 31, series 3) (SUVmax = 8.8) Diffuse increased FDG uptake throughout the nodular appearing LEFT hepatic lobe without clear signs of discrete mass on noncontrast imaging. Similar pattern in the RIGHT hepatic lobe posteriorly mainly hepatic subsegment VII and VI also without discrete focal hepatic lesion aside from a single lesion on image 157 of series 3. Note that for reference what appears to represent normal background hepatic activity is 4.8 with 10.2 maximum SUV in posterior RIGHT hepatic lobe and similar FDG uptake in the LEFT hepatic lobe. LEFT adrenal nodularity with intense FDG uptake, nodularity on the RIGHT with less FDG uptake. LEFT adrenal lesion approximately 13 mm with a maximum SUV of 6.8 no adenopathy in the abdomen with increased metabolic activity. No adenopathy in the  pelvis. Incidental CT findings: Cystic renal lesions and lesions of varying complexity of the bilateral kidneys without signs of focal, suspicious FDG uptake. Mild parenchymal thinning. No hydronephrosis. No pericholecystic stranding. Normal spleen. Normal pancreas. Calcified atheromatous plaque in the abdominal aorta. No aneurysmal dilation. Prostate grossly unremarkable though obscured by streak artifact from bilateral hip arthroplasty changes. SKELETON: Diffuse bony metastatic disease. Multifocal lesions in bilateral ribs. Lesions at multiple levels throughout the spine with involvement of T1 through T12 mainly in anterior elements. Posterior elements at T5 and T6 are involved in also at T12. All lumbar levels with varying degrees of involvement with diffuse involvement of L2, L3 and in particular. Diffuse involvement of the body of the sternum with a maximum SUV of 8.1. T10 with maximum SUV of 8.2. L2 with a maximum SUV of 8.9 Diffuse central upper and RIGHT sacral involvement (image 212) on fused dataset. Subtle heterogeneity in this location without discrete lesion. Maximum SUV of approximately 8.2. Scattered lesions throughout the remainder of the pelvis, LEFT iliac along the LEFT iliac wing and RIGHT posterior iliac bone. Many of the above lesions display subtly lytic characteristics or subtly sclerotic characteristics but many are nearly occult from a radiographic perspective on CT images. Chronic appearing L1 compression deformity. Incidental CT findings: Multilevel spinal degenerative changes. IMPRESSION: 1. Findings that are suspicious for pulmonary neoplasm with lymphangitic carcinomatosis, neck and chest adenopathy, diffuse bony metastatic disease and metastatic disease to the adrenals and potentially liver. 2. Areas of diffuse uptake without discrete lesion but with a single lesion that can be visualized on CT images in the RIGHT hepatic lobe. Infiltrative neoplasm is considered. Consider follow-up  MRI of the liver for further assessment. Nodularity of the liver also raising the question of background liver disease. 3. Focal area of increased uptake in the esophagus may represent a small lymph node adjacent to the esophagus. There is subtle increase in soft tissue without definite lesion in this location. Dedicated esophageal evaluation may be helpful as warranted. 4. C2 with increased FDG uptake, also some FDG uptake associated with degenerative changes in the middle lower  cervical spine maximum SUV at C2 is 3.9 These results will be called to the ordering clinician or representative by the Radiologist Assistant, and communication documented in the PACS or [employer]. Electronically Signed   By: Zetta Bills M.D.   On: 10/21/2020 15:10   DG Abdomen Acute W/Chest  Result Date: 10/21/2020 CLINICAL DATA:  86 year old male with weakness, nausea vomiting. EXAM: DG ABDOMEN ACUTE WITH 1 VIEW CHEST COMPARISON:  Chest radiograph dated 10/15/2020. FINDINGS: Small right pleural effusion. Right perihilar streaky densities again noted. No new consolidation. There is background of emphysema. No pneumothorax. The cardiac silhouette is within limits. There is moderate stool throughout the colon. No bowel dilatation or evidence of obstruction. No free air or radiopaque calculi. Degenerative changes of the spine. Bilateral hip arthroplasties. No acute osseous pathology. IMPRESSION: 1. Right perihilar density as seen on the earlier radiograph. No pneumothorax. 2. Small right pleural effusion. 3. Emphysema. 4. Constipation.  No bowel obstruction. Electronically Signed   By: Anner Crete M.D.   On: 10/21/2020 19:01     Management plans discussed with the patient, family and they are in agreement.  CODE STATUS:     Code Status Orders  (From admission, onward)         Start     Ordered   10/21/20 2151  Do not attempt resuscitation (DNR)  Continuous       Question Answer Comment  In the event of  cardiac or respiratory ARREST Do not call a "code blue"   In the event of cardiac or respiratory ARREST Do not perform Intubation, CPR, defibrillation or ACLS   In the event of cardiac or respiratory ARREST Use medication by any route, position, wound care, and other measures to relive pain and suffering. May use oxygen, suction and manual treatment of airway obstruction as needed for comfort.      10/21/20 2152        Code Status History    Date Active Date Inactive Code Status Order ID Comments User Context   10/21/2020 2141 10/21/2020 2152 DNR [id number]  Rhetta Mura, DO ED   01/19/2020 1941 01/21/2020 1551 Full Code [id number]  Jani Gravel, MD ED   06/29/2018 1201 06/30/2018 2019 Full Code [id number]  N[facility] [place] Inpatient   08/03/2015 1632 08/04/2015 2233 Full Code [id number]  Katheren Shams, DO Inpatient   Advance Care Planning Activity    Advance Directive Documentation     Most Recent Value  Type of Advance Directive Living will, Healthcare Power of Attorney  Pre-existing out of facility DNR order (yellow form or pink MOST form) --  "MOST" Form in Place? --      TOTAL TIME TAKING CARE OF THIS PATIENT: Total time today 35 minutes.    Loletha Grayer M.D on 10/23/2020 at 11:05 AM  Between 7am to 6pm - Pager - [phone]  After 6pm go to [email or web address] - password [email or web address]  [facility]  CC: Primary care physician; [name], MD

## 2020-10-23 NOTE — Plan of Care (Signed)
Pt comfortable with prn medications this shift. Denies pain at this time.  Problem: Coping: Goal: Level of anxiety will decrease Outcome: Progressing   Problem: Pain Managment: Goal: General experience of comfort will improve Outcome: Progressing   Problem: Safety: Goal: Ability to remain free from injury will improve Outcome: Progressing

## 2020-10-23 NOTE — Progress Notes (Signed)
[facility] Liaison note:  Follow up visit to new referral for [facility] hosp[facility] home. [facility] is able to offer a bed today and patient's wife is in agreement with transfer this afternoon. Hospital care team updated. Plan is for transfer today by [facility] at 2:30 pm. Signed out of facility DNR in place in discharge packet. Liaison has called report to the hospice honm. Staff RN Bet updated. Will continue to follow and provide emotional support to patient and family thorough discharge. Flo Shanks BSN, RN, [place] [phone]

## 2020-10-23 NOTE — Progress Notes (Signed)
[place]  [phone] [phone]   Name: [name] [name] Date: 10/23/2020 MRN: [id number]  DOB: 1931-10-16  Patient Care Team: [name], MD as PCP - General (Internal Medicine) Rockey Situ Kathlene November, MD as PCP - Cardiology (Cardiology) Minna Merritts, MD as Consulting Physician (Cardiology) Telford Nab, RN as Oncology Nurse Navigator    REASON FOR CONSULTATION: [name] [name] is a 86 y.o. male with multiple medical problems including CAD, ischemic cardiomyopathy with EF of 25 to 30%, and atrial fibrillation on Xarelto. Patient had a persistent cough over a month, which did not improve with outpatient antibiotics. Patient was subsequently evaluated by cardiology and underwent CT of the chest revealing a moderately sized right pleural effusion. Patient underwent thoracentesis on 09/29/2020 yielding 1.3 L of fluid. Cytology was positive for adenocarcinoma.  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  Patient has progressively declined over the past 2 weeks with poor oral intake and presented to the ED on 10/21/2020 for evaluation.  He was found to have acute renal failure and multiple metabolic derangements.  Patient was referred to palliative care to help address goals and manage ongoing symptoms.   CODE STATUS: DNR  PAST MEDICAL HISTORY: Past Medical History:  Diagnosis Date  . Arthritis   . Asymptomatic Sinus bradycardia   . CAD (coronary artery disease)    a. 10/2005 NSTEMI/PCI: LAD 95 (2.75x33 Cypher DES); b. 2008 Ant STEMI/PCI LAD 2/2 ISR; c. 09/2011 PCI OM2; d. 02/2019 MV: Fixed mid-dist ant/apical, mid-apical inferolateral dfects w/o ischemia; e. 12/2019 Cath: LM nl, LAD 25p/m, patent stent, D2 80, LCX nl, OM2 patent stent, RCA min irregs.  . CKD (chronic kidney disease), stage III ([place])   . HFrEF  (heart failure with reduced ejection fraction) ([place])    a. 12/2019 Echo: EF 20-25%, glob HK. Mid-apical septum, inf, apical AK. Mildly reduced RV fxn. Triv MR.  Marland Kitchen Hyperlipidemia   . Hypertension   . Ischemic cardiomyopathy    a. 07/2015 Echo: EF 45-50%; b. 12/2019 Echo: EF 20-25%.  Marland Kitchen LBBB (left bundle branch block)   . Myocardial infarction ([place])   . Persistent atrial fibrillation ([place])    a.  Diagnosed January 2021.  CHA2DS2VASc = 5--> xarelto.  . Tobacco user    Remote    PAST SURGICAL HISTORY:  Past Surgical History:  Procedure Laterality Date  . CARDIAC CATHETERIZATION  10/21/2011   s/p stent placement  . CARDIAC CATHETERIZATION  2008   stent placement  . CARDIAC CATHETERIZATION  2006   stent placement  . CARDIOVERSION N/A 03/05/2020   Procedure: CARDIOVERSION;  Surgeon: Minna Merritts, MD;  Location: [facility] ORS;  Service: Cardiovascular;  Laterality: N/A;  . CARPAL TUNNEL RELEASE Right 07/22/2016   Procedure: CARPAL TUNNEL RELEASE;  Surgeon: Leanor Kail, MD;  Location: [facility] ORS;  Service: Orthopedics;  Laterality: Right;  . JOINT REPLACEMENT    . LEFT HEART CATH AND CORONARY ANGIOGRAPHY N/A 01/20/2020   Procedure: LEFT HEART CATH AND CORONARY ANGIOGRAPHY;  Surgeon: Jettie Booze, MD;  Location: [place] [facility];  Service: Cardiovascular;  Laterality: N/A;  . TOTAL HIP ARTHROPLASTY     Right  . TOTAL KNEE ARTHROPLASTY Left 06/29/2018   Procedure: LEFT TOTAL KNEE ARTHROPLASTY;  Surgeon: Frederik Pear, MD;  Location: [place];  Service: Orthopedics;  Laterality: Left;  . TRANSURETHRAL RESECTION OF PROSTATE  HEMATOLOGY/ONCOLOGY HISTORY:  Oncology History   No history exists.    ALLERGIES:  is allergic to penicillins.  MEDICATIONS:  Current Facility-Administered Medications  Medication Dose Route Frequency Provider Last Rate Last Admin  . acetaminophen (TYLENOL) tablet 650 mg  650 mg Oral Q6H PRN Howerter, Justin B, DO       Or  . acetaminophen (TYLENOL)  suppository 650 mg  650 mg Rectal Q6H PRN Howerter, Justin B, DO      . dexamethasone (DECADRON) injection 4 mg  4 mg Intravenous Q12H Kahley Leib, Kirt Boys, NP   4 mg at 10/23/20 0942  . glycopyrrolate (ROBINUL) injection 0.4 mg  0.4 mg Intravenous Q6H PRN Loletha Grayer, MD   0.4 mg at 10/23/20 0438  . HYDROmorphone (DILAUDID) injection 0.5 mg  0.5 mg Intravenous Q2H PRN Wetzel Meester, Kirt Boys, NP   0.5 mg at 10/23/20 0942  . LORazepam (ATIVAN) injection 0.5 mg  0.5 mg Intravenous Q3H PRN Howerter, Justin B, DO   0.5 mg at 10/22/20 2002  . melatonin tablet 2.5 mg  2.5 mg Oral QHS PRN Howerter, Justin B, DO      . ondansetron (ZOFRAN) injection 4 mg  4 mg Intravenous Q6H PRN Loletha Grayer, MD   4 mg at 10/22/20 1212    VITAL SIGNS: BP 128/61 (BP Location: Right Arm)   Pulse 72   Temp 98.1 F (36.7 C) (Oral)   Resp 16   Ht 5\' 6"  (1.676 m)   Wt 200 lb 9.9 oz (91 kg)   SpO2 95%   BMI 32.38 kg/m  Filed Weights   10/21/20 1230 10/22/20 0543  Weight: 200 lb 9.9 oz (91 kg) 200 lb 9.9 oz (91 kg)    Estimated body mass index is 32.38 kg/m as calculated from the following:   Height as of this encounter: 5\' 6"  (1.676 m).   Weight as of this encounter: 200 lb 9.9 oz (91 kg).  LABS: CBC:    Component Value Date/Time   WBC 9.7 10/22/2020 0639   HGB 15.1 10/22/2020 0639   HGB 14.9 02/26/2020 1206   HCT 44.8 10/22/2020 0639   HCT 43.6 02/26/2020 1206   PLT 97 (L) 10/22/2020 0639   PLT 162 02/26/2020 1206   MCV 100.0 10/22/2020 0639   MCV 101 (H) 02/26/2020 1206   NEUTROABS 5.2 10/07/2020 1205   NEUTROABS 4.1 02/26/2020 1206   LYMPHSABS 1.5 10/07/2020 1205   LYMPHSABS 2.2 02/26/2020 1206   MONOABS 0.4 10/07/2020 1205   EOSABS 0.1 10/07/2020 1205   EOSABS 0.3 02/26/2020 1206   BASOSABS 0.0 10/07/2020 1205   BASOSABS 0.0 02/26/2020 1206   Comprehensive Metabolic Panel:    Component Value Date/Time   NA 132 (L) 10/22/2020 0639   NA 136 06/26/2020 0956   K 5.9 (H) 10/22/2020 0639    CL 94 (L) 10/22/2020 0639   CO2 27 10/22/2020 0639   BUN 111 (H) 10/22/2020 0639   BUN 57 (H) 06/26/2020 0956   CREATININE 5.40 (H) 10/22/2020 0639   GLUCOSE 84 10/22/2020 0639   CALCIUM 8.2 (L) 10/22/2020 0639   AST 56 (H) 10/22/2020 0639   ALT 48 (H) 10/22/2020 0639   ALKPHOS 141 (H) 10/22/2020 0639   BILITOT 1.7 (H) 10/22/2020 0639   PROT 5.7 (L) 10/22/2020 0639   ALBUMIN 3.0 (L) 10/22/2020 0639    RADIOGRAPHIC STUDIES: CT CHEST WO CONTRAST  Result Date: 09/26/2020 CLINICAL DATA:  Productive cough. EXAM: CT CHEST WITHOUT CONTRAST TECHNIQUE: Multidetector CT imaging of  the chest was performed following the standard protocol without IV contrast. COMPARISON:  None. FINDINGS: Cardiovascular: Atherosclerosis of thoracic aorta is noted without aneurysm formation. Normal cardiac size. No pericardial effusion is noted. Coronary artery calcifications are noted. Mediastinum/Nodes: No enlarged mediastinal or axillary lymph nodes. Thyroid gland, trachea, and esophagus demonstrate no significant findings. Lungs/Pleura: No pneumothorax is noted. Emphysematous disease is noted. Left lung is clear. Moderate right pleural effusion is noted with probable adjacent right lower lobe subsegmental atelectasis. Right upper lobe airspace Passy is noted concerning for pneumonia. Upper Abdomen: No acute abnormality. Musculoskeletal: No chest wall mass or suspicious bone lesions identified. IMPRESSION: 1. Moderate right pleural effusion is noted with probable adjacent right lower lobe subsegmental atelectasis. 2. Right upper lobe airspace Passy is noted concerning for pneumonia. 3. Coronary artery calcifications are noted suggesting coronary artery disease. 4. Emphysema and aortic atherosclerosis. Aortic Atherosclerosis (ICD10-I70.0) and Emphysema (ICD10-J43.9). Electronically Signed   By: Marijo Conception M.D.   On: 09/26/2020 14:54   US Renal  Result Date: 10/21/2020 CLINICAL DATA:  Acute renal failure EXAM: RENAL /  URINARY TRACT ULTRASOUND COMPLETE COMPARISON:  04/03/2020 FINDINGS: Right Kidney: Renal measurements: 8.4 x 5.5 x 4 cm = volume: 97 mL. There is increased cortical echogenicity without evidence for hydronephrosis. There are few small cortical cysts. Left Kidney: Renal measurements: 9.8 x 5.6 x 4.4 cm = volume: 125 mL. There is no hydronephrosis. There are small cysts. There is increased cortical echogenicity. Bladder: Appears normal for degree of bladder distention. Other: None. IMPRESSION: Echogenic kidneys bilaterally which can be seen in patients with medical renal disease. No hydronephrosis. Electronically Signed   By: Constance Holster M.D.   On: 10/21/2020 18:57   NM PET Image Initial (PI) Skull Base To Thigh  Result Date: 10/21/2020 CLINICAL DATA:  Initial treatment strategy for adenocarcinoma of the RIGHT lung. EXAM: NUCLEAR MEDICINE PET SKULL BASE TO THIGH TECHNIQUE: 10.97 mCi F-18 FDG was injected intravenously. Full-ring PET imaging was performed from the skull base to thigh after the radiotracer. CT data was obtained and used for attenuation correction and anatomic localization. Fasting blood glucose: 102 mg/dl COMPARISON:  Chest CT of September 26, 2020 FINDINGS: Mediastinal blood pool activity: SUV max 3.0 Liver activity: SUV max NA NECK: Hypermetabolic LEFT supraclavicular lymph node (image 59, series 3) approximately 6 mm with SUV value of approximately 4.5 Small RIGHT level 2 lymph node (image 38, series 3) (SUVmax = 3.2) Another adjacent subcentimeter RIGHT level 2 lymph node with similar FDG uptake. Incidental CT findings: none CHEST: Extensive increased FDG uptake throughout the chest. Diffuse septal thickening in the RIGHT perihilar region involving RIGHT upper lobe and RIGHT middle lobe. Nodule in the RIGHT upper lobe on image 88 of series 3 is intensely FDG avid with an SUV of 8.9 FDG uptake is seen that is above blood pool in other areas of consolidative change in septal thickening  throughout the RIGHT chest., for instance on image 101 of series 3 consolidative areas extending anteriorly into the RIGHT upper lobe with an SUV of 5.0 RIGHT hilar adenopathy (image 99, series 3) (SUVmax = 9.0) Subcarinal adenopathy with increased FDG uptake as well (image 99, series 3) 8 mm with a maximum SUV of 7.8 Unenlarged but mildly hypermetabolic lymph nodes elsewhere in the chest. Focal hypermetabolic activity on image 104 of series 3 corresponds to the esophagus without definite visible lesion in this area, perhaps a small lymph node or small esophageal lesion with a maximum SUV of 5.9.  There is subtle change in density at the level of increased metabolic activity of uncertain significance. Scattered nonspecific LEFT axillary lymph nodes with mild increased FDG uptake. Incidental CT findings: Calcified atheromatous plaque in the thoracic aorta. Three-vessel coronary artery disease. Heart size mildly enlarged without pericardial effusion. Diffuse nodular septal thickening in the RIGHT chest and areas of consolidative change. Moderate RIGHT-sided loculated pleural fluid.  Pulmonary emphysema. Small nodule in the LEFT lung base 6 mm without definitive signs of FDG uptake, certainly not above blood pool. ABDOMEN/PELVIS: Nodular contour of the liver. Focal area of increased FDG uptake near the dome of the RIGHT hemi liver (image 31, series 3) (SUVmax = 8.8) Diffuse increased FDG uptake throughout the nodular appearing LEFT hepatic lobe without clear signs of discrete mass on noncontrast imaging. Similar pattern in the RIGHT hepatic lobe posteriorly mainly hepatic subsegment VII and VI also without discrete focal hepatic lesion aside from a single lesion on image 157 of series 3. Note that for reference what appears to represent normal background hepatic activity is 4.8 with 10.2 maximum SUV in posterior RIGHT hepatic lobe and similar FDG uptake in the LEFT hepatic lobe. LEFT adrenal nodularity with intense FDG  uptake, nodularity on the RIGHT with less FDG uptake. LEFT adrenal lesion approximately 13 mm with a maximum SUV of 6.8 no adenopathy in the abdomen with increased metabolic activity. No adenopathy in the pelvis. Incidental CT findings: Cystic renal lesions and lesions of varying complexity of the bilateral kidneys without signs of focal, suspicious FDG uptake. Mild parenchymal thinning. No hydronephrosis. No pericholecystic stranding. Normal spleen. Normal pancreas. Calcified atheromatous plaque in the abdominal aorta. No aneurysmal dilation. Prostate grossly unremarkable though obscured by streak artifact from bilateral hip arthroplasty changes. SKELETON: Diffuse bony metastatic disease. Multifocal lesions in bilateral ribs. Lesions at multiple levels throughout the spine with involvement of T1 through T12 mainly in anterior elements. Posterior elements at T5 and T6 are involved in also at T12. All lumbar levels with varying degrees of involvement with diffuse involvement of L2, L3 and in particular. Diffuse involvement of the body of the sternum with a maximum SUV of 8.1. T10 with maximum SUV of 8.2. L2 with a maximum SUV of 8.9 Diffuse central upper and RIGHT sacral involvement (image 212) on fused dataset. Subtle heterogeneity in this location without discrete lesion. Maximum SUV of approximately 8.2. Scattered lesions throughout the remainder of the pelvis, LEFT iliac along the LEFT iliac wing and RIGHT posterior iliac bone. Many of the above lesions display subtly lytic characteristics or subtly sclerotic characteristics but many are nearly occult from a radiographic perspective on CT images. Chronic appearing L1 compression deformity. Incidental CT findings: Multilevel spinal degenerative changes. IMPRESSION: 1. Findings that are suspicious for pulmonary neoplasm with lymphangitic carcinomatosis, neck and chest adenopathy, diffuse bony metastatic disease and metastatic disease to the adrenals and potentially  liver. 2. Areas of diffuse uptake without discrete lesion but with a single lesion that can be visualized on CT images in the RIGHT hepatic lobe. Infiltrative neoplasm is considered. Consider follow-up MRI of the liver for further assessment. Nodularity of the liver also raising the question of background liver disease. 3. Focal area of increased uptake in the esophagus may represent a small lymph node adjacent to the esophagus. There is subtle increase in soft tissue without definite lesion in this location. Dedicated esophageal evaluation may be helpful as warranted. 4. C2 with increased FDG uptake, also some FDG uptake associated with degenerative changes in the middle  lower cervical spine maximum SUV at C2 is 3.9 These results will be called to the ordering clinician or representative by the Radiologist Assistant, and communication documented in the PACS or [employer]. Electronically Signed   By: Zetta Bills M.D.   On: 10/21/2020 15:10   DG Chest Port 1 View  Result Date: 10/15/2020 CLINICAL DATA:  Status post right thoracentesis. EXAM: PORTABLE CHEST 1 VIEW COMPARISON:  09/29/2020 FINDINGS: No pneumothorax after right thoracentesis. No significant residual pleural fluid by x-ray. Persistent opacity in the right perihilar lung is suspicious for malignancy. No edema. IMPRESSION: No pneumothorax after right thoracentesis. No significant residual right pleural fluid by x-ray. Persistent right perihilar lung opacity suspicious for malignancy. Electronically Signed   By: Aletta Edouard M.D.   On: 10/15/2020 10:13   DG Chest Port 1 View  Result Date: 09/29/2020 CLINICAL DATA:  Status post thoracentesis EXAM: PORTABLE CHEST 1 VIEW COMPARISON:  Chest CT September 25, 2020 FINDINGS: No pneumothorax. Essentially complete resolution of pleural effusion after thoracentesis. Airspace consolidation noted in the right mid lung, essentially stable compared to recent study. Left lung clear. Heart is borderline  enlarged with pulmonary vascularity normal. No adenopathy. No bone lesions. IMPRESSION: No pneumothorax with resolution of pleural effusion following thoracentesis. Consolidation right mid lung, most likely due to pneumonia. An obstructing bronchial lesion could present in this manner. Serial evaluation to clearing advised. Lungs elsewhere clear. Heart borderline enlarged with pulmonary vascularity normal. No adenopathy. Followup PA and lateral chest radiographs recommended in 3-4 weeks following trial of antibiotic therapy to ensure resolution and exclude underlying malignancy. Electronically Signed   By: Lowella Grip III M.D.   On: 09/29/2020 14:48   DG Abdomen Acute W/Chest  Result Date: 10/21/2020 CLINICAL DATA:  86 year old male with weakness, nausea vomiting. EXAM: DG ABDOMEN ACUTE WITH 1 VIEW CHEST COMPARISON:  Chest radiograph dated 10/15/2020. FINDINGS: Small right pleural effusion. Right perihilar streaky densities again noted. No new consolidation. There is background of emphysema. No pneumothorax. The cardiac silhouette is within limits. There is moderate stool throughout the colon. No bowel dilatation or evidence of obstruction. No free air or radiopaque calculi. Degenerative changes of the spine. Bilateral hip arthroplasties. No acute osseous pathology. IMPRESSION: 1. Right perihilar density as seen on the earlier radiograph. No pneumothorax. 2. Small right pleural effusion. 3. Emphysema. 4. Constipation.  No bowel obstruction. Electronically Signed   By: Anner Crete M.D.   On: 10/21/2020 19:01   US THORACENTESIS ASP PLEURAL SPACE W/IMG GUIDE  Result Date: 10/15/2020 CLINICAL DATA:  Recurrent right pleural effusion with prior thoracentesis on 09/29/2020. Cytology revealed adenocarcinoma in pleural fluid. EXAM: ULTRASOUND GUIDED RIGHT THORACENTESIS COMPARISON:  None. PROCEDURE: An ultrasound guided thoracentesis was thoroughly discussed with the patient and questions answered. The  benefits, risks, alternatives and complications were also discussed. The patient understands and wishes to proceed with the procedure. Written consent was obtained. A time-out was performed prior to initiating the procedure. Ultrasound was performed to localize and mark an adequate pocket of fluid in the right chest. The area was then prepped and draped in the normal sterile fashion. 1% Lidocaine was used for local anesthesia. Under ultrasound guidance a 6 French Safe-T-Centesis catheter was introduced. Thoracentesis was performed. The catheter was removed and a dressing applied. COMPLICATIONS: None FINDINGS: A total of approximately 1.3 L of serosanguineous fluid was removed. A fluid sample was sent for laboratory analysis. IMPRESSION: Successful ultrasound guided right thoracentesis yielding 1.3 L of pleural fluid. Electronically Signed  By: Aletta Edouard M.D.   On: 10/15/2020 10:10   US THORACENTESIS ASP PLEURAL SPACE W/IMG GUIDE  Result Date: 09/29/2020 INDICATION: Shortness of breath. Large right pleural effusion. Recent history of pneumonia. Request diagnostic and therapeutic thoracentesis. EXAM: ULTRASOUND GUIDED RIGHT THORACENTESIS MEDICATIONS: 1% plain lidocaine, 10 mL COMPLICATIONS: None immediate. Postprocedural chest x-ray negative for pneumothorax PROCEDURE: An ultrasound guided thoracentesis was thoroughly discussed with the patient and questions answered. The benefits, risks, alternatives and complications were also discussed. The patient understands and wishes to proceed with the procedure. Written consent was obtained. Ultrasound was performed to localize and mark an adequate pocket of fluid in the right chest. The area was then prepped and draped in the normal sterile fashion. 1% Lidocaine was used for local anesthesia. Under ultrasound guidance a 6 Fr Safe-T-Centesis catheter was introduced. Thoracentesis was performed. The catheter was removed and a dressing applied. FINDINGS: A total of  approximately 1.3 L of clear, dark yellow fluid was removed. Samples were sent to the laboratory as requested by the clinical team. IMPRESSION: Successful ultrasound guided right thoracentesis yielding 1.3 L of pleural fluid. Read by: Ascencion Dike PA-C Electronically Signed   By: Marcello Moores  Register   On: 09/29/2020 14:53    PERFORMANCE STATUS (ECOG) : 4 - Bedbound  Review of Systems Unless otherwise noted, a complete review of systems is negative.  Physical Exam General: Ill-appearing Pulmonary: Labored when coughing, on O2 Abdomen: soft, nontender, + bowel sounds GU: no suprapubic tenderness Extremities: no edema, no joint deformities Skin: no rashes Neurological: Weakness but otherwise nonfocal  IMPRESSION: Follow-up visit.  No family present.  Patient is alert and oriented.  He is having frequent coughing spells with subsequent prolonged labored breathing and dyspnea.  Spoke with RN.  We will have her give a dose of hydromorphone for comfort.  Would liberalize frequency in which she is receiving hydromorphone to ensure that he remains comfortable.  Patient is awaiting hospice home transfer when a bed is available.  PLAN: -Continue comfort measures -Await hospice home placement  Case and plan discussed with hospice liaison   Time Total: 20 minutes  Visit consisted of counseling and education dealing with the complex and emotionally intense issues of symptom management and palliative care in the setting of serious and potentially life-threatening illness.Greater than 50%  of this time was spent counseling and coordinating care related to the above assessment and plan.  Signed by: Altha Harm, PhD, NP-C

## 2020-10-23 NOTE — TOC Transition Note (Signed)
Transition of Care [facility]) - CM/SW Discharge Note   Patient Details  Name: [name] [name] MRN: [id number] Date of Birth: 13-Mar-1931  [facility]) CM/SW Contact:  Magnus Ivan, LCSW Phone Number: 10/23/2020, 10:40 AM   Clinical Narrative:   Patient to discharge to [facility] in [place], [place] today. Patient's wife is aware and has been speaking with Nature conservation officer Santiago Glad for updates. EMS Paperwork, Face Sheet, and DNR placed in Discharge Packet by chart. EMS transport arranged for 2:45 pm per Hospice request with [facility].     Final next level of care: [place] Barriers to Discharge: Barriers Resolved   Patient Goals and CMS Choice Patient states their goals for this hospitalization and ongoing recovery are:: [place] CMS Medicare.gov Compare Post Acute Care list provided to:: Patient Represenative (must comment) Choice offered to / list presented to : Spouse  Discharge Placement              Patient chooses bed at:  [facility]) Patient to be transferred to facility by: [facility] Name of family member notified: Wife notified by Lonia Chimera Rep Santiago Glad Patient and family notified of of transfer: 10/23/20  Discharge Plan and Services In-house Referral: Clinical Social Work   Post Acute Care Choice: Hospice (Followed by [facility])                               Social Determinants of Health (SDOH) Interventions     Readmission Risk Interventions No flowsheet data found.

## 2020-10-23 NOTE — Care Management Important Message (Signed)
Important Message  Patient Details  Name: GAMAL TODISCO MRN: [id number] Date of Birth: [birth year]   Medicare Important Message Given:  Other (see comment)  Patient on Comfort Measures and out of the respect for the patient and family no Important Message from [facility] given.  Juliann Pulse A Gilmer Kaminsky 10/23/2020, 8:43 AM

## 2020-10-23 NOTE — Progress Notes (Signed)
Patient ID: [name] [name], male   DOB: 30-Jan-1931, 86 y.o.   MRN: [id number] [facility] PROGRESS NOTE  LAW CORSINO WCB:[id number] DOB: 06-22-1931 DOA: 10/21/2020 PCP: [name], MD  HPI/Subjective: As per wife, patient did not get much rest last night.  Even this morning after drinking a sip of water he started coughing.  Advised that he can use it to wet his mouth but spit it out afterwards.  Patient came in with shortness of breath.  Made comfort care with metastatic cancer.  Objective: Vitals:   10/22/20 1220 10/22/20 1756  BP: (!) 105/59 128/61  Pulse: 85 72  Resp: 19 16  Temp:    SpO2: 91% 95%    Intake/Output Summary (Last 24 hours) at 10/23/2020 0750 Last data filed at 10/23/2020 0644 Gross per 24 hour  Intake 50.91 ml  Output 150 ml  Net -99.09 ml   Filed Weights   10/21/20 1230 10/22/20 0543  Weight: 91 kg 91 kg    ROS: Review of Systems  Respiratory: Positive for cough and shortness of breath.   Cardiovascular: Negative for chest pain.  Gastrointestinal: Negative for abdominal pain.   Exam: Physical Exam HENT:     Head: Normocephalic.     Mouth/Throat:     Pharynx: No oropharyngeal exudate.  Eyes:     General: Lids are normal.     Conjunctiva/sclera: Conjunctivae normal.     Pupils: Pupils are equal, round, and reactive to light.  Cardiovascular:     Rate and Rhythm: Normal rate and regular rhythm.     Heart sounds: Normal heart sounds, S1 normal and S2 normal.  Pulmonary:     Breath sounds: Examination of the right-lower field reveals decreased breath sounds. Examination of the left-lower field reveals decreased breath sounds. Decreased breath sounds present. No wheezing, rhonchi or rales.  Abdominal:     Palpations: Abdomen is soft.     Tenderness: There is no abdominal tenderness.  Musculoskeletal:     Right lower leg: Swelling present.     Left lower leg: Swelling present.  Skin:    General: Skin is warm.     Comments:  Chronic lower extremity skin discoloration  Neurological:     Mental Status: He is alert.     Comments: Answers some questions       Data Reviewed: Basic Metabolic Panel: Recent Labs  Lab 10/21/20 1236 10/22/20 0639  NA 131* 132*  K 5.6* 5.9*  CL 95* 94*  CO2 23 27  GLUCOSE 130* 84  BUN 103* 111*  CREATININE 5.09* 5.40*  CALCIUM 8.7* 8.2*  MG  --  3.5*  PHOS  --  6.3*   Liver Function Tests: Recent Labs  Lab 10/22/20 0639  AST 56*  ALT 48*  ALKPHOS 141*  BILITOT 1.7*  PROT 5.7*  ALBUMIN 3.0*   CBC: Recent Labs  Lab 10/21/20 1236 10/22/20 0639  WBC 9.6 9.7  HGB 16.3 15.1  HCT 49.0 44.8  MCV 99.8 100.0  PLT 110* 97*   Cardiac Enzymes: Recent Labs  Lab 10/22/20 0639  CKTOTAL 80   BNP (last 3 results) Recent Labs    06/26/20 0956 10/22/20 0639  BNP 88.0 219.9*    CBG: Recent Labs  Lab 10/21/20 0954  GLUCAP 102*    Recent Results (from the past 240 hour(s))  SARS CORONAVIRUS 2 (TAT 6-24 HRS) Nasopharyngeal Nasopharyngeal Swab     Status: None   Collection Time: 10/14/20 12:28 PM  Specimen: Nasopharyngeal Swab  Result Value Ref Range Status   SARS Coronavirus 2 NEGATIVE NEGATIVE Final    Comment: (NOTE) SARS-CoV-2 target nucleic acids are NOT DETECTED.  The SARS-CoV-2 RNA is generally detectable in upper and lower respiratory specimens during the acute phase of infection. Negative results do not preclude SARS-CoV-2 infection, do not rule out co-infections with other pathogens, and should not be used as the sole basis for treatment or other patient management decisions. Negative results must be combined with clinical observations, patient history, and epidemiological information. The expected result is Negative.  Fact Sheet for Patients: SugarRoll.be  Fact Sheet for Healthcare Providers: https://www.woods-mathews.com/  This test is not yet approved or cleared by the [place] FDA and   has been authorized for detection and/or diagnosis of SARS-CoV-2 by FDA under an Emergency Use Authorization (EUA). This EUA will remain  in effect (meaning this test can be used) for the duration of the COVID-19 declaration under Se ction 564(b)(1) of the Act, 21 U.S.C. section 360bbb-3(b)(1), unless the authorization is terminated or revoked sooner.  Performed at [facility], [place] [address] [place], [place] [address]   Body fluid culture     Status: None   Collection Time: 10/15/20  9:45 AM   Specimen: PATH Cytology Pleural fluid  Result Value Ref Range Status   Specimen Description   Final    PLEURAL Performed at [facility], [address] [place], [place] [address]    Special Requests   Final    NONE Performed at [facility], [address] [place], [place] [address]    Gram Stain   Final    RARE WBC PRESENT, PREDOMINANTLY MONONUCLEAR NO ORGANISMS SEEN    Culture   Final    NO GROWTH 3 DAYS Performed at [facility], [place] [address] [place], [place] [address]    Report Status 10/18/2020 FINAL  Final  Fungus Culture With Stain     Status: None (Preliminary result)   Collection Time: 10/15/20  9:45 AM   Specimen: PATH Cytology Pleural fluid  Result Value Ref Range Status   Fungus Stain Final report  Final    Comment: (NOTE) Performed At: [facility] [place], [place] [id number] Rush Farmer MD [id number]    Fungus (Mycology) Culture PENDING  Incomplete   Fungal Source PLEURAL  Final    Comment: Performed at [facility], [address] [place], [place] [address]  Acid Fast Smear (AFB)     Status: None   Collection Time: 10/15/20  9:45 AM   Specimen: PATH Cytology Pleural fluid  Result Value Ref Range Status   AFB Specimen Processing Concentration  Final   Acid Fast Smear Negative  Final    Comment: (NOTE) Performed At: [facility] [address] [place], [place]  [id number] Rush Farmer MD [id number]    Source (AFB) PLEURAL  Final    Comment: Performed at [facility], [address] [place], [place] [address]  Fungus Culture Result     Status: None   Collection Time: 10/15/20  9:45 AM  Result Value Ref Range Status   Result 1 Comment  Final    Comment: (NOTE) KOH/Calcofluor preparation:  no fungus observed. Performed At: [facility] [place], [place] [id number] Rush Farmer MD [id number]   Respiratory Panel by RT PCR (Flu A&B, Covid) - Nasopharyngeal Swab     Status: None   Collection Time: 10/21/20 10:23  PM   Specimen: Nasopharyngeal Swab  Result Value Ref Range Status   SARS Coronavirus 2 by RT PCR NEGATIVE NEGATIVE Final    Comment: (NOTE) SARS-CoV-2 target nucleic acids are NOT DETECTED.  The SARS-CoV-2 RNA is generally detectable in upper respiratoy specimens during the acute phase of infection. The lowest concentration of SARS-CoV-2 viral copies this assay can detect is 131 copies/mL. A negative result does not preclude SARS-Cov-2 infection and should not be used as the sole basis for treatment or other patient management decisions. A negative result may occur with  improper specimen collection/handling, submission of specimen other than nasopharyngeal swab, presence of viral mutation(s) within the areas targeted by this assay, and inadequate number of viral copies (<131 copies/mL). A negative result must be combined with clinical observations, patient history, and epidemiological information. The expected result is Negative.  Fact Sheet for Patients:  PinkCheek.be  Fact Sheet for Healthcare Providers:  GravelBags.it  This test is no t yet approved or cleared by the [place] FDA and  has been authorized for detection and/or diagnosis of SARS-CoV-2 by FDA under an Emergency Use Authorization (EUA). This EUA will remain   in effect (meaning this test can be used) for the duration of the COVID-19 declaration under Section 564(b)(1) of the Act, 21 U.S.C. section 360bbb-3(b)(1), unless the authorization is terminated or revoked sooner.     Influenza A by PCR NEGATIVE NEGATIVE Final   Influenza B by PCR NEGATIVE NEGATIVE Final    Comment: (NOTE) The Xpert Xpress SARS-CoV-2/FLU/RSV assay is intended as an aid in  the diagnosis of influenza from Nasopharyngeal swab specimens and  should not be used as a sole basis for treatment. Nasal washings and  aspirates are unacceptable for Xpert Xpress SARS-CoV-2/FLU/RSV  testing.  Fact Sheet for Patients: PinkCheek.be  Fact Sheet for Healthcare Providers: GravelBags.it  This test is not yet approved or cleared by the [place] FDA and  has been authorized for detection and/or diagnosis of SARS-CoV-2 by  FDA under an Emergency Use Authorization (EUA). This EUA will remain  in effect (meaning this test can be used) for the duration of the  Covid-19 declaration under Section 564(b)(1) of the Act, 21  U.S.C. section 360bbb-3(b)(1), unless the authorization is  terminated or revoked. Performed at [facility], [address] [place], [place] [address]      Studies: US Renal  Result Date: 10/21/2020 CLINICAL DATA:  Acute renal failure EXAM: RENAL / URINARY TRACT ULTRASOUND COMPLETE COMPARISON:  04/03/2020 FINDINGS: Right Kidney: Renal measurements: 8.4 x 5.5 x 4 cm = volume: 97 mL. There is increased cortical echogenicity without evidence for hydronephrosis. There are few small cortical cysts. Left Kidney: Renal measurements: 9.8 x 5.6 x 4.4 cm = volume: 125 mL. There is no hydronephrosis. There are small cysts. There is increased cortical echogenicity. Bladder: Appears normal for degree of bladder distention. Other: None. IMPRESSION: Echogenic kidneys bilaterally which can be seen in patients  with medical renal disease. No hydronephrosis. Electronically Signed   By: Constance Holster M.D.   On: 10/21/2020 18:57   NM PET Image Initial (PI) Skull Base To Thigh  Result Date: 10/21/2020 CLINICAL DATA:  Initial treatment strategy for adenocarcinoma of the RIGHT lung. EXAM: NUCLEAR MEDICINE PET SKULL BASE TO THIGH TECHNIQUE: 10.97 mCi F-18 FDG was injected intravenously. Full-ring PET imaging was performed from the skull base to thigh after the radiotracer. CT data was obtained and used for attenuation correction and anatomic localization. Fasting blood glucose:  102 mg/dl COMPARISON:  Chest CT of September 26, 2020 FINDINGS: Mediastinal blood pool activity: SUV max 3.0 Liver activity: SUV max NA NECK: Hypermetabolic LEFT supraclavicular lymph node (image 59, series 3) approximately 6 mm with SUV value of approximately 4.5 Small RIGHT level 2 lymph node (image 38, series 3) (SUVmax = 3.2) Another adjacent subcentimeter RIGHT level 2 lymph node with similar FDG uptake. Incidental CT findings: none CHEST: Extensive increased FDG uptake throughout the chest. Diffuse septal thickening in the RIGHT perihilar region involving RIGHT upper lobe and RIGHT middle lobe. Nodule in the RIGHT upper lobe on image 88 of series 3 is intensely FDG avid with an SUV of 8.9 FDG uptake is seen that is above blood pool in other areas of consolidative change in septal thickening throughout the RIGHT chest., for instance on image 101 of series 3 consolidative areas extending anteriorly into the RIGHT upper lobe with an SUV of 5.0 RIGHT hilar adenopathy (image 99, series 3) (SUVmax = 9.0) Subcarinal adenopathy with increased FDG uptake as well (image 99, series 3) 8 mm with a maximum SUV of 7.8 Unenlarged but mildly hypermetabolic lymph nodes elsewhere in the chest. Focal hypermetabolic activity on image 104 of series 3 corresponds to the esophagus without definite visible lesion in this area, perhaps a small lymph node or small  esophageal lesion with a maximum SUV of 5.9. There is subtle change in density at the level of increased metabolic activity of uncertain significance. Scattered nonspecific LEFT axillary lymph nodes with mild increased FDG uptake. Incidental CT findings: Calcified atheromatous plaque in the thoracic aorta. Three-vessel coronary artery disease. Heart size mildly enlarged without pericardial effusion. Diffuse nodular septal thickening in the RIGHT chest and areas of consolidative change. Moderate RIGHT-sided loculated pleural fluid.  Pulmonary emphysema. Small nodule in the LEFT lung base 6 mm without definitive signs of FDG uptake, certainly not above blood pool. ABDOMEN/PELVIS: Nodular contour of the liver. Focal area of increased FDG uptake near the dome of the RIGHT hemi liver (image 31, series 3) (SUVmax = 8.8) Diffuse increased FDG uptake throughout the nodular appearing LEFT hepatic lobe without clear signs of discrete mass on noncontrast imaging. Similar pattern in the RIGHT hepatic lobe posteriorly mainly hepatic subsegment VII and VI also without discrete focal hepatic lesion aside from a single lesion on image 157 of series 3. Note that for reference what appears to represent normal background hepatic activity is 4.8 with 10.2 maximum SUV in posterior RIGHT hepatic lobe and similar FDG uptake in the LEFT hepatic lobe. LEFT adrenal nodularity with intense FDG uptake, nodularity on the RIGHT with less FDG uptake. LEFT adrenal lesion approximately 13 mm with a maximum SUV of 6.8 no adenopathy in the abdomen with increased metabolic activity. No adenopathy in the pelvis. Incidental CT findings: Cystic renal lesions and lesions of varying complexity of the bilateral kidneys without signs of focal, suspicious FDG uptake. Mild parenchymal thinning. No hydronephrosis. No pericholecystic stranding. Normal spleen. Normal pancreas. Calcified atheromatous plaque in the abdominal aorta. No aneurysmal dilation. Prostate  grossly unremarkable though obscured by streak artifact from bilateral hip arthroplasty changes. SKELETON: Diffuse bony metastatic disease. Multifocal lesions in bilateral ribs. Lesions at multiple levels throughout the spine with involvement of T1 through T12 mainly in anterior elements. Posterior elements at T5 and T6 are involved in also at T12. All lumbar levels with varying degrees of involvement with diffuse involvement of L2, L3 and in particular. Diffuse involvement of the body of the sternum with a  maximum SUV of 8.1. T10 with maximum SUV of 8.2. L2 with a maximum SUV of 8.9 Diffuse central upper and RIGHT sacral involvement (image 212) on fused dataset. Subtle heterogeneity in this location without discrete lesion. Maximum SUV of approximately 8.2. Scattered lesions throughout the remainder of the pelvis, LEFT iliac along the LEFT iliac wing and RIGHT posterior iliac bone. Many of the above lesions display subtly lytic characteristics or subtly sclerotic characteristics but many are nearly occult from a radiographic perspective on CT images. Chronic appearing L1 compression deformity. Incidental CT findings: Multilevel spinal degenerative changes. IMPRESSION: 1. Findings that are suspicious for pulmonary neoplasm with lymphangitic carcinomatosis, neck and chest adenopathy, diffuse bony metastatic disease and metastatic disease to the adrenals and potentially liver. 2. Areas of diffuse uptake without discrete lesion but with a single lesion that can be visualized on CT images in the RIGHT hepatic lobe. Infiltrative neoplasm is considered. Consider follow-up MRI of the liver for further assessment. Nodularity of the liver also raising the question of background liver disease. 3. Focal area of increased uptake in the esophagus may represent a small lymph node adjacent to the esophagus. There is subtle increase in soft tissue without definite lesion in this location. Dedicated esophageal evaluation may be  helpful as warranted. 4. C2 with increased FDG uptake, also some FDG uptake associated with degenerative changes in the middle lower cervical spine maximum SUV at C2 is 3.9 These results will be called to the ordering clinician or representative by the Radiologist Assistant, and communication documented in the PACS or [employer]. Electronically Signed   By: Zetta Bills M.D.   On: 10/21/2020 15:10   DG Abdomen Acute W/Chest  Result Date: 10/21/2020 CLINICAL DATA:  86 year old male with weakness, nausea vomiting. EXAM: DG ABDOMEN ACUTE WITH 1 VIEW CHEST COMPARISON:  Chest radiograph dated 10/15/2020. FINDINGS: Small right pleural effusion. Right perihilar streaky densities again noted. No new consolidation. There is background of emphysema. No pneumothorax. The cardiac silhouette is within limits. There is moderate stool throughout the colon. No bowel dilatation or evidence of obstruction. No free air or radiopaque calculi. Degenerative changes of the spine. Bilateral hip arthroplasties. No acute osseous pathology. IMPRESSION: 1. Right perihilar density as seen on the earlier radiograph. No pneumothorax. 2. Small right pleural effusion. 3. Emphysema. 4. Constipation.  No bowel obstruction. Electronically Signed   By: Anner Crete M.D.   On: 10/21/2020 19:01    Scheduled Meds:  dexamethasone  4 mg Intravenous Q12H    Assessment/Plan:  1. End-of-life care.  Comfort care measures.  Patient is a DO NOT RESUSCITATE.  On waiting list for hospice home.  Continue as needed Dilaudid, as needed glycopyrrolate and Ativan. 2. Acute kidney injury on chronic kidney disease stage IV.  No further lab draws. 3. Severe hyperkalemia.  No further lab draws.  Treated with short acting medications yesterday. 4. Metastatic lung cancer 5. Paroxysmal atrial fibrillation 6. Hyponatremia. 7. Thrombocytopenia. 8. Chronic systolic congestive heart failure 9. Dysphagia    Code Status:     Code Status  Orders  (From admission, onward)         Start     Ordered   10/21/20 2151  Do not attempt resuscitation (DNR)  Continuous       Question Answer Comment  In the event of cardiac or respiratory ARREST Do not call a code blue   In the event of cardiac or respiratory ARREST Do not perform Intubation, CPR, defibrillation or ACLS  In the event of cardiac or respiratory ARREST Use medication by any route, position, wound care, and other measures to relive pain and suffering. May use oxygen, suction and manual treatment of airway obstruction as needed for comfort.      10/21/20 2152        Code Status History    Date Active Date Inactive Code Status Order ID Comments User Context   10/21/2020 2141 10/21/2020 2152 DNR [id number]  Rhetta Mura, DO ED   01/19/2020 1941 01/21/2020 1551 Full Code [id number]  Jani Gravel, MD ED   06/29/2018 1201 06/30/2018 2019 Full Code [id number]  Leighton Parody, PA-C Inpatient   08/03/2015 1632 08/04/2015 2233 Full Code [id number]  Katheren Shams, DO Inpatient   Advance Care Planning Activity    Advance Directive Documentation     Most Recent Value  Type of Advance Directive Living will, Healthcare Power of Attorney  Pre-existing out of facility DNR order (yellow form or pink MOST form) --  "MOST" Form in Place? --     Family Communication: Wife at the bedside Disposition Plan: Status is: Inpatient  Dispo: The patient is from: Home              Anticipated d/c is to: Hospice home              Anticipated d/c date is: Hospice home when bed available              Patient currently receiving comfort care measures here in the hospital.  Time spent: 24 minutes now  [employer]

## 2020-10-26 DEATH — deceased

## 2020-11-01 NOTE — Progress Notes (Deleted)
Cardiology Office Note  Date:  11/01/2020   ID:  [name] [name], DOB 09-14-1931, MRN [id number]  PCP:  [name], MD   No chief complaint on file.   HPI:  [name] is a pleasant 86 y.o. gentleman with a history of  Smoking, stopped in 1969 coronary artery disease,  prior stenting to his mid LAD,  in 2006,  anterior ST elevation MI December 2008 secondary to ISR hypertension,  hyperlipidemia,  chronic kidney disease stage III admitted to the hospital October 21, 2011 with chest pain cardiac cardiac catheterization  with PCI of the OM 2.,  Ejection fraction 45 to 50% Chest pain shortness of breath January 2021, ejection fraction at that time 20 to 25%  catheterization January 20 2020 with nonobstructive disease He presents today for follow-up of his coronary artery disease, atrial fib  Recently seen in clinic September 29, 2020 Weight loss, anorexia, some nausea, difficulty swallowing Left-sided pleural effusion with shortness of breath, thoracentesis was arranged Cytology concerning for adenocarcinoma compatible with lung origin. He was seen by oncology October 13 PET scan and MRI brain was ordered It was felt he might not be a good candidate for aggressive chemotherapy  -PET scan October 21, 2020 concerning for pulmonary neoplasm with metastases neck, chest adenopathy, diffuse bony metastatic disease, adrenals, potentially liver  Was seen in the emergency room for weakness nausea vomiting acute on chronic renal failure Was very drowsy per the notes, significant deterioration from several weeks earlier per oncology -Creatinine up to 5.4 with BUN 111 Discharged to hospice home  Remains in atrial fibrillation today, Is on amiodarone daily  EKG personally reviewed by myself on todays visit Shows atrial fibrillation, left bundle branch block rate 82 bpm  Past medical history reviewed Seen in the hospital for chest pain shortness of breath January 2021, Cath 12/2019   nonobstructive disease Ejection fraction 20 to 25%, Noted to be in atrial fibrillation    Cardioversion 3/11 as outpatient Repeat echocardiogram April 06, 2020 still ejection fraction 25-30 % He was in normal sinus rhythm rates in the high 40s at the time of his echo  Presented to the ED on 03/06/2019 after seeing Dr. Ramonita Lab and his troponin lab showed 0.03. Repeat again 0.03 The ED did not find anything of note  And he was discharged home  For further work-up, he had a stress test on 03/08/2019 This showed predominantly fixed defects in regions of old MI  Results discussed with him in detail today  Ejection fraction 34%   echocardiogram  [facility] ejection fraction 35% presumably but not specifically detailed.  Moderate AI   OTHER PAST MEDICAL HISTORY REVIEWED BY ME FOR TODAY'S VISIT: Left total knee replacement surgery in July 2019.   Hospital admission 08/04/2015 for GI discomfort, chest pain Negative cardiac enzymes,  Acute on chronic bronchitis, treated with ABX  Cough with ACE inhibitors  Echocardiogram 08/04/2015 showing ejection fraction 45-50% with apical hypokinesis  Stress test at the end of August 2016 showing fixed defect, no ischemia, old anterior MI   Prior cardiac catheterization October 2012 showed left main shows no disease, LAD has 30% proximal disease, 2 overlapping stents in the mid region, 95% ostial diagonal disease jailed by the stents, 95% proximal OM 2 disease, mild plaque in the RCA, hypokinesis of the anterolateral wall, apical region and inferoapical wall with ejection fraction 30-35%.   PMH:   has a past medical history of Arthritis, Asymptomatic Sinus bradycardia, CAD (coronary artery disease),  CKD (chronic kidney disease), stage III (HCC), HFrEF (heart failure with reduced ejection fraction) ([place]), Hyperlipidemia, Hypertension, Ischemic cardiomyopathy, LBBB (left bundle branch block), Myocardial infarction [facility]), Persistent atrial fibrillation  ([place]), and Tobacco user.  PSH:    Past Surgical History:  Procedure Laterality Date  . CARDIAC CATHETERIZATION  10/21/2011   s/p stent placement  . CARDIAC CATHETERIZATION  2008   stent placement  . CARDIAC CATHETERIZATION  2006   stent placement  . CARDIOVERSION N/A 03/05/2020   Procedure: CARDIOVERSION;  Surgeon: Minna Merritts, MD;  Location: [facility];  Service: Cardiovascular;  Laterality: N/A;  . CARPAL TUNNEL RELEASE Right 07/22/2016   Procedure: CARPAL TUNNEL RELEASE;  Surgeon: Leanor Kail, MD;  Location: [facility];  Service: Orthopedics;  Laterality: Right;  . JOINT REPLACEMENT    . LEFT HEART CATH AND CORONARY ANGIOGRAPHY N/A 01/20/2020   Procedure: LEFT HEART CATH AND CORONARY ANGIOGRAPHY;  Surgeon: Jettie Booze, MD;  Location: [facility];  Service: Cardiovascular;  Laterality: N/A;  . TOTAL HIP ARTHROPLASTY     Right  . TOTAL KNEE ARTHROPLASTY Left 06/29/2018   Procedure: LEFT TOTAL KNEE ARTHROPLASTY;  Surgeon: Frederik Pear, MD;  Location: [facility];  Service: Orthopedics;  Laterality: Left;  . TRANSURETHRAL RESECTION OF PROSTATE      Current Outpatient Medications  Medication Sig Dispense Refill  . acetaminophen (TYLENOL) 325 MG tablet Take 2 tablets (650 mg total) by mouth every 6 (six) hours as needed for mild pain (or Fever >/= 101).    Marland Kitchen acetaminophen (TYLENOL) 650 MG suppository Place 1 suppository (650 mg total) rectally every 6 (six) hours as needed for mild pain (or Fever >/= 101). 12 suppository 0  . dexamethasone (DECADRON) 4 MG/ML injection Inject 1 mL (4 mg total) into the vein every 12 (twelve) hours. 1 mL   . glycopyrrolate (ROBINUL) 0.2 MG/ML injection Inject 2 mLs (0.4 mg total) into the vein every 6 (six) hours as needed (secretions). 1 mL   . HYDROmorphone (DILAUDID) 1 MG/ML injection Inject 0.5 mLs (0.5 mg total) into the vein every 2 (two) hours as needed for moderate pain (or dyspnea). 1 mL 0  . LORazepam (ATIVAN) 2 MG/ML injection Inject  0.25 mLs (0.5 mg total) into the vein every 3 (three) hours as needed (nausea or anxiety). 1 mL 0  . melatonin 5 MG TABS Take 0.5 tablets (2.5 mg total) by mouth at bedtime as needed (insomnia).  0  . ondansetron (ZOFRAN) 4 MG/2ML SOLN injection Inject 2 mLs (4 mg total) into the vein every 6 (six) hours as needed for nausea or vomiting. 2 mL 0   No current facility-administered medications for this visit.    Allergies:   Penicillins   Social History:  The patient  reports that he quit smoking about 51 years ago. His smoking use included cigarettes. He has a 20.00 pack-year smoking history. He has never used smokeless tobacco. He reports that he does not drink alcohol and does not use drugs.   Family History:   family history includes Heart disease in his father; Ovarian cancer in his sister; Parkinson's disease in his mother.   Review of Systems: Review of Systems  Constitutional: Negative.   HENT: Negative.   Eyes: Negative.   Respiratory: Negative.  Shortness of breath: exertion.   Cardiovascular: Negative.   Gastrointestinal: Negative.   Genitourinary: Negative.   Musculoskeletal: Negative.   Neurological: Negative.   Psychiatric/Behavioral: Negative.   All other systems reviewed and are negative.  PHYSICAL EXAM: VS:  There were no vitals taken for this visit. , BMI There is no height or weight on file to calculate BMI.  Constitutional:  oriented to person, place, and time. No distress.  HENT:  Head: Grossly normal Eyes:  no discharge. No scleral icterus.  Neck: No JVD, no carotid bruits  Cardiovascular: Irregularly irregular, no murmurs appreciated Trace lower extremity edema bilaterally Pulmonary/Chest: Clear to auscultation bilaterally, no wheezes or rails Abdominal: Soft.  no distension.  no tenderness.  Musculoskeletal: Normal range of motion Neurological:  normal muscle tone. Coordination normal. No atrophy Skin: Skin warm and dry Psychiatric: normal affect,  pleasant  Recent Labs: 10/22/2020: ALT 48; B Natriuretic Peptide 219.9; BUN 111; Creatinine, Ser 5.40; Hemoglobin 15.1; Magnesium 3.5; Platelets 97; Potassium 5.9; Sodium 132; TSH 2.230    Lipid Panel Lab Results  Component Value Date   CHOL 110 01/20/2020   HDL 26 (L) 01/20/2020   LDLCALC 53 01/20/2020   TRIG 153 (H) 01/20/2020    Wt Readings from Last 3 Encounters:  10/22/20 200 lb 9.9 oz (91 kg)  10/09/20 202 lb 6.4 oz (91.8 kg)  10/07/20 200 lb 3.2 oz (90.8 kg)     ASSESSMENT AND PLAN:  Atrial fib, persistent On carvedilol, xarelto On amiodarone, started on his last clinic visit Plan was for cardioversion, with his current lung condition not a candidate for cardioversion May need more antibiotics, scheduled to see Dr. Caryl Comes on 12 October We will set him up for thoracentesis given moderate sized effusion likely contributing to his cough Reassessment in several weeks time for consideration of cardioversion  Atherosclerosis of native coronary artery with stable angina pectoris,  native(HCC)  Recent catheterization  nonischemic cardiomyopathy No further work-up at this time  Hypertension Continue current medications  Cardiomyopathy Markedly depressed ejection fraction 20 to 25% in January now 25 to 30% in sinus rhythm Still in atrial fibrillation Worsening heart failure symptoms, pleural effusion in the setting of renal failure We will continue his current medications Not a candidate for ACE ARB/Entresto On torsemide 20 daily, occasional torsemide 20 twice a day Still with pleural effusion, heart failure symptoms Thoracentesis as above  Mixed hyperlipidemia  Goal LDL less than 70 Numbers at goal  Chronic renal failure, stage 3 (moderate)  Renal numbers getting worse Needs close follow-up with nephrology, [facility]  Shortness of breath Pleural effusion, underlying lung disease, bronchitis Will schedule for thoracentesis   Total encounter time more than 25  minutes  Greater than 50% was spent in counseling and coordination of care with the patient   No orders of the defined types were placed in this encounter.  Signed, Esmond Plants, M.D., Ph.D. 11/01/2020  [place], [place]

## 2020-11-02 ENCOUNTER — Ambulatory Visit: Payer: PPO | Admitting: Cardiovascular Disease

## 2020-11-02 ENCOUNTER — Telehealth: Payer: Self-pay | Admitting: Cardiovascular Disease

## 2020-11-02 NOTE — Telephone Encounter (Signed)
Spoke with patient and daughter to offer our condolences to family in the loss of Mr. Retherford. They both were appreciative for the call and had no further questions at this time.

## 2020-11-13 LAB — FUNGUS CULTURE RESULT

## 2020-11-13 LAB — FUNGUS CULTURE WITH STAIN

## 2020-11-13 LAB — FUNGAL ORGANISM REFLEX

## 2020-11-28 LAB — ACID FAST CULTURE WITH REFLEXED SENSITIVITIES (MYCOBACTERIA): Acid Fast Culture: NEGATIVE

## 2021-06-27 IMAGING — DX DG CHEST 1V PORT
1 series · 1 of 1 positions shown · non-contrast
Comparison: 09/29/2020

CLINICAL DATA: Status post right thoracentesis.

EXAM:
PORTABLE CHEST 1 VIEW

[chest ap]
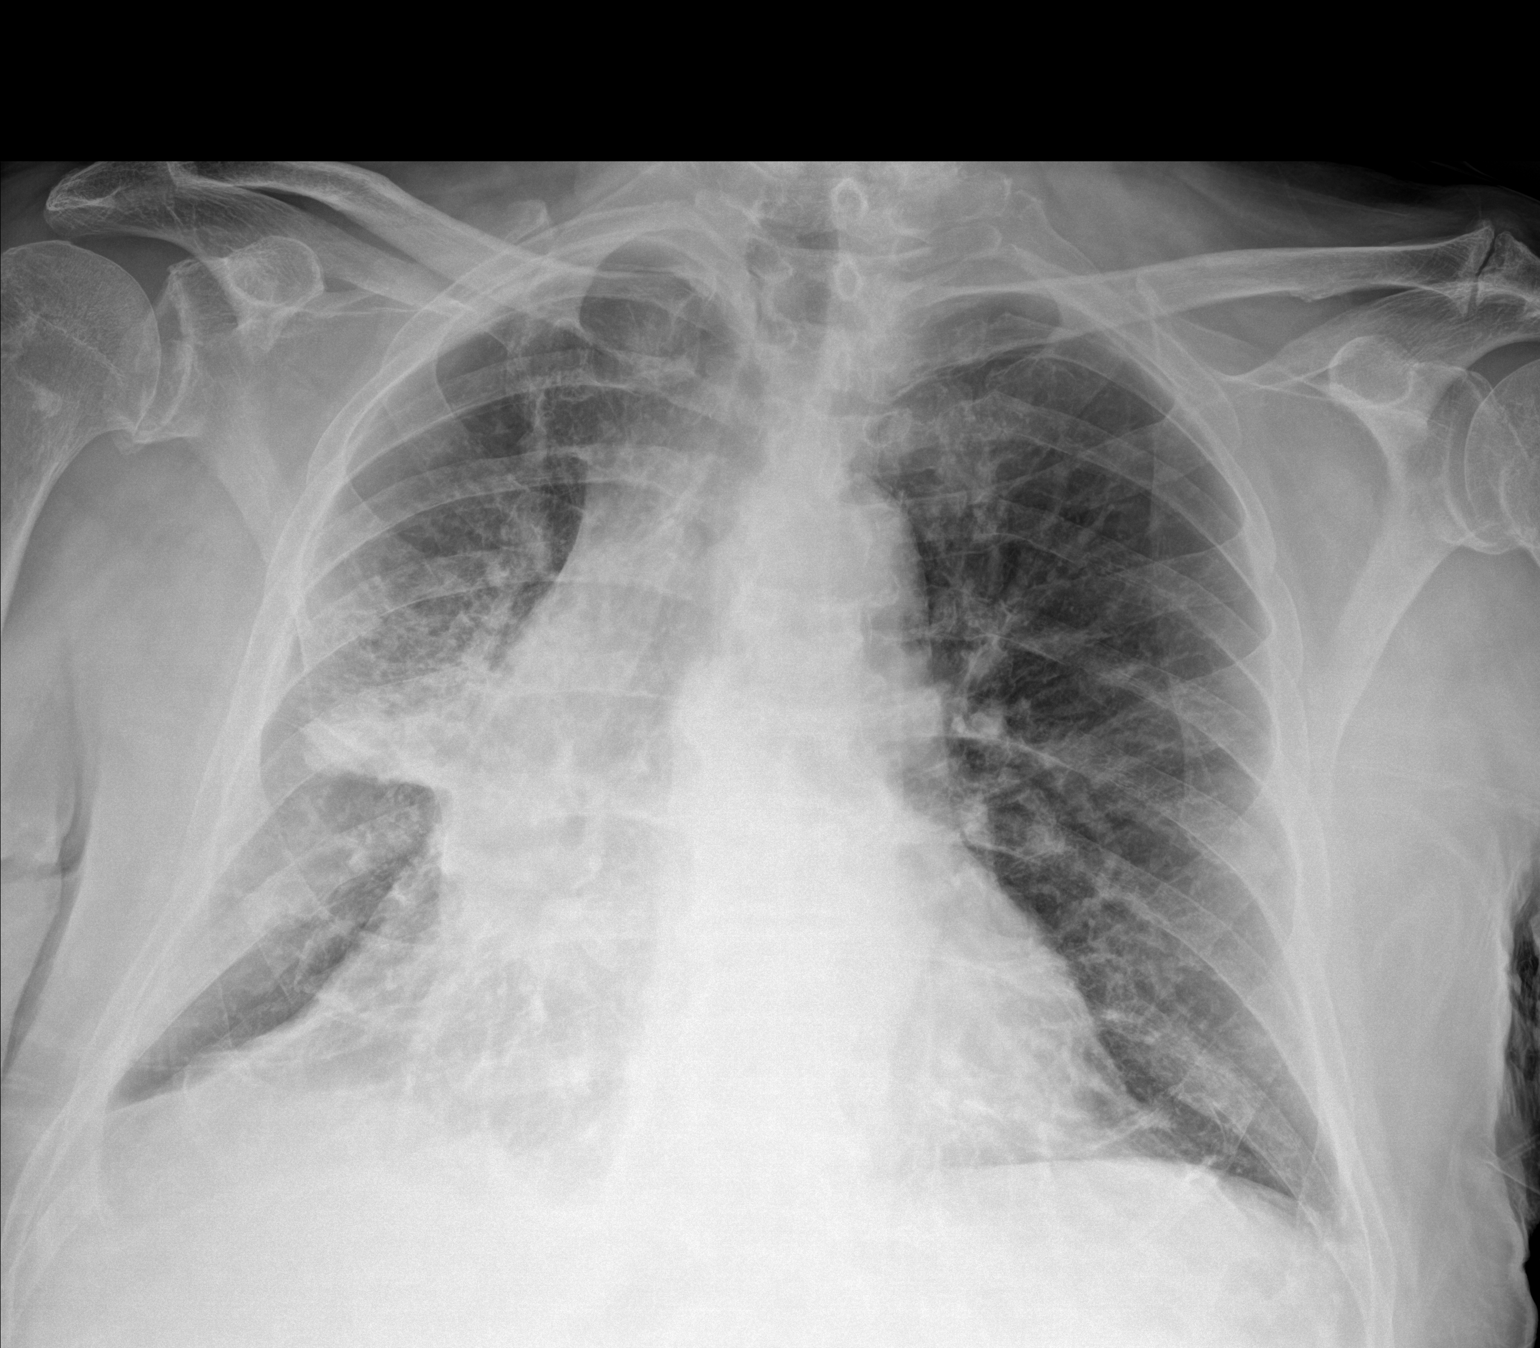

[1 of 1 positions shown; findings below may reference images not displayed]

FINDINGS: No pneumothorax after right thoracentesis. No significant residual
pleural fluid by x-ray. Persistent opacity in the right perihilar
lung is suspicious for malignancy. No edema.
IMPRESSION: No pneumothorax after right thoracentesis. No significant residual
right pleural fluid by x-ray. Persistent right perihilar lung
opacity suspicious for malignancy.

## 2021-07-03 IMAGING — CT NM PET TUM IMG INITIAL (PI) SKULL BASE T - THIGH
9 series · 14 of 25 positions shown · non-contrast
Comparison: Chest CT of September 26, 2020

CLINICAL DATA: Initial treatment strategy for adenocarcinoma of the
RIGHT lung.

EXAM:
NUCLEAR MEDICINE PET SKULL BASE TO THIGH
TECHNIQUE: 10.97 mCi F-18 FDG was injected intravenously. Full-ring PET imaging
was performed from the skull base to thigh after the radiotracer. CT
data was obtained and used for attenuation correction and anatomic
localization.
Fasting blood glucose: 102 mg/dl

[Series 3: ct wb 5.0 b30f · axial · 5.0mm · 0.98mm/px · z∈[-156,+712]mm · 3 of 290 slices shown]
[im 1/290]
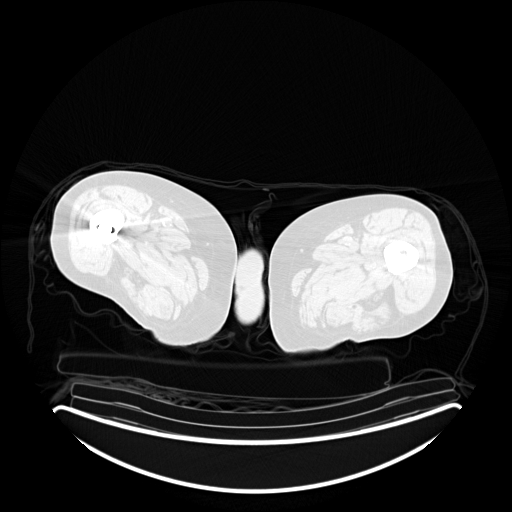
[im 145/290]
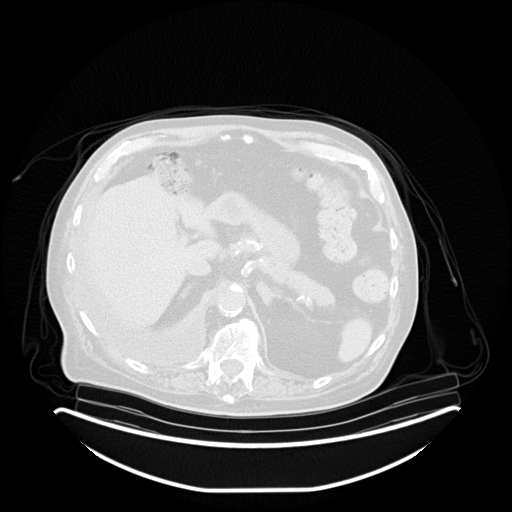
[im 290/290  brain]
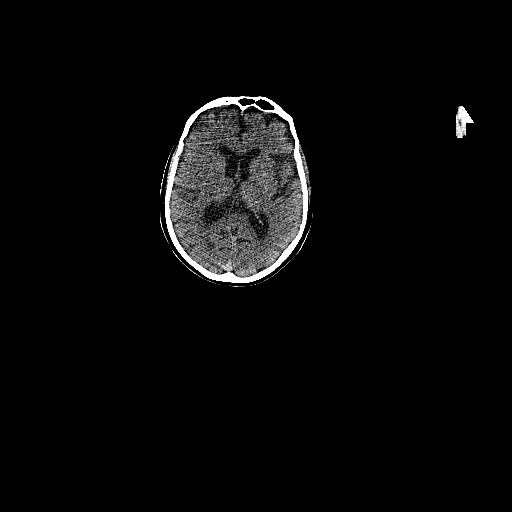

[Series 5: pet wb uncorrected (nac) · axial · 5.0mm · 4.07mm/px · z∈[+132,+712]mm · 2 of 290 slices shown]
[im 97/290]
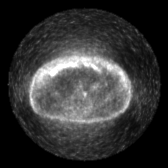
[im 290/290]
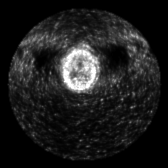

[Series 603: pet_ct axial fused · 2 of 286 slices shown]
[im 1/286]
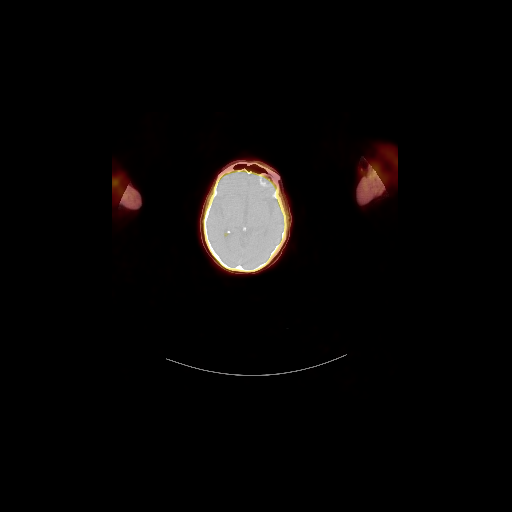
[im 191/286]
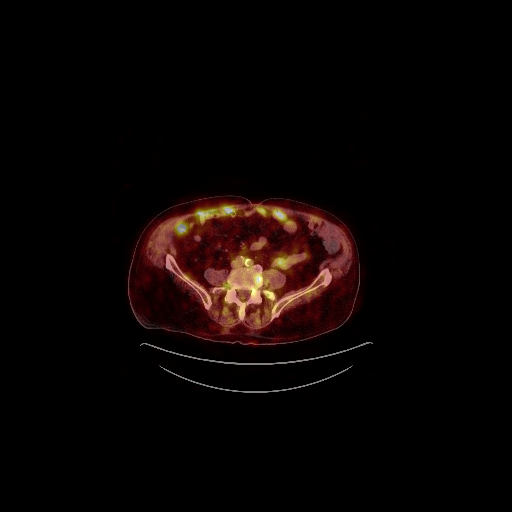

[Series 604: pet_ct coronal fused · 1 of 80 slices shown]
[im 1/80]
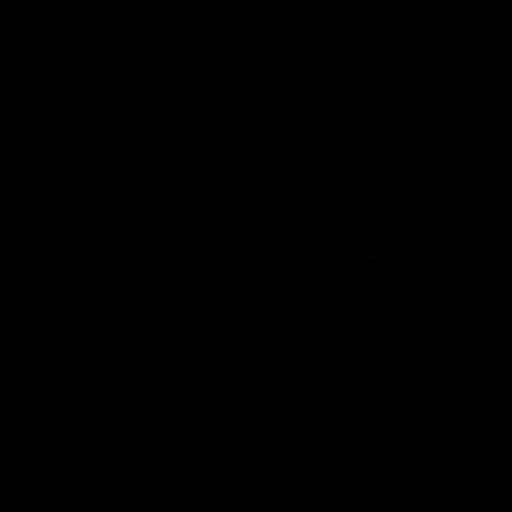

[Series 605: pet_ct sagittal fused · 1 of 152 slices shown]
[im 152/152]
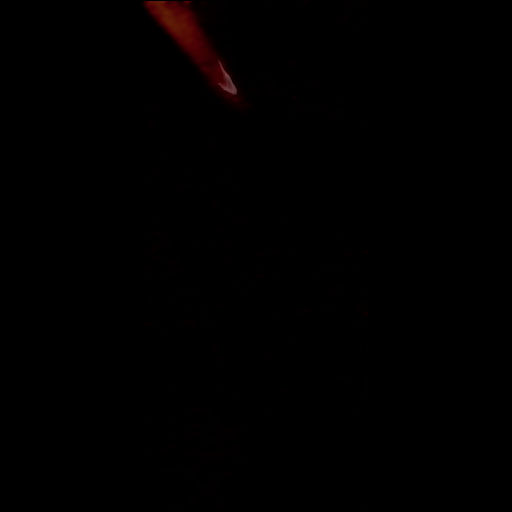

[Series 606: pet axial · 2 of 289 slices shown]
[im 1/289]
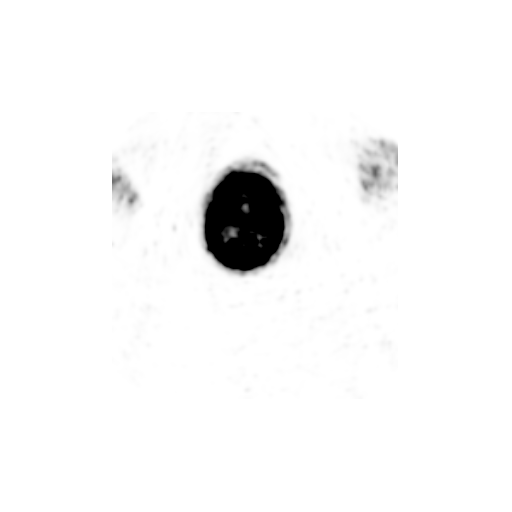
[im 193/289]
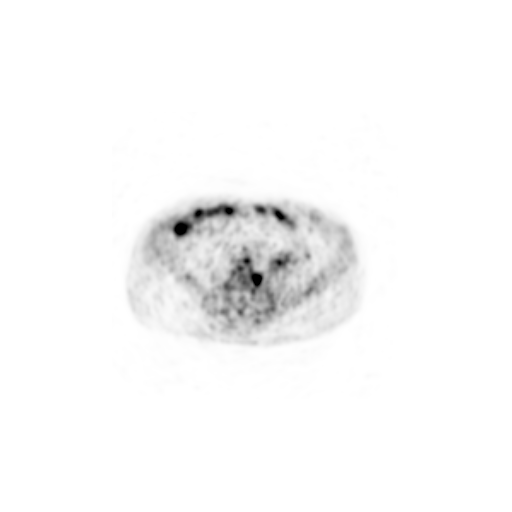

[Series 607: pet coronal · 1 of 129 slices shown]
[im 1/129]
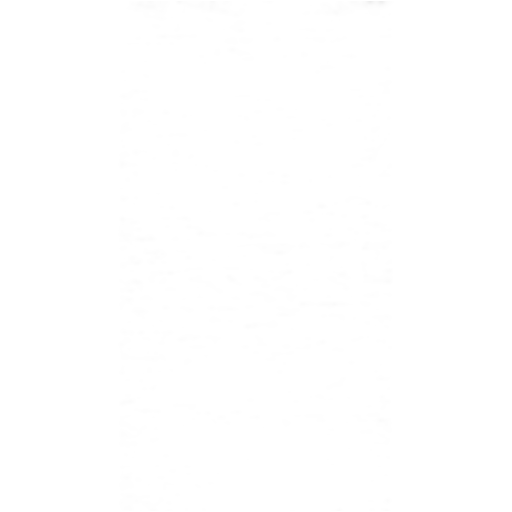

[Series 608: pet sagittal · 1 of 162 slices shown]
[im 1/162]
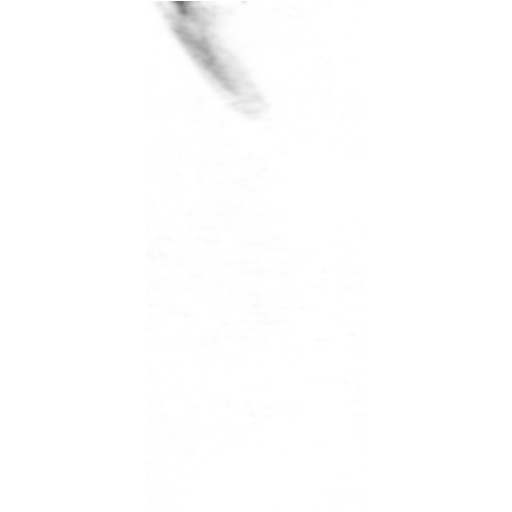

[Series 1056: results mm oncology reading · 1.0mm · 0.50mm/px · 1 of 19 slices shown]
[im 1/19]
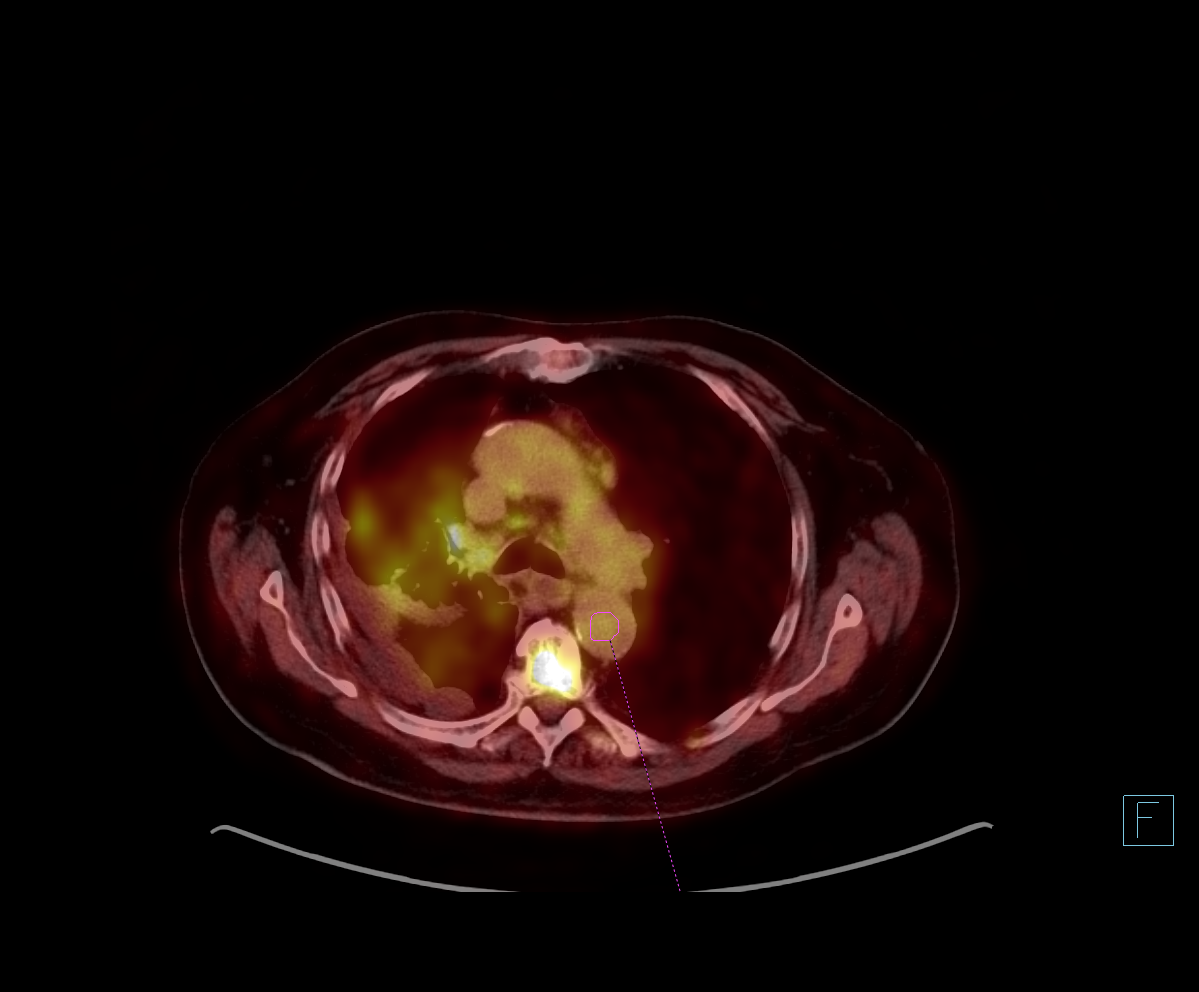

[14 of 25 positions shown; findings below may reference images not displayed]

FINDINGS: Mediastinal blood pool activity: SUV max

Liver activity: SUV max NA

NECK: Hypermetabolic LEFT supraclavicular lymph node (image 59,
series 3) approximately 6 mm with SUV value of approximately

Small RIGHT level 2 lymph node (image 38, series 3) (SUVmax = 3.2)

Another adjacent subcentimeter RIGHT level 2 lymph node with similar
FDG uptake.

Incidental CT findings: none

CHEST: Extensive increased FDG uptake throughout the chest.

Diffuse septal thickening in the RIGHT perihilar region involving
RIGHT upper lobe and RIGHT middle lobe. Nodule in the RIGHT upper
lobe on image 88 of series 3 is intensely FDG avid with an SUV of
8.9

FDG uptake is seen that is above blood pool in other areas of
consolidative change in septal thickening throughout the RIGHT
chest., for instance on image 101 of series 3 consolidative areas
extending anteriorly into the RIGHT upper lobe with an SUV of

RIGHT hilar adenopathy (image 99, series 3) (SUVmax = 9.0)

Subcarinal adenopathy with increased FDG uptake as well (image 99,
series 3) 8 mm with a maximum SUV of

Unenlarged but mildly hypermetabolic lymph nodes elsewhere in the
chest. Focal hypermetabolic activity on image 104 of series 3
corresponds to the esophagus without definite visible lesion in this
area, perhaps a small lymph node or small esophageal lesion with a
maximum SUV of 5.9. There is subtle change in density at the level
of increased metabolic activity of uncertain significance.

Scattered nonspecific LEFT axillary lymph nodes with mild increased
FDG uptake.

Incidental CT findings: Calcified atheromatous plaque in the
thoracic aorta. Three-vessel coronary artery disease. Heart size
mildly enlarged without pericardial effusion.

Diffuse nodular septal thickening in the RIGHT chest and areas of
consolidative change.

Moderate RIGHT-sided loculated pleural fluid.  Pulmonary emphysema.

Small nodule in the LEFT lung base 6 mm without definitive signs of
FDG uptake, certainly not above blood pool.

ABDOMEN/PELVIS: Nodular contour of the liver. Focal area of
increased FDG uptake near the dome of the RIGHT hemi liver (image
31, series 3) (SUVmax = 8.8)

Diffuse increased FDG uptake throughout the nodular appearing LEFT
hepatic lobe without clear signs of discrete mass on noncontrast
imaging. Similar pattern in the RIGHT hepatic lobe posteriorly
mainly hepatic subsegment VII and VI also without discrete focal
hepatic lesion aside from a single lesion on image 157 of series 3.
Note that for reference what appears to represent normal background
hepatic activity is 4.8 with 10.2 maximum SUV in posterior RIGHT
hepatic lobe and similar FDG uptake in the LEFT hepatic lobe.

LEFT adrenal nodularity with intense FDG uptake, nodularity on the
RIGHT with less FDG uptake. LEFT adrenal lesion approximately 13 mm
with a maximum SUV of 6.8 no adenopathy in the abdomen with
increased metabolic activity. No adenopathy in the pelvis.

Incidental CT findings: Cystic renal lesions and lesions of varying
complexity of the bilateral kidneys without signs of focal,
suspicious FDG uptake. Mild parenchymal thinning. No hydronephrosis.
No pericholecystic stranding. Normal spleen. Normal pancreas.
Calcified atheromatous plaque in the abdominal aorta. No aneurysmal
dilation. Prostate grossly unremarkable though obscured by streak
artifact from bilateral hip arthroplasty changes.

SKELETON: Diffuse bony metastatic disease. Multifocal lesions in
bilateral ribs. Lesions at multiple levels throughout the spine with
involvement of T1 through T12 mainly in anterior elements. Posterior
elements at T5 and T6 are involved in also at T12. All lumbar levels
with varying degrees of involvement with diffuse involvement of L2,
L3 and in particular.

Diffuse involvement of the body of the sternum with a maximum SUV of
8.1. T10 with maximum SUV of 8.2. L2 with a maximum SUV of

Diffuse central upper and RIGHT sacral involvement (image 212) on
fused dataset. Subtle heterogeneity in this location without
discrete lesion. Maximum SUV of approximately 8.2. Scattered lesions
throughout the remainder of the pelvis, LEFT iliac along the LEFT
iliac wing and RIGHT posterior iliac bone.

Many of the above lesions display subtly lytic characteristics or
subtly sclerotic characteristics but many are nearly occult from a
radiographic perspective on CT images.

Chronic appearing L1 compression deformity.

Incidental CT findings: Multilevel spinal degenerative changes.
IMPRESSION: 1. Findings that are suspicious for pulmonary neoplasm with
lymphangitic carcinomatosis, neck and chest adenopathy, diffuse bony
metastatic disease and metastatic disease to the adrenals and
potentially liver.
2. Areas of diffuse uptake without discrete lesion but with a single
lesion that can be visualized on CT images in the RIGHT hepatic
lobe. Infiltrative neoplasm is considered. Consider follow-up MRI of
the liver for further assessment. Nodularity of the liver also
raising the question of background liver disease.
3. Focal area of increased uptake in the esophagus may represent a
small lymph node adjacent to the esophagus. There is subtle increase
in soft tissue without definite lesion in this location. Dedicated
esophageal evaluation may be helpful as warranted.
4. C2 with increased FDG uptake, also some FDG uptake associated
with degenerative changes in the middle lower cervical spine maximum
SUV at C2 is

These results will be called to the ordering clinician or
representative by the Radiologist Assistant, and communication
documented in the PACS or [REDACTED].
